# Patient Record
Sex: Female | Born: 1935 | Race: White | Hispanic: No | Smoking: Never smoker
Health system: Southern US, Community
[De-identification: ages and names within clinical notes are randomized; demographics above are authoritative.]

## PROBLEM LIST (undated history)

## (undated) DIAGNOSIS — I10 Essential (primary) hypertension: Secondary | ICD-10-CM

## (undated) DIAGNOSIS — D649 Anemia, unspecified: Secondary | ICD-10-CM

## (undated) DIAGNOSIS — M1990 Unspecified osteoarthritis, unspecified site: Secondary | ICD-10-CM

## (undated) DIAGNOSIS — E785 Hyperlipidemia, unspecified: Secondary | ICD-10-CM

## (undated) DIAGNOSIS — I4891 Unspecified atrial fibrillation: Secondary | ICD-10-CM

## (undated) DIAGNOSIS — Z8719 Personal history of other diseases of the digestive system: Secondary | ICD-10-CM

## (undated) DIAGNOSIS — K219 Gastro-esophageal reflux disease without esophagitis: Secondary | ICD-10-CM

## (undated) DIAGNOSIS — R011 Cardiac murmur, unspecified: Secondary | ICD-10-CM

## (undated) DIAGNOSIS — R251 Tremor, unspecified: Secondary | ICD-10-CM

## (undated) HISTORY — DX: Tremor, unspecified: R25.1

## (undated) HISTORY — PX: JOINT REPLACEMENT: SHX530

## (undated) HISTORY — DX: Anemia, unspecified: D64.9

## (undated) HISTORY — DX: Cardiac murmur, unspecified: R01.1

## (undated) HISTORY — PX: TUBAL LIGATION: SHX77

## (undated) HISTORY — DX: Unspecified osteoarthritis, unspecified site: M19.90

## (undated) HISTORY — DX: Personal history of other diseases of the digestive system: Z87.19

---

## 2005-03-22 ENCOUNTER — Ambulatory Visit: Payer: Self-pay | Admitting: Obstetrics and Gynecology

## 2006-03-27 ENCOUNTER — Ambulatory Visit: Payer: Self-pay | Admitting: Family Medicine

## 2007-04-02 ENCOUNTER — Ambulatory Visit: Payer: Self-pay | Admitting: Family Medicine

## 2008-03-16 ENCOUNTER — Emergency Department: Payer: Self-pay | Admitting: Emergency Medicine

## 2008-03-23 ENCOUNTER — Ambulatory Visit: Payer: Self-pay | Admitting: Unknown Physician Specialty

## 2008-04-02 ENCOUNTER — Ambulatory Visit: Payer: Self-pay | Admitting: Family Medicine

## 2009-04-05 ENCOUNTER — Ambulatory Visit: Payer: Self-pay | Admitting: Family Medicine

## 2009-10-11 ENCOUNTER — Inpatient Hospital Stay: Payer: Self-pay | Admitting: Internal Medicine

## 2010-04-06 ENCOUNTER — Ambulatory Visit: Payer: Self-pay | Admitting: Family Medicine

## 2011-02-18 ENCOUNTER — Emergency Department: Payer: Self-pay | Admitting: Emergency Medicine

## 2011-05-17 ENCOUNTER — Ambulatory Visit: Payer: Self-pay | Admitting: Family Medicine

## 2012-11-04 ENCOUNTER — Ambulatory Visit: Payer: Self-pay | Admitting: Ophthalmology

## 2012-11-04 DIAGNOSIS — I10 Essential (primary) hypertension: Secondary | ICD-10-CM

## 2012-11-04 LAB — POTASSIUM: Potassium: 3.4 mmol/L — ABNORMAL LOW (ref 3.5–5.1)

## 2012-11-13 ENCOUNTER — Ambulatory Visit: Payer: Self-pay | Admitting: Ophthalmology

## 2013-12-03 ENCOUNTER — Ambulatory Visit: Payer: Self-pay | Admitting: General Practice

## 2013-12-03 LAB — BASIC METABOLIC PANEL
ANION GAP: 13 (ref 7–16)
BUN: 23 mg/dL — AB (ref 7–18)
CHLORIDE: 105 mmol/L (ref 98–107)
CO2: 23 mmol/L (ref 21–32)
Calcium, Total: 9.7 mg/dL (ref 8.5–10.1)
Creatinine: 1.18 mg/dL (ref 0.60–1.30)
EGFR (African American): 52 — ABNORMAL LOW
GFR CALC NON AF AMER: 45 — AB
Glucose: 88 mg/dL (ref 65–99)
Osmolality: 284 (ref 275–301)
POTASSIUM: 3.4 mmol/L — AB (ref 3.5–5.1)
Sodium: 141 mmol/L (ref 136–145)

## 2013-12-03 LAB — APTT: Activated PTT: 29.7 secs (ref 23.6–35.9)

## 2013-12-03 LAB — SEDIMENTATION RATE: Erythrocyte Sed Rate: 28 mm/hr (ref 0–30)

## 2013-12-03 LAB — URINALYSIS, COMPLETE
BILIRUBIN, UR: NEGATIVE
Blood: NEGATIVE
GLUCOSE, UR: NEGATIVE mg/dL (ref 0–75)
Ketone: NEGATIVE
Leukocyte Esterase: NEGATIVE
Nitrite: NEGATIVE
PH: 6 (ref 4.5–8.0)
Protein: NEGATIVE
RBC,UR: 1 /HPF (ref 0–5)
Specific Gravity: 1.009 (ref 1.003–1.030)
WBC UR: 3 /HPF (ref 0–5)

## 2013-12-03 LAB — CBC
HCT: 28.8 % — ABNORMAL LOW (ref 35.0–47.0)
HGB: 9.6 g/dL — AB (ref 12.0–16.0)
MCH: 34.1 pg — ABNORMAL HIGH (ref 26.0–34.0)
MCHC: 33.4 g/dL (ref 32.0–36.0)
MCV: 102 fL — ABNORMAL HIGH (ref 80–100)
Platelet: 213 10*3/uL (ref 150–440)
RBC: 2.82 10*6/uL — ABNORMAL LOW (ref 3.80–5.20)
RDW: 15 % — ABNORMAL HIGH (ref 11.5–14.5)
WBC: 8.7 10*3/uL (ref 3.6–11.0)

## 2013-12-03 LAB — PROTIME-INR
INR: 1
Prothrombin Time: 13.2 secs (ref 11.5–14.7)

## 2013-12-03 LAB — MRSA PCR SCREENING

## 2013-12-05 LAB — URINE CULTURE

## 2013-12-09 DIAGNOSIS — E538 Deficiency of other specified B group vitamins: Secondary | ICD-10-CM | POA: Insufficient documentation

## 2013-12-14 ENCOUNTER — Observation Stay: Payer: Self-pay | Admitting: Internal Medicine

## 2013-12-14 LAB — CBC
HCT: 30.1 % — ABNORMAL LOW (ref 35.0–47.0)
HGB: 10.1 g/dL — ABNORMAL LOW (ref 12.0–16.0)
MCH: 34.4 pg — AB (ref 26.0–34.0)
MCHC: 33.4 g/dL (ref 32.0–36.0)
MCV: 103 fL — ABNORMAL HIGH (ref 80–100)
Platelet: 250 10*3/uL (ref 150–440)
RBC: 2.92 10*6/uL — ABNORMAL LOW (ref 3.80–5.20)
RDW: 14.7 % — ABNORMAL HIGH (ref 11.5–14.5)
WBC: 6.8 10*3/uL (ref 3.6–11.0)

## 2013-12-14 LAB — COMPREHENSIVE METABOLIC PANEL
ALBUMIN: 3.7 g/dL (ref 3.4–5.0)
AST: 21 U/L (ref 15–37)
Alkaline Phosphatase: 51 U/L
Anion Gap: 10 (ref 7–16)
BUN: 24 mg/dL — AB (ref 7–18)
Bilirubin,Total: 0.5 mg/dL (ref 0.2–1.0)
CO2: 23 mmol/L (ref 21–32)
Calcium, Total: 9.2 mg/dL (ref 8.5–10.1)
Chloride: 96 mmol/L — ABNORMAL LOW (ref 98–107)
Creatinine: 1.62 mg/dL — ABNORMAL HIGH (ref 0.60–1.30)
EGFR (African American): 35 — ABNORMAL LOW
GFR CALC NON AF AMER: 31 — AB
Glucose: 124 mg/dL — ABNORMAL HIGH (ref 65–99)
OSMOLALITY: 264 (ref 275–301)
Potassium: 3.6 mmol/L (ref 3.5–5.1)
SGPT (ALT): 17 U/L (ref 12–78)
Sodium: 129 mmol/L — ABNORMAL LOW (ref 136–145)
TOTAL PROTEIN: 7.4 g/dL (ref 6.4–8.2)

## 2013-12-14 LAB — URINALYSIS, COMPLETE
BLOOD: NEGATIVE
Bacteria: NONE SEEN
Bilirubin,UR: NEGATIVE
GLUCOSE, UR: NEGATIVE mg/dL (ref 0–75)
Leukocyte Esterase: NEGATIVE
NITRITE: NEGATIVE
PROTEIN: NEGATIVE
Ph: 6 (ref 4.5–8.0)
Specific Gravity: 1.012 (ref 1.003–1.030)
Squamous Epithelial: 1
WBC UR: 1 /HPF (ref 0–5)

## 2013-12-14 LAB — PROTIME-INR
INR: 1
Prothrombin Time: 12.8 secs (ref 11.5–14.7)

## 2013-12-14 LAB — TROPONIN I
Troponin-I: 0.03 ng/mL
Troponin-I: 0.03 ng/mL

## 2013-12-14 LAB — TSH: Thyroid Stimulating Horm: 3.78 u[IU]/mL

## 2013-12-14 LAB — PRO B NATRIURETIC PEPTIDE: B-Type Natriuretic Peptide: 468 pg/mL — ABNORMAL HIGH (ref 0–450)

## 2013-12-14 LAB — MAGNESIUM: Magnesium: 1.3 mg/dL — ABNORMAL LOW

## 2013-12-15 LAB — CBC WITH DIFFERENTIAL/PLATELET
BASOS ABS: 0 10*3/uL (ref 0.0–0.1)
Basophil %: 0.4 %
EOS PCT: 0.5 %
Eosinophil #: 0 10*3/uL (ref 0.0–0.7)
HCT: 25 % — ABNORMAL LOW (ref 35.0–47.0)
HGB: 8.7 g/dL — ABNORMAL LOW (ref 12.0–16.0)
LYMPHS PCT: 27.8 %
Lymphocyte #: 1.3 10*3/uL (ref 1.0–3.6)
MCH: 35.6 pg — ABNORMAL HIGH (ref 26.0–34.0)
MCHC: 34.9 g/dL (ref 32.0–36.0)
MCV: 102 fL — ABNORMAL HIGH (ref 80–100)
Monocyte #: 1 x10 3/mm — ABNORMAL HIGH (ref 0.2–0.9)
Monocyte %: 21.7 %
Neutrophil #: 2.4 10*3/uL (ref 1.4–6.5)
Neutrophil %: 49.6 %
PLATELETS: 211 10*3/uL (ref 150–440)
RBC: 2.45 10*6/uL — ABNORMAL LOW (ref 3.80–5.20)
RDW: 14.3 % (ref 11.5–14.5)
WBC: 4.8 10*3/uL (ref 3.6–11.0)

## 2013-12-15 LAB — BASIC METABOLIC PANEL
ANION GAP: 10 (ref 7–16)
BUN: 15 mg/dL (ref 7–18)
CO2: 21 mmol/L (ref 21–32)
Calcium, Total: 8.4 mg/dL — ABNORMAL LOW (ref 8.5–10.1)
Chloride: 104 mmol/L (ref 98–107)
Creatinine: 1.04 mg/dL (ref 0.60–1.30)
EGFR (African American): >60
EGFR (Non-African Amer.): 52 — ABNORMAL LOW
Glucose: 86 mg/dL (ref 65–99)
OSMOLALITY: 270 (ref 275–301)
Potassium: 3.7 mmol/L (ref 3.5–5.1)
Sodium: 135 mmol/L — ABNORMAL LOW (ref 136–145)

## 2013-12-15 LAB — PHOSPHORUS: Phosphorus: 2.9 mg/dL (ref 2.5–4.9)

## 2013-12-15 LAB — MAGNESIUM: Magnesium: 2.2 mg/dL

## 2013-12-15 LAB — URINE CULTURE

## 2013-12-15 LAB — FOLATE: FOLIC ACID: 32.1 ng/mL (ref 3.1–100.0)

## 2013-12-16 LAB — CBC WITH DIFFERENTIAL/PLATELET
BASOS PCT: 0.3 %
Basophil #: 0 10*3/uL (ref 0.0–0.1)
Eosinophil #: 0 10*3/uL (ref 0.0–0.7)
Eosinophil %: 0.2 %
HCT: 25.3 % — ABNORMAL LOW (ref 35.0–47.0)
HGB: 8.6 g/dL — ABNORMAL LOW (ref 12.0–16.0)
Lymphocyte #: 1 10*3/uL (ref 1.0–3.6)
Lymphocyte %: 15.8 %
MCH: 35.3 pg — ABNORMAL HIGH (ref 26.0–34.0)
MCHC: 33.8 g/dL (ref 32.0–36.0)
MCV: 104 fL — ABNORMAL HIGH (ref 80–100)
MONO ABS: 1.2 x10 3/mm — AB (ref 0.2–0.9)
Monocyte %: 18.1 %
Neutrophil #: 4.2 10*3/uL (ref 1.4–6.5)
Neutrophil %: 65.6 %
PLATELETS: 206 10*3/uL (ref 150–440)
RBC: 2.43 10*6/uL — ABNORMAL LOW (ref 3.80–5.20)
RDW: 14.9 % — ABNORMAL HIGH (ref 11.5–14.5)
WBC: 6.4 10*3/uL (ref 3.6–11.0)

## 2013-12-16 LAB — OCCULT BLOOD X 1 CARD TO LAB, STOOL: Occult Blood, Feces: POSITIVE

## 2013-12-16 LAB — BASIC METABOLIC PANEL
ANION GAP: 9 (ref 7–16)
BUN: 15 mg/dL (ref 7–18)
CO2: 22 mmol/L (ref 21–32)
Calcium, Total: 9 mg/dL (ref 8.5–10.1)
Chloride: 101 mmol/L (ref 98–107)
Creatinine: 0.91 mg/dL (ref 0.60–1.30)
EGFR (Non-African Amer.): >60
Glucose: 92 mg/dL (ref 65–99)
Osmolality: 265 (ref 275–301)
POTASSIUM: 3.9 mmol/L (ref 3.5–5.1)
Sodium: 132 mmol/L — ABNORMAL LOW (ref 136–145)

## 2013-12-16 LAB — RETICULOCYTES
ABSOLUTE RETIC COUNT: 0.0453 10*6/uL (ref 0.019–0.186)
Reticulocyte: 1.84 % (ref 0.4–3.1)

## 2013-12-19 LAB — CULTURE, BLOOD (SINGLE)

## 2014-01-01 ENCOUNTER — Ambulatory Visit: Payer: Self-pay | Admitting: Gastroenterology

## 2014-01-13 ENCOUNTER — Ambulatory Visit: Payer: Self-pay | Admitting: Gastroenterology

## 2014-01-16 ENCOUNTER — Ambulatory Visit: Payer: Self-pay | Admitting: Gastroenterology

## 2014-04-14 ENCOUNTER — Ambulatory Visit: Payer: Self-pay | Admitting: General Practice

## 2014-04-14 LAB — BASIC METABOLIC PANEL
Anion Gap: 8 (ref 7–16)
BUN: 27 mg/dL — ABNORMAL HIGH (ref 7–18)
CALCIUM: 9.5 mg/dL (ref 8.5–10.1)
CO2: 27 mmol/L (ref 21–32)
Chloride: 107 mmol/L (ref 98–107)
Creatinine: 0.8 mg/dL (ref 0.60–1.30)
GLUCOSE: 89 mg/dL (ref 65–99)
OSMOLALITY: 288 (ref 275–301)
Potassium: 3.5 mmol/L (ref 3.5–5.1)
SODIUM: 142 mmol/L (ref 136–145)

## 2014-04-14 LAB — URINALYSIS, COMPLETE
Bacteria: NONE SEEN
Bilirubin,UR: NEGATIVE
Blood: NEGATIVE
Glucose,UR: NEGATIVE mg/dL (ref 0–75)
KETONE: NEGATIVE
LEUKOCYTE ESTERASE: NEGATIVE
Nitrite: NEGATIVE
PH: 5 (ref 4.5–8.0)
PROTEIN: NEGATIVE
RBC,UR: 2 /HPF (ref 0–5)
SPECIFIC GRAVITY: 1.016 (ref 1.003–1.030)
WBC UR: <1 /HPF (ref 0–5)

## 2014-04-14 LAB — CBC
HCT: 31.2 % — AB (ref 35.0–47.0)
HGB: 10.3 g/dL — AB (ref 12.0–16.0)
MCH: 35.5 pg — ABNORMAL HIGH (ref 26.0–34.0)
MCHC: 33.2 g/dL (ref 32.0–36.0)
MCV: 107 fL — ABNORMAL HIGH (ref 80–100)
Platelet: 259 10*3/uL (ref 150–440)
RBC: 2.91 10*6/uL — ABNORMAL LOW (ref 3.80–5.20)
RDW: 17.3 % — ABNORMAL HIGH (ref 11.5–14.5)
WBC: 9.3 10*3/uL (ref 3.6–11.0)

## 2014-04-14 LAB — MRSA PCR SCREENING

## 2014-04-14 LAB — SEDIMENTATION RATE: ERYTHROCYTE SED RATE: 59 mm/h — AB (ref 0–30)

## 2014-04-14 LAB — PROTIME-INR
INR: 0.9
Prothrombin Time: 12.5 secs (ref 11.5–14.7)

## 2014-04-14 LAB — APTT: ACTIVATED PTT: 27.3 s (ref 23.6–35.9)

## 2014-04-16 LAB — URINE CULTURE

## 2014-04-27 ENCOUNTER — Inpatient Hospital Stay: Payer: Self-pay | Admitting: General Practice

## 2014-04-27 LAB — CBC WITH DIFFERENTIAL/PLATELET
Basophil #: 0 10*3/uL (ref 0.0–0.1)
Basophil %: 0.1 %
Eosinophil #: 0 10*3/uL (ref 0.0–0.7)
Eosinophil %: 0 %
HCT: 25 % — ABNORMAL LOW (ref 35.0–47.0)
HGB: 8.3 g/dL — AB (ref 12.0–16.0)
LYMPHS PCT: 2.3 %
Lymphocyte #: 0.3 10*3/uL — ABNORMAL LOW (ref 1.0–3.6)
MCH: 35.5 pg — AB (ref 26.0–34.0)
MCHC: 33 g/dL (ref 32.0–36.0)
MCV: 108 fL — ABNORMAL HIGH (ref 80–100)
MONO ABS: 1.8 x10 3/mm — AB (ref 0.2–0.9)
MONOS PCT: 15.2 %
NEUTROS ABS: 9.7 10*3/uL — AB (ref 1.4–6.5)
NEUTROS PCT: 82.4 %
Platelet: 128 10*3/uL — ABNORMAL LOW (ref 150–440)
RBC: 2.33 10*6/uL — ABNORMAL LOW (ref 3.80–5.20)
RDW: 17 % — AB (ref 11.5–14.5)
WBC: 11.8 10*3/uL — ABNORMAL HIGH (ref 3.6–11.0)

## 2014-04-27 LAB — BASIC METABOLIC PANEL
ANION GAP: 7 (ref 7–16)
BUN: 19 mg/dL — ABNORMAL HIGH (ref 7–18)
CREATININE: 0.76 mg/dL (ref 0.60–1.30)
Calcium, Total: 8 mg/dL — ABNORMAL LOW (ref 8.5–10.1)
Chloride: 108 mmol/L — ABNORMAL HIGH (ref 98–107)
Co2: 25 mmol/L (ref 21–32)
GLUCOSE: 134 mg/dL — AB (ref 65–99)
Osmolality: 284 (ref 275–301)
POTASSIUM: 3.9 mmol/L (ref 3.5–5.1)
Sodium: 140 mmol/L (ref 136–145)

## 2014-04-27 LAB — MAGNESIUM: Magnesium: 1.2 mg/dL — ABNORMAL LOW

## 2014-04-28 LAB — PLATELET COUNT: Platelet: 135 10*3/uL — ABNORMAL LOW (ref 150–440)

## 2014-04-28 LAB — BASIC METABOLIC PANEL
ANION GAP: 8 (ref 7–16)
BUN: 16 mg/dL (ref 7–18)
CALCIUM: 8 mg/dL — AB (ref 8.5–10.1)
CO2: 25 mmol/L (ref 21–32)
CREATININE: 0.61 mg/dL (ref 0.60–1.30)
Chloride: 104 mmol/L (ref 98–107)
EGFR (African American): >60
GLUCOSE: 99 mg/dL (ref 65–99)
Osmolality: 275 (ref 275–301)
Potassium: 3.9 mmol/L (ref 3.5–5.1)
Sodium: 137 mmol/L (ref 136–145)

## 2014-04-28 LAB — HEPATIC FUNCTION PANEL A (ARMC)
ALBUMIN: 2.9 g/dL — AB (ref 3.4–5.0)
Alkaline Phosphatase: 44 U/L — ABNORMAL LOW
Bilirubin,Total: 0.2 mg/dL (ref 0.2–1.0)
SGOT(AST): 26 U/L (ref 15–37)
SGPT (ALT): 19 U/L
TOTAL PROTEIN: 5.9 g/dL — AB (ref 6.4–8.2)

## 2014-04-28 LAB — HEMOGLOBIN: HGB: 8.1 g/dL — ABNORMAL LOW (ref 12.0–16.0)

## 2014-04-28 LAB — MAGNESIUM: MAGNESIUM: 1.8 mg/dL

## 2014-04-28 LAB — FOLATE: FOLIC ACID: 35 ng/mL (ref 3.1–100.0)

## 2014-04-29 LAB — CBC WITH DIFFERENTIAL/PLATELET
Basophil #: 0 10*3/uL (ref 0.0–0.1)
Basophil %: 0.2 %
EOS ABS: 0 10*3/uL (ref 0.0–0.7)
EOS PCT: 0.2 %
HCT: 25.3 % — AB (ref 35.0–47.0)
HGB: 8.4 g/dL — ABNORMAL LOW (ref 12.0–16.0)
LYMPHS PCT: 8.1 %
Lymphocyte #: 0.9 10*3/uL — ABNORMAL LOW (ref 1.0–3.6)
MCH: 35.7 pg — ABNORMAL HIGH (ref 26.0–34.0)
MCHC: 33 g/dL (ref 32.0–36.0)
MCV: 108 fL — AB (ref 80–100)
Monocyte #: 3.6 x10 3/mm — ABNORMAL HIGH (ref 0.2–0.9)
Monocyte %: 31.9 %
Neutrophil #: 6.8 10*3/uL — ABNORMAL HIGH (ref 1.4–6.5)
Neutrophil %: 59.6 %
PLATELETS: 149 10*3/uL — AB (ref 150–440)
RBC: 2.34 10*6/uL — AB (ref 3.80–5.20)
RDW: 17.5 % — ABNORMAL HIGH (ref 11.5–14.5)
WBC: 11.4 10*3/uL — ABNORMAL HIGH (ref 3.6–11.0)

## 2014-04-29 LAB — BASIC METABOLIC PANEL
ANION GAP: 7 (ref 7–16)
BUN: 10 mg/dL (ref 7–18)
CALCIUM: 8.3 mg/dL — AB (ref 8.5–10.1)
Chloride: 106 mmol/L (ref 98–107)
Co2: 26 mmol/L (ref 21–32)
Creatinine: 0.65 mg/dL (ref 0.60–1.30)
EGFR (Non-African Amer.): >60
Glucose: 94 mg/dL (ref 65–99)
Osmolality: 276 (ref 275–301)
Potassium: 3.7 mmol/L (ref 3.5–5.1)
Sodium: 139 mmol/L (ref 136–145)

## 2014-04-30 LAB — CBC WITH DIFFERENTIAL/PLATELET
Basophil #: 0 10*3/uL (ref 0.0–0.1)
Basophil %: 0.4 %
EOS ABS: 0 10*3/uL (ref 0.0–0.7)
Eosinophil %: 0.1 %
HCT: 24.5 % — AB (ref 35.0–47.0)
HGB: 8 g/dL — ABNORMAL LOW (ref 12.0–16.0)
Lymphocyte #: 1.1 10*3/uL (ref 1.0–3.6)
Lymphocyte %: 9.4 %
MCH: 35.4 pg — AB (ref 26.0–34.0)
MCHC: 32.6 g/dL (ref 32.0–36.0)
MCV: 109 fL — AB (ref 80–100)
MONO ABS: 3.5 x10 3/mm — AB (ref 0.2–0.9)
Monocyte %: 31.3 %
NEUTROS ABS: 6.6 10*3/uL — AB (ref 1.4–6.5)
NEUTROS PCT: 58.8 %
Platelet: 144 10*3/uL — ABNORMAL LOW (ref 150–440)
RBC: 2.26 10*6/uL — AB (ref 3.80–5.20)
RDW: 17.1 % — ABNORMAL HIGH (ref 11.5–14.5)
WBC: 11.3 10*3/uL — ABNORMAL HIGH (ref 3.6–11.0)

## 2014-04-30 LAB — BASIC METABOLIC PANEL
Anion Gap: 9 (ref 7–16)
BUN: 10 mg/dL (ref 7–18)
CHLORIDE: 106 mmol/L (ref 98–107)
Calcium, Total: 8.1 mg/dL — ABNORMAL LOW (ref 8.5–10.1)
Co2: 26 mmol/L (ref 21–32)
Creatinine: 0.64 mg/dL (ref 0.60–1.30)
EGFR (African American): >60
Glucose: 94 mg/dL (ref 65–99)
OSMOLALITY: 280 (ref 275–301)
Potassium: 3.4 mmol/L — ABNORMAL LOW (ref 3.5–5.1)
Sodium: 141 mmol/L (ref 136–145)

## 2014-05-01 LAB — BASIC METABOLIC PANEL
Anion Gap: 3 — ABNORMAL LOW (ref 7–16)
BUN: 16 mg/dL (ref 7–18)
CALCIUM: 8.2 mg/dL — AB (ref 8.5–10.1)
CHLORIDE: 107 mmol/L (ref 98–107)
CO2: 31 mmol/L (ref 21–32)
Creatinine: 0.86 mg/dL (ref 0.60–1.30)
EGFR (African American): >60
EGFR (Non-African Amer.): >60
Glucose: 91 mg/dL (ref 65–99)
OSMOLALITY: 282 (ref 275–301)
POTASSIUM: 4 mmol/L (ref 3.5–5.1)
SODIUM: 141 mmol/L (ref 136–145)

## 2014-05-01 LAB — HEMOGLOBIN: HGB: 8.9 g/dL — AB (ref 12.0–16.0)

## 2014-05-01 LAB — PLATELET COUNT: PLATELETS: 194 10*3/uL (ref 150–440)

## 2014-05-01 LAB — MAGNESIUM: MAGNESIUM: 2.1 mg/dL

## 2014-05-02 LAB — BASIC METABOLIC PANEL
ANION GAP: 5 — AB (ref 7–16)
BUN: 14 mg/dL (ref 7–18)
CALCIUM: 8.2 mg/dL — AB (ref 8.5–10.1)
CREATININE: 0.86 mg/dL (ref 0.60–1.30)
Chloride: 106 mmol/L (ref 98–107)
Co2: 30 mmol/L (ref 21–32)
EGFR (African American): >60
EGFR (Non-African Amer.): >60
Glucose: 83 mg/dL (ref 65–99)
OSMOLALITY: 281 (ref 275–301)
POTASSIUM: 3.7 mmol/L (ref 3.5–5.1)
SODIUM: 141 mmol/L (ref 136–145)

## 2014-05-02 LAB — CBC WITH DIFFERENTIAL/PLATELET
Basophil #: 0 10*3/uL (ref 0.0–0.1)
Basophil %: 0.3 %
EOS ABS: 0.1 10*3/uL (ref 0.0–0.7)
Eosinophil %: 1 %
HCT: 23.9 % — AB (ref 35.0–47.0)
HGB: 8.2 g/dL — ABNORMAL LOW (ref 12.0–16.0)
LYMPHS ABS: 1.3 10*3/uL (ref 1.0–3.6)
Lymphocyte %: 16.2 %
MCH: 36 pg — ABNORMAL HIGH (ref 26.0–34.0)
MCHC: 34.1 g/dL (ref 32.0–36.0)
MCV: 105 fL — AB (ref 80–100)
MONO ABS: 2.5 x10 3/mm — AB (ref 0.2–0.9)
Monocyte %: 30.1 %
Neutrophil #: 4.4 10*3/uL (ref 1.4–6.5)
Neutrophil %: 52.4 %
Platelet: 212 10*3/uL (ref 150–440)
RBC: 2.27 10*6/uL — AB (ref 3.80–5.20)
RDW: 17.1 % — ABNORMAL HIGH (ref 11.5–14.5)
WBC: 8.4 10*3/uL (ref 3.6–11.0)

## 2014-05-03 ENCOUNTER — Encounter: Payer: Self-pay | Admitting: Internal Medicine

## 2014-05-05 ENCOUNTER — Encounter: Payer: Self-pay | Admitting: Internal Medicine

## 2014-06-05 ENCOUNTER — Encounter: Payer: Self-pay | Admitting: Internal Medicine

## 2014-09-25 NOTE — Op Note (Signed)
PATIENT NAME:  Vickie Howard, Vickie Howard MR#:  [id number] DATE OF BIRTH:  22-Sep-1935  DATE OF PROCEDURE:  11/13/2012  PREOPERATIVE DIAGNOSIS:  Senile cataract left eye.  POSTOPERATIVE DIAGNOSIS:  Senile cataract left eye.  PROCEDURE:  Phacoemulsification with posterior chamber intraocular lens implantation of the left eye.  LENS: ZCBOO 21.5-diopter posterior chamber intraocular lens.  ULTRASOUND TIME:  15% of 1 minute.  CDE 9.3   SURGEON:  Mali Dee Paden, MD  ANESTHESIA:  Topical with tetracaine drops and 2% Xylocaine jelly.  COMPLICATIONS:  None.  DESCRIPTION OF PROCEDURE:  The patient was identified in the holding room and transported to the operating room and placed in the supine position under the operating microscope.  The left eye was identified as the operative eye and it was prepped and draped in the usual sterile ophthalmic fashion.  A 1 millimeter clear-corneal paracentesis was made at the 1:30 position.  The anterior chamber was filled with Viscoat viscoelastic.  A 2.4 millimeter keratome was used to make a near-clear corneal incision at the 10:30 position.  A curvilinear capsulorrhexis was made with a cystotome and capsulorrhexis forceps.  Balanced salt solution was used to hydrodissect and hydrodelineate the nucleus.  Phacoemulsification was then used in stop and chop fashion to remove the lens nucleus and epinucleus.  The remaining cortex was then removed using the irrigation and aspiration handpiece. Provisc was then placed into the capsular bag to distend it for lens placement.  A ZCBOO 21.5-diopter lens was then injected into the capsular bag.  The remaining viscoelastic was aspirated.  Wounds were hydrated with balanced salt solution.  The anterior chamber was inflated to a physiologic pressure with balanced salt solution.  0.1 mL of cefuroxime 10 mg/mL were injected into the anterior chamber for a dose of 1 mg of intracameral antibiotic at the completion of the case. Miostat was  placed into the anterior chamber to constrict the pupil.  No wound leaks were noted.  Topical Vigamox drops and Maxitrol ointment were applied to the eye.  The patient was taken to the recovery room in stable condition without complications of anesthesia or surgery.   ____________________________ Wyonia Hough, MD crb:OSi D: 11/13/2012 13:03:13 ET T: 11/13/2012 14:54:05 ET JOB#: [id number]  cc: Wyonia Hough, MD, <Dictator> Leandrew Koyanagi MD ELECTRONICALLY SIGNED 11/20/2012 12:08

## 2014-09-26 NOTE — Op Note (Signed)
PATIENT NAME:  Vickie Howard, Vickie Howard MR#:  [id number] DATE OF BIRTH:  1935-11-24  DATE OF PROCEDURE:  04/27/2014  PREOPERATIVE DIAGNOSIS: Degenerative arthrosis of the right hip (primary).   POSTOPERATIVE DIAGNOSIS: Degenerative arthrosis of the right hip (primary).   PROCEDURE PERFORMED: Right total hip arthroplasty.   SURGEON: Skip Estimable, MD.   ASSISTANT:  Vance Peper, PA (required to maintain retraction throughout the procedure).   ANESTHESIA: Spinal.   ESTIMATED BLOOD LOSS: 75 mL.   FLUIDS REPLACED: 1700 mL of crystalloid.   DRAINS: Two medium drains to Hemovac reservoir.   IMPLANTS UTILIZED:  DePuy 15 mm small stature AML femoral stem, 50 mm outer diameter Pinnacle 100 acetabular shell, neutral Pinnacle AltrX polyethylene liner, and a 32 mm cobalt chrome hip ball with a + 1 mm neck length.   INDICATIONS FOR SURGERY: The patient is a 79 year old female who has been seen for complaints of progressive right hip and groin pain. X-rays demonstrated severe degenerative changes. After discussion of the risks and benefits of surgical intervention, the patient expressed understanding of the risks and benefits and agreed with plans for surgical intervention.   PROCEDURE IN DETAIL: The patient was brought into the operating room and, after adequate spinal anesthesia was achieved, the patient was placed in the left lateral decubitus position. Axillary roll was placed and all bony prominences were well padded. The patient's right hip and leg were cleaned and prepped with alcohol and DuraPrep and draped in the usual sterile fashion. A "timeout" was performed as per usual protocol. A lateral curvilinear incision was made gently curving towards the posterior superior iliac spine. IT band was incised in line with the skin incision and the fibers of the gluteus maximus were split in line. Piriformis tendon was identified and skeletonized, then incised at its insertion on the proximal femur and reflected  posteriorly. In a similar fashion, the short external rotators were incised and reflected posteriorly. A T-type posterior capsulotomy was performed. Prior to dislocation of the femoral head, a threaded Steinmann pin was inserted through a separate stab incision into the pelvis superior to the acetabulum, then bent in the form of a stylus so as to assess limb length and hip offset throughout the procedure. The femoral head was then dislocated posteriorly. Inspection of the head demonstrated severe degenerative changes with full-thickness loss of articular cartilage and prominent osteophytes noted. The femoral neck cut was performed using an oscillating saw. The anterior capsule was elevated off of the femoral neck. Inspection of the acetabulum also demonstrated significant degenerative changes. Remnant of the acetabular labrum was excised. The acetabulum was then reamed in a sequential fashion up to a 49 mm diameter. Good punctate bleeding bone was encountered. A 50 mm outer diameter Pinnacle 100 acetabular component was positioned and impacted into place. Good scratch fit was appreciated. A + 4 mm polyethylene trial was inserted and attention was directed to the proximal femur. Pilot hole for reaming of the proximal femoral canal was created using a high-speed burr. Proximal femoral canal was reamed in a sequential fashion up to a 14.5 mm diameter. This allowed for approximately 6.5-7 cm of scratch fit. The proximal femur was then prepared using a 15 mm aggressive side-biting reamer. Serial broaches were inserted up to a 15 mm small stature broach. The calcar region was planed and trial reduction was attempted with a 32 mm hip ball with a + 1 mm neck length. The leg was felt to be too long and the + 4 mm polyethylene  was replaced with a neutral polyethylene trial. The 15 mm broach was countersunk and calcar planer was used to resect additional bone. Trial reduction was again performed with a 32 mm hip ball with a +  1 mm neck length. This allowed for good equalization of limb lengths and improved hip offset. Excellent stability was noted both anteriorly and posteriorly. Trial components were removed. The acetabular shell was irrigated with copious amounts of normal saline, then suctioned dry. A neutral Pinnacle AltrX polyethylene was positioned and impacted into place. Attention was then directed to the proximal femur. It was elected to ream line to line and the 15 mm reamer was advanced by hand with excellent purchase noted over approximately 6.5-7 cm.  The 15 mm small stature AML femoral component was positioned and impacted into place. Excellent scratch fit was appreciated. The Morse taper was cleaned and dried and a 32 mm cobalt chrome hip ball with a + 1 mm neck length was placed on the trunnion and impacted into place. The hip was then reduced and placed through a range of motion. Good equalization of limb lengths was noted and improved hip offset was noted. Excellent stability was noted both anteriorly and posteriorly.   The wound was irrigated with copious amounts of normal saline with antibiotic solution using pulsatile lavage and then suctioned dry. The posterior capsule was repaired using number 5 Ethibond. The piriformis tendon was reapproximated on the surface of the gluteus medius tendon using number 5 Ethibond. Two medium drains were placed in the wound bed and brought out through a separate stab incision to be attached to a Hemovac reservoir. IT band was repaired using interrupted sutures of number 1 Vicryl. The subcutaneous tissue was approximated in layers using first number 0 Vicryl followed by 2-0 Vicryl. Skin was closed with skin staples. Sterile dressing was applied.   The patient tolerated the procedure well. She was transported to the recovery room in stable condition.   ____________________________ Laurice Record. Holley Bouche., MD jph:bu D: 04/27/2014 15:48:56 ET T: 04/27/2014 16:22:25  ET JOB#: [id number]  cc: Laurice Record. Holley Bouche., MD, <Dictator> Laurice Record Holley Bouche MD ELECTRONICALLY SIGNED 04/29/2014 17:47

## 2014-09-26 NOTE — Discharge Summary (Signed)
PATIENT NAME:  Vickie Howard, Vickie Howard MR#:  [id number] DATE OF BIRTH:  1935/08/14  DATE OF ADMISSION:  12/14/2013 DATE OF DISCHARGE:  12/16/2013  FINAL DIAGNOSES: 1.  Atrial fibrillation with rapid ventricular rate.  2.  Anemia.  3.  Heme positive stool, concerning for acute blood loss anemia.  4.  Degenerative joint disease with the patient anticipating hip replacement surgery in the near future.  5.  Hypertension.  6.  Hyperlipidemia.  7.  Gastroesophageal reflux disease.   HISTORY AND PHYSICAL: Please see dictated admission history and physical.   HOSPITAL COURSE: The patient was admitted with atrial fibrillation with rapid ventricular rate. She was noted to have hyponatremia, hypomagnesemia, and these were corrected. Her rate switched back into normal sinus rhythm and remained in normal sinus rhythm throughout the rest of her hospitalization, and her dose of medication had to be reduced to prevent hypotension and significant bradycardia.   She ambulated with physical therapy, and was limited by hip pain, but did not have chest pain, shortness of breath of great significance with ambulation.   She was noted to have anemia on arrival, with hemoglobin level falling to approximately 8.5, without gross bleeding noted. Stool was heme positive. Cardiology saw the patient, echocardiogram was performed, which revealed preserved LV function, and they recommended full anticoagulation; however, given the finding of heme positive stool, upcoming surgery, this will need to be investigated further. The challenges of all this was discussed at length with the patient and her family members.   At this time, the patient wants to go home, and appears stable to do so. She will be discharged home in stable condition with physical activity to be up as tolerated. She will follow a low sodium diet, and will use Ensure 2 times per day. She will follow up in our office within the next 1 to 2 weeks, follow-up with  cardiology in the next 1 to 2 weeks and follow up with GI at first available appointment. Home health nursing and physical therapy ordered for the patient as well. At this point, we will only use aspirin for anticoagulation out of concern about possible worsening GI bleeding if we use anything stronger, and the risks of this were discussed with the family members and the patient, and they were accepting of this.   DISCHARGE MEDICATIONS: 1.  Advanced eye health 1 capsule b.i.d.  2.  Vitamin D 50,000 units p.o. q. week.  3.  Omeprazole 20 mg p.o. b.i.d.  4.  Tramadol 50 mg p.o. q. 6 hours as needed as needed for pain, which she tolerated in the hospital.  5.  Cardizem 30 mg p.o. b.i.d.  6.  Aspirin 81 mg p.o. daily.  7.  Atorvastatin 20 mg p.o. daily. 8.  Ensure 1 can p.o. b.i.d.   The patient was given instructions to hold Diovan/HCTZ. Hold simvastatin as well as this has been replaced by atorvastatin.  ____________________________ Adin Hector, MD bjk:sb D: 12/16/2013 08:20:13 ET T: 12/16/2013 08:45:37 ET JOB#: [id number]  cc: [name], MD, <Dictator> Dwayne D. Clayborn Bigness, MD [place] MD ELECTRONICALLY SIGNED 12/17/2013 7:51

## 2014-09-26 NOTE — H&P (Signed)
PATIENT NAME:  Vickie Howard, NO MR#:  [id number] DATE OF BIRTH:  1936-05-31  DATE OF PLANNED ADMISSION:  12/14/2013.  CHIEF COMPLAINT: Rapid atrial fibrillation and chest pain.    HISTORY OF PRESENT ILLNESS:  Vickie Howard is a pleasant 79 year old female, a long-time patient of Dr. Domenick Gong, recently switched to Dr. Ramonita Lab with Dr. Nunzio Cory retirement. She was in her usual state of health. This morning she woke up with a very rapid heart rate. She then called her daughter, who called EMS. She had some chest pressure sensation during this episode. She came to the Emergency Room and was found to be in rapid atrial fibrillation. Diltiazem had been given. Her heart rate slowed remarkably with that, but she became relatively hypotensive at that time. She is feeling chilled now that she is in the Emergency Room, was not particularly chilled prior to that. Her creatinine was found to be elevated at 1.6, it had been normal previously. She was out shopping yesterday with her daughter, notably. She does have some hip pain and was planning on having a total hip replacement later this week.   REVIEW OF SYSTEMS: Negative other than above.   PAST MEDICAL HISTORY:  1.  Hypertension.  2.  Hypercholesterolemia.  3.  Osteoarthritis.  4.  History of esophageal stricture.  5.  GERD.   PAST SURGICAL HISTORY: Esophageal dilation, umbilical hernia, tubal ligation.   ALLERGIES: CODEINE.   SOCIAL HISTORY: Lives alone. She is a retired Psychologist, prison and probation services. She has 2 healthy adult children. No tobacco, no alcohol. She has from [facility] originally.   FAMILY HISTORY: Mother died at age 76 from pneumonia. Father had an aneurysm in his abdomen and surgery from that.  One daughter had breast cancer.   MEDICATIONS: Per [facility] med reconciliation system: Vitamin D 50,000 units weekly, simvastatin 20 mg daily, omeprazole 20 mg daily, Diovan/HCT, 320/25, Cipro, p.r.n. Tylenol.   PHYSICAL EXAMINATION:  GENERAL:  Pleasant, elderly female lying in the bed, no acute distress.  VITAL SIGNS: Blood pressure approximately 104/70, pulse 75 and regular at the time of my exam but strips from EMS reportedly show heart rate of approximately 190. Respirations 16. She is afebrile.  HEENT: Normocephalic, atraumatic. TMs clear. Oropharynx clear.  NECK: No bruits, thyromegaly, adenopathy, or jugular venous distention.  CHEST: Clear to auscultation and percussion.  HEART: Regular rhythm without murmurs, rubs or gallops.  ABDOMEN: Soft, flat, nontender without hepatosplenomegaly.  EXTREMITIES:  No edema.  SKIN: No rashes.  NEUROLOGICAL:  Nonfocal.  LYMPHATIC:  No lymphadenopathy.   LABORATORY DATA:  TSH normal, BNP 468, which is minimally elevated, creatinine 1.62, sodium 129, GFR calculated at 31 with that. WBC 6.8, hemoglobin 10.1, normal platelets, MCV of 103, magnesium 1.3, INR is normal. Initial troponin is undetectable.   Chest x-ray shows stable elevation of left hemidiaphragm only.   ASSESSMENT AND PLAN:  1.  Rapid atrial fibrillation with chest pain, initial troponins negative, but we will need to watch her on telemetry to ensure this does not recur. We will give low dose p.o. metoprolol given the relative hypotension. We will have cardiology see her, as well, to help manage and give further work-up. Replace her magnesium, as well as work on her other potential medical problems.  2.  Acute renal failure, likely prerenal, especially with the hyponatremia that is associated with it. We will give her a bolus of 1 liter of saline every 4 hours and then 75 an hour. Will repeat her Met-B in the  morning. She has been given empiric ceftriaxone already. We will await blood cultures and await the urine culture that has been done, as well. No clear sign of infection otherwise.    ____________________________ Ocie Cornfield. Ouida Sills, MD mwa:lt D: 12/14/2013 09:22:45 ET T: 12/14/2013 09:57:47 ET JOB#: [id number]  cc: Ocie Cornfield. Ouida Sills, MD, <Dictator> Kirk Ruths MD ELECTRONICALLY SIGNED 12/14/2013 11:07

## 2014-09-26 NOTE — Consult Note (Signed)
PATIENT NAME:  Vickie Howard, Vickie Howard MR#:  [id number] DATE OF BIRTH:  Oct 28, 1935  DATE OF CONSULTATION:  04/27/2014  REFERRING PHYSICIAN:  Laurice Record. Holley Bouche., MD CONSULTING PHYSICIAN:  Demetrios Loll, MD  PRIMARY CARE PHYSICIAN: [name], MD   REASON FOR CONSULTATION: Atrial fibrillation with RVR today.  HISTORY OF PRESENT ILLNESS: A 79 year old Caucasian female with a history of hypertension, hyperlipidemia, atrial fibrillation. She has a history of degenerative arthritis and is status post right total hip arthroplasty today. After surgery, the patient felt nauseous and sick. She was noticed to have atrial fibrillation with RVR in the 130s. The patient denies any chest pain, palpitation, no orthopnea or nocturnal dyspnea, no leg edema. The patient denies any fever or chills, but has generalized weakness. The patient denies any other symptoms except right hip pain.   PAST MEDICAL HISTORY: Atrial fibrillation, hypertension, hyperlipidemia, osteoarthritis, esophageal stricture, GERD.   PAST SURGICAL HISTORY: Right hip surgery today, esophageal dilation, umbilical hernia repair, tubal ligation.  SOCIAL HISTORY: Lives alone. No alcohol drinking or illicit drugs or smoking.   FAMILY HISTORY: Mother died at the age of 32 from pneumonia. Father had an aneurysm. One daughter has breast cancer.   ALLERGIES: CODEINE, LEVAQUIN.   HOME MEDICATIONS: Tylenol 500 mg p.o. 2 tablets every 6 hours p.r.n., omeprazole 20 mg p.o. daily, Cardizem 30 mg p.o. every 12 hours, atorvastatin 20 mg p.o. daily, aspirin 81 mg p.o. daily, [facility] multivitamin, 1 capsule b.i.d.   REVIEW OF SYSTEMS:  CONSTITUTIONAL: The patient denies any fever or chills. No headache or dizziness, but has weakness.  EYES: No double vision or blurred vision.  EARS, NOSE AND THROAT: No postnasal drip, slurred speech or dysphagia.  CARDIOVASCULAR: No chest pain, palpitation, orthopnea, or nocturnal dyspnea. No leg edema.   PULMONARY: No cough, sputum, shortness of breath or hemoptysis.  GASTROINTESTINAL: No abdominal pain, vomiting or diarrhea. No melena or bloody stool, but has nausea.  UROLOGY: No dysuria, hematuria or incontinence.  SKIN: No rash or jaundice.  NEUROLOGY: No syncope, loss of consciousness or seizure.  ENDOCRINOLOGY: No polyuria, polydipsia, heat or cold intolerance.  HEMATOLOGY: No easy bruising or bleeding.   PHYSICAL EXAMINATION:  VITAL SIGNS: Temperature 94.3, pulse 133, blood pressure 117/74, oxygen saturation 95% on room air.  GENERAL: The patient is alert, awake, oriented, in no acute distress.  HEENT: Pupils round, equal and reactive to light and accommodation. Dry oral mucosa. Clear oropharynx.  NECK: Supple. No JVD or carotid bruit. No lymphadenopathy. No thyromegaly.  CARDIOVASCULAR: S1 and S2, irregular rate and rhythm, tachycardia. A mild systolic murmur.  RESPIRATORY: Bilateral air entry. No wheezing or rales. No use of accessory muscles to breathe.  ABDOMEN: Soft. No distention or tenderness. No organomegaly. Bowel sounds present.  EXTREMITIES: No edema, clubbing or cyanosis. No calf tenderness. Bilateral pedal pulses present.  SKIN: No rash or jaundice.  NEUROLOGIC: A and O x 3. No focal deficit.   LABORATORY DATA: Right hip x-ray showed right total hip joint arthroplasty. No laboratory today. Laboratory is pending.   IMPRESSIONS:  1.  Atrial fibrillation with rapid ventricular response.  2.  Hypertension, controlled.  3.  Hyperlipidemia.  4.  Possible dehydration.  5.  Anemia.   RECOMMENDATIONS: 1.  For atrial fibrillation with rapid ventricular response, we will continue telemetry monitor. The patient was just given Cardizem 90 mg p.o. with digoxin 0.25 mg IV. We will continue to monitor her heart rate. May repeat digoxin if heart rate  is not controlled.  2.  We will request a cardiology consult from Dr. Clayborn Bigness. Since the patient recently had an echocardiograph, we  will not repeat an echocardiograph. We will continue Cardizem p.o. and adjust the dose depending on the patient's heart rate. In addition, continue Lovenox subcutaneous b.i.d.  3.  Hypertension is controlled. Continue Cardizem. 4.  Degenerative arthritis, status post right hip arthroplasty. Pain control and PT.   I discussed the patient's condition and plan of treatment with the patient and the patient's son.   CODE STATUS: The patient wants full code.   TIME SPENT: About 56 minutes.    ____________________________ Demetrios Loll, MD qc:TT D: 04/27/2014 14:04:17 ET T: 04/27/2014 14:36:49 ET JOB#: [id number]  cc: Demetrios Loll, MD, <Dictator> Demetrios Loll MD ELECTRONICALLY SIGNED 04/27/2014 17:42

## 2014-09-26 NOTE — Consult Note (Signed)
PATIENT NAME:  Vickie Howard, Vickie Howard MR#:  [id number] DATE OF BIRTH:  1936-02-01  DATE OF CONSULTATION:  12/15/2013  REFERRING PHYSICIAN:  Frazier Richards, MD  CONSULTING PHYSICIAN:  Tyia Binford D. Ellenor Wisniewski, MD  INDICATION: Rapid atrial fibrillation, preop for hip surgery and murmur.   HISTORY OF PRESENT ILLNESS: This patient is a 79 year old white female patient of Dr. Domenick Gong, recently switched to Dr. Ramonita Lab with Dr. Nunzio Cory retirement. Patient in her usual state of health woke up with rapid atrial fibrillation, heart racing, short of breath. Called her daughter and then EMS came. She had some chest pressure     .  In the Emergency Room, she was found to be in rapid atrial fibrillation. Given diltiazem, heart rate slowed remarkably, but she became slightly hypotensive. She was placed on telemetry. Creatinine was found to be slightly elevated and was normal previously. Patient was in a normal state of health. She shopped the day before. Complained of hip pain and planned to have hip surgery this week with Dr. Marry Guan because of significant chronic arthritis changes. Denies any prior cardiac history. Felt fine otherwise.   REVIEW OF SYSTEMS:  No blackout spells or syncope. No nausea or vomiting. No fever, no chills, no weight loss, no weight gain. No hemoptysis or hematemesis. Denies bright red blood per rectum.   PAST MEDICAL HISTORY:  Hypertension, hyperlipidemia, osteoarthritis, esophageal stricture, GERD.   PAST SURGICAL HISTORY: Esophageal dilatation, umbilical hernia, tubal ligation.   ALLERGIES: CODEINE.   SOCIAL HISTORY: Lives alone. Retired Psychologist, prison and probation services. Has healthy children, 2 grown adults.  No smoking or alcohol consumption. Lived in [place].   FAMILY HISTORY: Mother died of pneumonia. Father had abdominal aortic aneurysm with surgery. Breast cancer in a daughter.  MEDICATIONS:  Vitamin D, simvastatin 20 a day, omeprazole 20 a day, Diovan HCTZ 320/25, Cipro  p.r.n., Tylenol as well.   PHYSICAL EXAMINATION:  VITAL SIGNS: Blood pressure 105/70, pulse 75, respiratory rate of 16, afebrile.  HEENT: Normocephalic, atraumatic. Pupils equal and reactive to light.  NECK: Supple. No significant JVD, bruits or adenopathy.  LUNGS: Clear to auscultation and percussion. No significant wheezing, rhonchi, rales. HEART: Systolic ejection murmur at left sternal border. Positive S4.  The patient had irregularly irregular heartbeat.  ABDOMEN: Benign.  EXTREMITIES: Within normal limits.  NEUROLOGIC: Intact.  SKIN:  within normal limits  LABORATORY DATA:  TSH normal. BNP 468, creatinine 1.62, sodium 129. White count 68, hemoglobin 10.1, normal platelets. MCV was 103, magnesium 1.3.   RADIOLOGICAL DATA:  Chest x-ray unremarkable.   ASSESSMENT: Rapid atrial fibrillation, renal insufficiency, mild dehydration, degenerative joint disease, hyperlipidemia, gastroesophageal reflux disease.   PLAN:  1. Agree with admit. Treat atrial fibrillation with rate control. Currently, she is on diltiazem. Beta blocker or calcium blocker will be sufficient. Would need long-term anticoagulation, I think, because of her elevated [place] score, but short-term aspirin will be okay until she has her hip surgery.   I would probably recommend Eliquis long-term 2.5 twice a day and rate control. Do not recommend antiarrhythmic at this point unless it is found that her atrial fibrillation is too difficult to control.  2. Continue GERD therapy. Omeprazole would be helpful and to be continued.  3. Hyperlipidemia. Continue simvastatin therapy. Lipid and liver studies as per primary.  4. Hypertension. She has been on Diovan, but we will discontinue that in favor of diltiazem or beta blocker. Low-dose 81 mg aspirin will be helpful as well. Follow up renal function with mild  hydration. The patient will follow up with cardiology as an outpatient. Echocardiogram will be helpful in the interim.   Case  discussed with Dr. Caryl Comes.      ____________________________ Loran Senters. Clayborn Bigness, MD ddc:dd D: 12/15/2013 13:18:00 ET T: 12/15/2013 18:11:51 ET JOB#: [id number]  cc: Aniylah Avans D. Clayborn Bigness, MD, <Dictator> Yolonda Kida MD ELECTRONICALLY SIGNED 01/16/2014 12:59

## 2014-09-26 NOTE — Discharge Summary (Signed)
PATIENT NAME:  Vickie Howard, Vickie Howard MR#:  [id number] DATE OF BIRTH:  12-Nov-1935  DATE OF ADMISSION:  04/27/2014 DATE OF DISCHARGE:  05/01/2014.   ADMITTING DIAGNOSIS: Degenerative arthritis of the right hip.   DISCHARGE DIAGNOSIS: Degenerative arthritis of the right hip.   PROCEDURE PERFORMED: Right total hip arthroplasty.   SURGEON: Laurice Record. Hooten, MD   ASSISTANT: Marylee Floras, PA-C   ANESTHESIA: Spinal.   ESTIMATED BLOOD LOSS: 75 mL   FLUIDS REPLACED: 1700 mL of crystalloid.   DRAINS: Two medium drains to Hemovac reservoir.   IMPLANTS UTILIZED: DePuy 15 mm small stature AML femoral stem, 50 mm outer diameter Pinnacle, 100 acetabular shell, neutral Pinnacle ALTRX polyethylene liner and a 32 mm cobalt chrome hip ball with a + 1 mm neck length.   HISTORY: The patient is a 79 year old female was has been seen for complaints of progressive right hip and groin pain. X-rays demonstrated severe degenerative changes. After discussion of the risks and benefits of surgical intervention, the patient expressed understanding of the risks and benefits, and agreed with plans for surgical intervention.   PHYSICAL EXAMINATION:  GENERAL: Well developed, well nourished female in no acute distress. Antalgic gait with evidence of significant abductor lurch.  HEENT: Atraumatic, normocephalic. Pupils are equal, round, and reactive light. Extraocular motion is intact. Sclerae are clear. Oropharynx is clear, with moist mucosa.  NECK: Supple, nontender, with good range of motion. No thyromegaly, adenopathy, JVD or carotid bruits.  LUNGS: Clear to auscultation bilaterally.  CARDIOVASCULAR: Regular rate and rhythm. Normal S1, S2. There is a 3/6 murmur. No appreciable gallops or rubs. Peripheral pulses are palpable, and no lower extremity edema.  ABDOMEN: Soft, nontender, nondistended.  SPINE: Good range of motion with flexion and extension, nontender.  EXTREMITIES: Good strength, stability and range of motion of  the upper extremities. Right Hip: The patient has no pelvic tilt. Leg lengths are equal. There is no soft tissue swelling. No erythema or warmth noted. She is tender over the greater trochanter region. She has moderate pain with axial compression. She has no atrophy of the hip abductors, adductors, or flexors. She is neurovascularly intact in the right lower extremity. She has pain with hip internal rotation.   HOSPITAL COURSE: The patient was admitted to the hospital on 04/27/2014. She had surgery that same day and was brought to the orthopedic floor from the PACU in stable condition. On postoperative day 1, 04/28/2014, the patient was consulted by her PCP for atrial fibrillation. The patient was going in and out of AFib. She was also consulted for acute postoperative anemia on chronic anemia due to GI bleeding. She was rate controlled with Cardizem, and labs were monitored the following day. The patient had some slight confusion, so oxycodone was held. On 04/29/2014, postoperative day 2, dressing was changed, drain was pulled, the patient was doing well. Pain was much better controlled. Confusion was better. Hemoglobin was down to 8.4 and it was brought to our attention that the patient was not having a good appetite, so mirtazapine was added to help increased appetite. On 04/30/2014, the patient's hemoglobin fell to 8.01. One unit of packed red blood cells was given. She did have a low potassium, and this was supplemented. Her atrial fibrillation was improved with rate control. On 05/01/2014, the patient was having PAF, atrial bigeminy 50-60 bmp. Cardiology was consulted. Clinically she was stable and asymptomatic. Cardiology recommended continue with cardizem. Hemoglobin was up to 8.9. She was doing well with physical therapy, and pain  was well controlled. On 05/02/14, patient was stable and ready for discharge to rehab facility.   CONDITION AT DISCHARGE: Stable.   DISCHARGE INSTRUCTIONS: The patient can  increase weight-bearing on the affected extremity, elevate the affected foot or leg on 1-2 pillows with the foot higher than the knee. Posterior hip precautions. Knee-high TED hose on both legs, and remove 1 hour per 8 hour shift. Elevate the heels off the bed. Use incentive spirometer every hour while awake, and encourage cough and deep breathing. The patient may resume a regular diet as tolerated. Do not get the dressing or bandage wet or dirty. Call [facility] if the dressing gets water under it. Change dressing as needed. Remove staples and apply benzoin and Steri-Strips on 05/11/2014. Call [facility] for any bright red bleeding from the incision wound, fever above 101.5 degrees, redness, swelling, or drainage at the incision. Call [facility] if you experience any increased leg pain, numbness, weakness in your legs, or bowel or bladder symptoms. Physical therapy has been arranged per protocol. Occupational therapy. Do not take a shower until staples have been removed. Please treat pain with tramadol and Tylenol. Only use oxycodone if severe pain.   FOLLOWUP APPOINTMENT: The patient is to follow up with [facility] on 06/09/2014 at 9:45 a.m.   DISCHARGE MEDICATIONS: Please see list of discharge medications on the discharge instruction page.    ____________________________ T. Rachelle Hora, PA-C tcg:MT D: 05/01/2014 09:16:03 ET T: 05/01/2014 09:49:10 ET JOB#: [id number]  cc: T. Rachelle Hora, PA-C, <Dictator> Duanne Guess [place] ELECTRONICALLY SIGNED 05/13/2014 12:36

## 2014-09-26 NOTE — Consult Note (Signed)
PATIENT NAME:  Vickie Howard, ANTONETTI MR#:  [id number] DATE OF BIRTH:  [birth year]  DATE OF CONSULTATION:  04/28/2014  REFERRING PHYSICIAN:  Laurice Record. Holley Bouche., MD and Demetrios Loll, MD  CONSULTING PHYSICIAN:  Any Mcneice D. Clayborn Bigness, MD  PRIMARY PHYSICIAN: [name], MD  INDICATION FOR CONSULTATION: Rapid atrial fibrillation status post hip replacement.  HISTORY OF PRESENT ILLNESS: The patient is a 79 year old female with a history of hypertension, hyperlipidemia, rapid atrial fibrillation, paroxysmal. She has degenerative hip disease status post hip replacement by Dr. Marry Guan. Postoperatively the patient had rapid ventricular response and atrial fibrillation, which she has had before, with palpitations. Denies any shortness of breath, chest pain, weakness, or fatigue. She did not really notice much in the way of rapid atrial fibrillation. Cardiology consultation was recommended for further management. Her anticoagulation was stopped prior to surgery.   PAST MEDICAL HISTORY: Atrial fibrillation, hypertension, hyperlipidemia, osteoarthritis, esophageal stricture, GERD.   PAST SURGICAL HISTORY: Right hip surgery,  esophageal dilatation, umbilical hernia repair, tubal ligation.   SOCIAL HISTORY: Lives alone. No smoking or alcohol consumption.   FAMILY HISTORY: Pneumonia, aneurysm, breast cancer.   ALLERGIES: CODEINE, LEVAQUIN.   HOME MEDICATIONS: Tylenol p.r.n., omeprazole 20 a day, Cardizem 30 mg every 12 hours as needed, Lipitor 20 mg a day, aspirin 81 mg a day.  REVIEW OF SYSTEMS: Denies blackout spells, syncope, no significant nausea or vomiting, no fever, no chills and no sweats. No weight loss. NO weight gain. No hemoptysis or hematemesis. No bright red blood per rectum. She is status post hip surgery.  PHYSICAL EXAMINATION: VITAL SIGNS: Blood pressure 120/70, pulse was 133 but now it is down to 80 and regular, respiratory rate of 14.  HEENT: Normocephalic, atraumatic. Pupils equal and  reactive to light. NECK:  Supple. No significant JVD, bruits or adenopathy. LUNGS:  Clear to auscultation and percussion. No significant wheezing, rhonchi or rale.  HEART: Irregularly irregular.  ABDOMEN:  Obese, but palpably benign.  EXTREMITY:  She has right hip surgery and scar. NEUROLOGIC:  Within normal limits.    CULTURE AND LABORATORIES: The hip x-ray with recent hip surgery. EKG: Rapid atrial fibrillation.   ASSESSMENT: Atrial fibrillation, rapid ventricular response, paroxysmal. Hypertension. Hyperlipidemia. Mild dehydration and anemia. Recent hip surgery.  PLAN: 1.  Atrial fibrillation: Will control rate with Cardizem. Increase the dose if necessary. Hold off on anticoagulation except for DVT purposes. 2.  Continue hypertension management. We will give Cardizem, as well, that we are using for rate control. 3.  consider physical therapy  4. statin  treatment for hyperlipidemia.  We will continue to follow the patient conservatively. Do not recommend cardioversion. Do not recommend antiarrhythmics, just rate control for now should be adequate until the patient recovers. The patient  denies chest pain currently or shortness of breathand vital signs are stable, so will continue to treat the patient conservatively .    ____________________________ Loran Senters Clayborn Bigness, MD ddc:ST D: 04/29/2014 08:17:00 ET T: 04/29/2014 09:57:58 ET JOB#: [id number]  cc: Lakesha Levinson D. Clayborn Bigness, MD, <Dictator> Yolonda Kida MD ELECTRONICALLY SIGNED 05/08/2014 16:51

## 2014-09-28 LAB — SURGICAL PATHOLOGY

## 2014-09-29 ENCOUNTER — Ambulatory Visit: Payer: Self-pay

## 2014-09-29 ENCOUNTER — Ambulatory Visit
Admit: 2014-09-29 | Disposition: A | Discharge status: Home / Self Care | Payer: Self-pay | Attending: General Practice | Admitting: General Practice

## 2014-09-29 LAB — BASIC METABOLIC PANEL
Anion Gap: 6 — ABNORMAL LOW (ref 7–16)
BUN: 20 mg/dL
CREATININE: 0.67 mg/dL
Calcium, Total: 9.5 mg/dL
Chloride: 107 mmol/L
Co2: 26 mmol/L
EGFR (Non-African Amer.): >60
Glucose: 98 mg/dL
Potassium: 3.9 mmol/L
Sodium: 139 mmol/L

## 2014-09-29 LAB — SEDIMENTATION RATE: Erythrocyte Sed Rate: 17 mm/hr (ref 0–30)

## 2014-09-29 LAB — URINALYSIS, COMPLETE
BILIRUBIN, UR: NEGATIVE
BLOOD: NEGATIVE
Glucose,UR: NEGATIVE mg/dL (ref 0–75)
KETONE: NEGATIVE
Leukocyte Esterase: NEGATIVE
Nitrite: NEGATIVE
Ph: 5 (ref 4.5–8.0)
Protein: NEGATIVE
SPECIFIC GRAVITY: 1.021 (ref 1.003–1.030)

## 2014-09-29 LAB — CBC
HCT: 30.9 % — ABNORMAL LOW (ref 35.0–47.0)
HGB: 10.4 g/dL — AB (ref 12.0–16.0)
MCH: 36.2 pg — ABNORMAL HIGH (ref 26.0–34.0)
MCHC: 33.6 g/dL (ref 32.0–36.0)
MCV: 108 fL — ABNORMAL HIGH (ref 80–100)
PLATELETS: 130 10*3/uL — AB (ref 150–440)
RBC: 2.86 10*6/uL — ABNORMAL LOW (ref 3.80–5.20)
RDW: 14.8 % — AB (ref 11.5–14.5)
WBC: 7 10*3/uL (ref 3.6–11.0)

## 2014-09-29 LAB — MRSA PCR SCREENING

## 2014-09-29 LAB — PROTIME-INR
INR: 1
Prothrombin Time: 13.6 secs

## 2014-09-29 LAB — APTT: Activated PTT: 29.4 secs (ref 23.6–35.9)

## 2014-10-01 LAB — URINE CULTURE

## 2014-10-10 ENCOUNTER — Encounter: Payer: Self-pay | Admitting: Anesthesiology

## 2014-10-10 NOTE — Anesthesia Preprocedure Evaluation (Addendum)
Anesthesia Evaluation   Patient awake    Reviewed: Allergy & Precautions, H&P , NPO status , Patient's Chart, lab work & pertinent test results, reviewed documented beta blocker date and time   Airway Mallampati: II  TM Distance: >3 FB Neck ROM: Full    Dental   Pulmonary          Cardiovascular + dysrhythmias Atrial Fibrillation + Valvular Problems/Murmurs     Neuro/Psych    GI/Hepatic hiatal hernia,   Endo/Other    Renal/GU      Musculoskeletal  (+) Arthritis -,   Abdominal   Peds  Hematology   Anesthesia Other Findings   Reproductive/Obstetrics                            Anesthesia Physical Anesthesia Plan  ASA: III  Anesthesia Plan: Spinal   Post-op Pain Management:    Induction:   Airway Management Planned: Nasal Cannula  Additional Equipment:   Intra-op Plan:   Post-operative Plan:   Informed Consent: I have reviewed the patients History and Physical, chart, labs and discussed the procedure including the risks, benefits and alternatives for the proposed anesthesia with the patient or authorized representative who has indicated his/her understanding and acceptance.     Plan Discussed with: CRNA and Surgeon  Anesthesia Plan Comments:        Anesthesia Quick Evaluation

## 2014-10-12 ENCOUNTER — Inpatient Hospital Stay: Payer: Medicare Other

## 2014-10-12 ENCOUNTER — Inpatient Hospital Stay: Payer: Medicare Other | Admitting: Anesthesiology

## 2014-10-12 ENCOUNTER — Encounter
Admission: RE | Disposition: A | Discharge status: Skilled Nursing Facility | Payer: Self-pay | Source: Ambulatory Visit | Attending: Orthopedic Surgery

## 2014-10-12 ENCOUNTER — Inpatient Hospital Stay
Admission: RE | Admit: 2014-10-12 | Discharge: 2014-10-15 | DRG: 470 | Disposition: A | Discharge status: Skilled Nursing Facility | Payer: Medicare Other | Source: Ambulatory Visit | Attending: Orthopedic Surgery | Admitting: Orthopedic Surgery

## 2014-10-12 ENCOUNTER — Encounter: Payer: Self-pay | Admitting: Orthopedic Surgery

## 2014-10-12 ENCOUNTER — Other Ambulatory Visit: Payer: Self-pay | Admitting: Orthopedic Surgery

## 2014-10-12 DIAGNOSIS — Z79891 Long term (current) use of opiate analgesic: Secondary | ICD-10-CM

## 2014-10-12 DIAGNOSIS — Z96642 Presence of left artificial hip joint: Secondary | ICD-10-CM

## 2014-10-12 DIAGNOSIS — I4891 Unspecified atrial fibrillation: Secondary | ICD-10-CM | POA: Diagnosis present

## 2014-10-12 DIAGNOSIS — R251 Tremor, unspecified: Secondary | ICD-10-CM | POA: Diagnosis present

## 2014-10-12 DIAGNOSIS — D649 Anemia, unspecified: Secondary | ICD-10-CM | POA: Diagnosis present

## 2014-10-12 DIAGNOSIS — E876 Hypokalemia: Secondary | ICD-10-CM | POA: Diagnosis not present

## 2014-10-12 DIAGNOSIS — M1612 Unilateral primary osteoarthritis, left hip: Principal | ICD-10-CM | POA: Diagnosis present

## 2014-10-12 DIAGNOSIS — Z96649 Presence of unspecified artificial hip joint: Secondary | ICD-10-CM

## 2014-10-12 DIAGNOSIS — R011 Cardiac murmur, unspecified: Secondary | ICD-10-CM | POA: Diagnosis present

## 2014-10-12 HISTORY — PX: TOTAL HIP ARTHROPLASTY: SHX124

## 2014-10-12 HISTORY — PX: OTHER SURGICAL HISTORY: SHX169

## 2014-10-12 SURGERY — ARTHROPLASTY, HIP, TOTAL,POSTERIOR APPROACH
Anesthesia: Spinal | Laterality: Left

## 2014-10-12 MED ORDER — SENNOSIDES-DOCUSATE SODIUM 8.6-50 MG PO TABS
1.0000 | ORAL_TABLET | Freq: Two times a day (BID) | ORAL | Status: DC
  Administered 2014-10-12 – 2014-10-15 (×6): 1 via ORAL
  Filled 2014-10-12 (×7): qty 1

## 2014-10-12 MED ORDER — MIDAZOLAM HCL 2 MG/2ML IJ SOLN
INTRAMUSCULAR | Status: DC | PRN
  Administered 2014-10-12: 1 mg via INTRAVENOUS

## 2014-10-12 MED ORDER — MORPHINE SULFATE 2 MG/ML IJ SOLN
2.0000 mg | INTRAMUSCULAR | Status: DC | PRN
  Administered 2014-10-12: 2 mg via INTRAVENOUS
  Filled 2014-10-12: qty 1

## 2014-10-12 MED ORDER — FENTANYL CITRATE (PF) 100 MCG/2ML IJ SOLN
INTRAMUSCULAR | Status: DC | PRN
  Administered 2014-10-12: 50 ug via INTRAVENOUS

## 2014-10-12 MED ORDER — SODIUM CHLORIDE 0.9 % IV SOLN
INTRAVENOUS | Status: DC
  Administered 2014-10-12 – 2014-10-13 (×3): via INTRAVENOUS

## 2014-10-12 MED ORDER — EPHEDRINE SULFATE 50 MG/ML IJ SOLN
INTRAMUSCULAR | Status: DC | PRN
  Administered 2014-10-12: 10 mg via INTRAVENOUS

## 2014-10-12 MED ORDER — ONDANSETRON HCL 4 MG PO TABS
4.0000 mg | ORAL_TABLET | Freq: Four times a day (QID) | ORAL | Status: DC | PRN
  Administered 2014-10-13 – 2014-10-15 (×3): 4 mg via ORAL
  Filled 2014-10-12 (×2): qty 1

## 2014-10-12 MED ORDER — ONDANSETRON HCL 4 MG/2ML IJ SOLN: 4.0000 mg | Freq: Once | INTRAMUSCULAR | Status: DC | PRN

## 2014-10-12 MED ORDER — CEFAZOLIN SODIUM-DEXTROSE 2-3 GM-% IV SOLR
2.0000 g | INTRAVENOUS | Status: AC
  Administered 2014-10-12: 2 g via INTRAVENOUS

## 2014-10-12 MED ORDER — CEFAZOLIN SODIUM-DEXTROSE 2-3 GM-% IV SOLR
2.0000 g | Freq: Four times a day (QID) | INTRAVENOUS | Status: AC
  Administered 2014-10-12 – 2014-10-13 (×4): 2 g via INTRAVENOUS
  Filled 2014-10-12 (×4): qty 50

## 2014-10-12 MED ORDER — SODIUM CHLORIDE 0.9 % IV SOLN
1000.0000 mg | INTRAVENOUS | Status: DC
Start: 2014-10-12 — End: 2014-10-12
  Filled 2014-10-12: qty 10

## 2014-10-12 MED ORDER — PHENYLEPHRINE HCL 10 MG/ML IJ SOLN: 10.0000 mg | INTRAVENOUS | Status: DC | PRN

## 2014-10-12 MED ORDER — FLEET ENEMA 7-19 GM/118ML RE ENEM: 1.0000 | ENEMA | Freq: Once | RECTAL | Status: AC | PRN

## 2014-10-12 MED ORDER — ADVANCED EYE HEALTH 250-2.5-0.5 MG PO CAPS
1.0000 | ORAL_CAPSULE | Freq: Two times a day (BID) | ORAL | Status: DC
  Administered 2014-10-12 – 2014-10-15 (×6): 1 via ORAL

## 2014-10-12 MED ORDER — FENTANYL CITRATE (PF) 100 MCG/2ML IJ SOLN: 25.0000 ug | INTRAMUSCULAR | Status: DC | PRN

## 2014-10-12 MED ORDER — PROPOFOL INFUSION 10 MG/ML OPTIME
INTRAVENOUS | Status: DC | PRN
  Administered 2014-10-12: 15 ug/kg/min via INTRAVENOUS

## 2014-10-12 MED ORDER — DEXTROSE 5 % IV SOLN
100.0000 mg | INTRAVENOUS | Status: DC | PRN
  Administered 2014-10-12: 10 mg via INTRAVENOUS
  Administered 2014-10-12 (×2): 5 mg via INTRAVENOUS

## 2014-10-12 MED ORDER — ACETAMINOPHEN 10 MG/ML IV SOLN
1000.0000 mg | Freq: Four times a day (QID) | INTRAVENOUS | Status: AC
  Administered 2014-10-12 – 2014-10-13 (×4): 1000 mg via INTRAVENOUS
  Filled 2014-10-12 (×4): qty 100

## 2014-10-12 MED ORDER — FERROUS SULFATE 325 (65 FE) MG PO TABS
325.0000 mg | ORAL_TABLET | Freq: Two times a day (BID) | ORAL | Status: DC
  Administered 2014-10-13 – 2014-10-15 (×6): 325 mg via ORAL
  Filled 2014-10-12 (×7): qty 1

## 2014-10-12 MED ORDER — TRAMADOL HCL 50 MG PO TABS: 50.0000 mg | ORAL_TABLET | ORAL | Status: DC | PRN

## 2014-10-12 MED ORDER — DIPHENHYDRAMINE HCL 12.5 MG/5ML PO ELIX
12.5000 mg | ORAL_SOLUTION | ORAL | Status: DC | PRN
  Filled 2014-10-12: qty 10

## 2014-10-12 MED ORDER — PHENYLEPHRINE HCL 10 MG/ML IJ SOLN
INTRAMUSCULAR | Status: DC | PRN
  Administered 2014-10-12: 100 ug via INTRAVENOUS
  Administered 2014-10-12: 50 ug via INTRAVENOUS
  Administered 2014-10-12 (×2): 100 ug via INTRAVENOUS

## 2014-10-12 MED ORDER — ONDANSETRON HCL 4 MG/2ML IJ SOLN
4.0000 mg | Freq: Four times a day (QID) | INTRAMUSCULAR | Status: DC | PRN
  Administered 2014-10-12: 4 mg via INTRAVENOUS
  Filled 2014-10-12 (×2): qty 2

## 2014-10-12 MED ORDER — ENOXAPARIN SODIUM 30 MG/0.3ML ~~LOC~~ SOLN
30.0000 mg | Freq: Two times a day (BID) | SUBCUTANEOUS | Status: DC
  Administered 2014-10-13 – 2014-10-15 (×5): 30 mg via SUBCUTANEOUS
  Filled 2014-10-12 (×7): qty 0.3

## 2014-10-12 MED ORDER — ACETAMINOPHEN 325 MG PO TABS
650.0000 mg | ORAL_TABLET | Freq: Four times a day (QID) | ORAL | Status: DC | PRN
  Administered 2014-10-14: 650 mg via ORAL
  Filled 2014-10-12: qty 2

## 2014-10-12 MED ORDER — PANTOPRAZOLE SODIUM 40 MG PO TBEC
40.0000 mg | DELAYED_RELEASE_TABLET | Freq: Two times a day (BID) | ORAL | Status: DC
Start: 2014-10-12 — End: 2014-10-15
  Administered 2014-10-12 – 2014-10-15 (×6): 40 mg via ORAL
  Filled 2014-10-12 (×7): qty 1

## 2014-10-12 MED ORDER — BUPIVACAINE HCL (PF) 0.25 % IJ SOLN
INTRAMUSCULAR | Status: DC | PRN
  Administered 2014-10-12: 12.5 mg

## 2014-10-12 MED ORDER — MENTHOL 3 MG MT LOZG
1.0000 | LOZENGE | OROMUCOSAL | Status: DC | PRN
  Filled 2014-10-12: qty 9

## 2014-10-12 MED ORDER — ESMOLOL HCL 10 MG/ML IV SOLN
INTRAVENOUS | Status: DC | PRN
  Administered 2014-10-12: 20 mg via INTRAVENOUS

## 2014-10-12 MED ORDER — DEXTROSE 5 % IV SOLN
10.0000 mg | INTRAVENOUS | Status: DC | PRN
  Administered 2014-10-12: 10 ug/min via INTRAVENOUS

## 2014-10-12 MED ORDER — ACETAMINOPHEN 500 MG PO TABS: 500.0000 mg | ORAL_TABLET | Freq: Four times a day (QID) | ORAL | Status: DC | PRN

## 2014-10-12 MED ORDER — LACTATED RINGERS IV SOLN
INTRAVENOUS | Status: DC
  Administered 2014-10-12 (×2): via INTRAVENOUS

## 2014-10-12 MED ORDER — BUSPIRONE HCL 5 MG PO TABS
7.5000 mg | ORAL_TABLET | Freq: Two times a day (BID) | ORAL | Status: DC | PRN
  Filled 2014-10-12: qty 1

## 2014-10-12 MED ORDER — ACETAMINOPHEN 10 MG/ML IV SOLN
INTRAVENOUS | Status: AC
  Administered 2014-10-12 – 2014-10-13 (×2): 1000 mg via INTRAVENOUS
  Filled 2014-10-12: qty 100

## 2014-10-12 MED ORDER — MAGNESIUM HYDROXIDE 400 MG/5ML PO SUSP
30.0000 mL | Freq: Every day | ORAL | Status: DC | PRN
  Administered 2014-10-13 – 2014-10-14 (×2): 30 mL via ORAL
  Filled 2014-10-12 (×2): qty 30

## 2014-10-12 MED ORDER — DILTIAZEM HCL 30 MG PO TABS
30.0000 mg | ORAL_TABLET | Freq: Three times a day (TID) | ORAL | Status: DC
  Filled 2014-10-12: qty 1

## 2014-10-12 MED ORDER — GABAPENTIN 100 MG PO CAPS
100.0000 mg | ORAL_CAPSULE | ORAL | Status: DC
  Administered 2014-10-13 – 2014-10-15 (×3): 100 mg via ORAL
  Filled 2014-10-12 (×3): qty 1

## 2014-10-12 MED ORDER — ADULT MULTIVITAMIN W/MINERALS CH
1.0000 | ORAL_TABLET | Freq: Every day | ORAL | Status: DC
  Administered 2014-10-13 – 2014-10-15 (×3): 1 via ORAL
  Filled 2014-10-12 (×4): qty 1

## 2014-10-12 MED ORDER — ACETAMINOPHEN 650 MG RE SUPP: 650.0000 mg | Freq: Four times a day (QID) | RECTAL | Status: DC | PRN

## 2014-10-12 MED ORDER — CEFAZOLIN SODIUM-DEXTROSE 2-3 GM-% IV SOLR
INTRAVENOUS | Status: AC
  Filled 2014-10-12: qty 50

## 2014-10-12 MED ORDER — ALUM & MAG HYDROXIDE-SIMETH 200-200-20 MG/5ML PO SUSP: 30.0000 mL | ORAL | Status: DC | PRN

## 2014-10-12 MED ORDER — METOCLOPRAMIDE HCL 10 MG PO TABS: 10.0000 mg | ORAL_TABLET | Freq: Three times a day (TID) | ORAL | Status: DC

## 2014-10-12 MED ORDER — PROPOFOL 10 MG/ML IV BOLUS
INTRAVENOUS | Status: DC | PRN
  Administered 2014-10-12: 40 mg via INTRAVENOUS

## 2014-10-12 MED ORDER — OXYCODONE HCL 5 MG PO TABS
5.0000 mg | ORAL_TABLET | ORAL | Status: DC | PRN
  Administered 2014-10-12 – 2014-10-15 (×11): 5 mg via ORAL
  Filled 2014-10-12 (×11): qty 1

## 2014-10-12 MED ORDER — BISACODYL 10 MG RE SUPP
10.0000 mg | Freq: Every day | RECTAL | Status: DC | PRN
  Administered 2014-10-15: 10 mg via RECTAL
  Filled 2014-10-12 (×2): qty 1

## 2014-10-12 MED ORDER — MECLIZINE HCL 12.5 MG PO TABS
12.5000 mg | ORAL_TABLET | Freq: Three times a day (TID) | ORAL | Status: DC | PRN
  Filled 2014-10-12: qty 1

## 2014-10-12 MED ORDER — PHENOL 1.4 % MT LIQD
1.0000 | OROMUCOSAL | Status: DC | PRN
  Filled 2014-10-12: qty 177

## 2014-10-12 MED ORDER — SERTRALINE HCL 50 MG PO TABS
50.0000 mg | ORAL_TABLET | Freq: Every day | ORAL | Status: DC
  Administered 2014-10-13 – 2014-10-15 (×3): 50 mg via ORAL
  Filled 2014-10-12 (×4): qty 1

## 2014-10-12 MED ORDER — CHLORHEXIDINE GLUCONATE 4 % EX LIQD: 60.0000 mL | Freq: Once | CUTANEOUS | Status: DC

## 2014-10-12 MED ORDER — FENTANYL CITRATE (PF) 100 MCG/2ML IJ SOLN: INTRAMUSCULAR | Status: DC | PRN

## 2014-10-12 MED ORDER — ATORVASTATIN CALCIUM 20 MG PO TABS
20.0000 mg | ORAL_TABLET | Freq: Every day | ORAL | Status: DC
  Administered 2014-10-13 – 2014-10-15 (×3): 20 mg via ORAL
  Filled 2014-10-12 (×4): qty 1

## 2014-10-12 SURGICAL SUPPLY — 50 items
BLADE SAW 1 (BLADE) ×3 IMPLANT
BNDG COHESIVE 4X5 TAN STRL (GAUZE/BANDAGES/DRESSINGS) ×3 IMPLANT
CANISTER SUCT 1200ML W/VALVE (MISCELLANEOUS) ×3 IMPLANT
CANISTER SUCT 3000ML (MISCELLANEOUS) ×6 IMPLANT
CAPT HIP TOTAL 2 ×3 IMPLANT
CARTRIDGE OIL MAESTRO DRILL (MISCELLANEOUS) ×1 IMPLANT
CATH FOL LEG HOLDER (MISCELLANEOUS) ×3 IMPLANT
CATH TRAY 16F METER LATEX (MISCELLANEOUS) ×3 IMPLANT
DIFFUSER MAESTRO (MISCELLANEOUS) ×3 IMPLANT
DRAPE INCISE IOBAN 66X60 STRL (DRAPES) ×3 IMPLANT
DRAPE SHEET LG 3/4 BI-LAMINATE (DRAPES) ×3 IMPLANT
DRAPE TABLE BACK 80X90 (DRAPES) ×3 IMPLANT
DRSG DERMACEA 8X12 NADH (GAUZE/BANDAGES/DRESSINGS) ×3 IMPLANT
DRSG OPSITE POSTOP 4X12 (GAUZE/BANDAGES/DRESSINGS) ×3 IMPLANT
DRSG OPSITE POSTOP 4X14 (GAUZE/BANDAGES/DRESSINGS) ×3 IMPLANT
DURAPREP 26ML APPLICATOR (WOUND CARE) ×3 IMPLANT
ELECT BLADE 6.5 EXT (BLADE) ×3 IMPLANT
ELECT CAUTERY BLADE 6.4 (BLADE) ×3 IMPLANT
GLOVE BIOGEL M STRL SZ7.5 (GLOVE) ×3 IMPLANT
GLOVE INDICATOR 8.0 STRL GRN (GLOVE) ×3 IMPLANT
GLOVE SURG 9.0 ORTHO LTXF (GLOVE) ×3 IMPLANT
GLOVE SURG ORTHO 9.0 STRL STRW (GLOVE) ×3 IMPLANT
GOWN STRL REUS W/ TWL LRG LVL3 (GOWN DISPOSABLE) ×1 IMPLANT
GOWN STRL REUS W/TWL 2XL LVL3 (GOWN DISPOSABLE) ×3 IMPLANT
GOWN STRL REUS W/TWL LRG LVL3 (GOWN DISPOSABLE) ×2
GOWN STRL REUS W/TWL XL LVL4 (GOWN DISPOSABLE) ×3 IMPLANT
HANDPIECE SUCTION TUBG SURGILV (MISCELLANEOUS) ×3 IMPLANT
HEMOVAC 400CC 10FR (MISCELLANEOUS) ×3 IMPLANT
HOOD PEEL AWAY FACE SHEILD DIS (HOOD) ×6 IMPLANT
KIT RM TURNOVER STRD PROC AR (KITS) ×3 IMPLANT
NDL SAFETY 18GX1.5 (NEEDLE) ×3 IMPLANT
NS IRRIG 1000ML POUR BTL (IV SOLUTION) ×3 IMPLANT
OIL CARTRIDGE MAESTRO DRILL (MISCELLANEOUS) ×3
PACK HIP PROSTHESIS (MISCELLANEOUS) ×3 IMPLANT
PIN STEINMANN THREADED TIP (PIN) ×3 IMPLANT
SOL .9 NS 3000ML IRR  AL (IV SOLUTION) ×2
SOL .9 NS 3000ML IRR UROMATIC (IV SOLUTION) ×1 IMPLANT
SOL PREP PVP 2OZ (MISCELLANEOUS) ×3
SOLUTION PREP PVP 2OZ (MISCELLANEOUS) ×1 IMPLANT
SPONGE DRAIN TRACH 4X4 STRL 2S (GAUZE/BANDAGES/DRESSINGS) ×3 IMPLANT
STAPLER SKIN PROX 35W (STAPLE) ×3 IMPLANT
SUT ETHIBOND #5 BRAIDED 30INL (SUTURE) ×3 IMPLANT
SUT VIC AB 0 CT1 36 (SUTURE) ×3 IMPLANT
SUT VIC AB 1 CT1 36 (SUTURE) ×6 IMPLANT
SUT VIC AB 2-0 CT1 27 (SUTURE) ×2
SUT VIC AB 2-0 CT1 TAPERPNT 27 (SUTURE) ×1 IMPLANT
SYR 20CC LL (SYRINGE) ×3 IMPLANT
TAPE ADH 3 LX (MISCELLANEOUS) ×3 IMPLANT
TAPE TRANSPORE STRL 2 31045 (GAUZE/BANDAGES/DRESSINGS) ×3 IMPLANT
WATER STERILE IRR 1000ML POUR (IV SOLUTION) ×6 IMPLANT

## 2014-10-12 NOTE — Anesthesia Procedure Notes (Signed)
Spinal Patient location during procedure: OR Staffing Anesthesiologist: Gunnar Bulla Performed by: anesthesiologist  Preanesthetic Checklist Completed: patient identified, site marked, surgical consent, pre-op evaluation, timeout performed, IV checked and risks and benefits discussed Spinal Block Patient position: sitting Prep: Betadine Patient monitoring: heart rate, cardiac monitor, continuous pulse ox and blood pressure Approach: midline Location: L3-4 Injection technique: single-shot Needle Needle type: Pencil-Tip  Needle gauge: 25 G Needle length: 9 cm Assessment Sensory level: T10

## 2014-10-12 NOTE — H&P (Signed)
The patient has been re-examined, and the chart reviewed, and there have been no interval changes to the documented history and physical.    The risks, benefits, and alternatives have been discussed at length, and the patient is willing to proceed.   

## 2014-10-12 NOTE — Plan of Care (Signed)
Problem: Consults Goal: Diagnosis- Total Joint Replacement Outcome: Completed/Met Date Met:  10/12/14 Primary Total Hip

## 2014-10-12 NOTE — Brief Op Note (Signed)
10/12/2014  10:30 AM  PATIENT:  Vickie Howard  79 y.o. female  PRE-OPERATIVE DIAGNOSIS:  Degenerative arthrosis of the left hip  POST-OPERATIVE DIAGNOSIS:  Degenerative arthrosis of the left hip  PROCEDURE:  Procedure(s): TOTAL HIP ARTHROPLASTY (Left)  SURGEON:  Surgeon(s) and Role:    * Dereck Leep, MD - Primary  ASSISTANTS: Vance Peper, PA   ANESTHESIA:   spinal  EBL:  Total I/O In: -  Out: 575 [Urine:500; Blood:75]  BLOOD ADMINISTERED:none  DRAINS: Hemovac x2   LOCAL MEDICATIONS USED:  NONE  SPECIMEN:  Source of Specimen:  left femoral head  DISPOSITION OF SPECIMEN:  PATHOLOGY  COUNTS:  YES  TOURNIQUET:  * No tourniquets in log *  DICTATION: .Dragon Dictation  PLAN OF CARE: Admit to inpatient   PATIENT DISPOSITION:  PACU - hemodynamically stable.   Delay start of Pharmacological VTE agent (>24hrs) due to surgical blood loss or risk of bleeding: yes

## 2014-10-12 NOTE — Transfer of Care (Signed)
Immediate Anesthesia Transfer of Care Note  Patient: Vickie Howard  Procedure(s) Performed: Procedure(s): TOTAL HIP ARTHROPLASTY (Left)  Patient Location: PACU  Anesthesia Type:Spinal  Level of Consciousness: awake, alert  and oriented  Airway & Oxygen Therapy: Patient Spontanous Breathing  Post-op Assessment: Report given to RN and Post -op Vital signs reviewed and stable  Post vital signs: Reviewed and stable  Last Vitals:  Filed Vitals:   10/12/14 0600  BP: 156/72  Pulse: 77  Temp: 36.4 C  Resp: 16    Complications: No apparent anesthesia complications

## 2014-10-12 NOTE — Op Note (Signed)
DATE OF SURGERY:  10/12/2014  PATIENT NAME:  Vickie Howard   DOB: 03-20-1936  MRN: [id number]  PRE-OPERATIVE DIAGNOSIS: Degenerative arthrosis of the left hip, primary  POST-OPERATIVE DIAGNOSIS:  Same  PROCEDURE:  Left total hip arthroplasty  SURGEON:  Marciano Sequin. M.D.  ASSISTANT:  Vance Peper, PA (present and scrubbed throughout the case, critical for assistance with exposure, retraction, instrumentation, and closure)  ANESTHESIA: Spinal  ESTIMATED BLOOD LOSS: 75 mL  FLUIDS REPLACED: 1100 mL of crystalloid  DRAINS: 2 medium drains to a Hemovac reservoir  IMPLANTS UTILIZED: DePuy 15 mm small stature AML femoral stem, 50 mm OD Pinnacle 100 acetabular component, neutral Pinnacle ALTRX polyethylene insert, and a 32 mm CoCr hip ball with a +1 mm neck length  INDICATIONS FOR SURGERY: Vickie Howard is a 79 y.o. year old female with a long history of progressive hip and groin  pain. X-rays demonstrated severe degenerative changes. He had not seen any significant improvement despite conservative nonsurgical intervention. After discussion of the risks and benefits of surgical intervention, the patient expressed understanding of the risks benefits and agree with plans for total hip arthroplasty.   The risks, benefits, and alternatives were discussed at length including but not limited to the risks of infection, bleeding, nerve injury, stiffness, blood clots, the need for revision surgery, limb length inequality, dislocation, cardiopulmonary complications, among others, and they were willing to proceed.  PROCEDURE IN DETAIL: The patient was brought into the operating room and, after adequate spinal anesthesia was achieved, the patient was placed in a right lateral decubitus position. Axillary roll was placed and all bony prominences were well-padded. The patient's left hip was cleaned and prepped with alcohol and DuraPrep and draped in the usual sterile fashion. A "timeout" was  performed as per usual protocol. A lateral curvilinear incision was made gently curving towards the posterior superior iliac spine. The IT band was incised in line with the skin incision and the fibers of the gluteus maximus were split in line. The piriformis tendon was identified, skeletonized, and incised at its insertion to the proximal femur and reflected posteriorly. A T type posterior capsulotomy was performed. Prior to dislocation of the femoral head, a threaded Steinmann pin was inserted through a separate stab incision into the pelvis superior to the acetabulum and bent in the form of a stylus so as to assess limb length and hip offset throughout the procedure. The femoral head was then dislocated posteriorly. Inspection of the femoral head demonstrated severe degenerative changes with full-thickness loss of articular cartilage. The femoral neck cut was performed using an oscillating saw. The anterior capsule was elevated off of the femoral neck using a periosteal elevator. Attention was then directed to the acetabulum. The remnant of the labrum was excised using electrocautery. Inspection of the acetabulum also demonstrated significant degenerative changes. The acetabulum was reamed in sequential fashion up to a 49 mm diameter. Good punctate bleeding bone was encountered. A 50 mm Pinnacle 100 acetabular component was positioned and impacted into place. Good scratch fit was appreciated. A neutral polyethylene trial was inserted.  Attention was then directed to the proximal femur. A hole for reaming of the proximal femoral canal was created using a high-speed bur. The femoral canal was reamed in sequential fashion up to a 14.5 mm diameter. This allowed for approximately 6 mm of scratch fit. Serial broaches were inserted up to a 15 mm small stature femoral broach. Calcar region was planed and a trial reduction was performed  using a 32 mm hip ball with a +1 mm neck length. Good equalization of limb lengths  and hip offset was appreciated and excellent stability was noted both anteriorly and posteriorly. Trial components were removed. The acetabular shell was irrigated with copious amounts of normal saline with antibiotic solution and suctioned dry. A neutral Pinnacle ALTRX polyethylene insert was positioned and impacted into place. It was elected to use a 15 mm reamer to allow for a line to line fit with the femoral stem. Next, a 15 mm small stature AML femoral stem was positioned and impacted into place. Excellent scratch fit was appreciated. A trial reduction was again performed with a 32 mm hip ball with a +1 mm neck length. Again, good equalization of limb lengths was appreciated and excellent stability appreciated both anteriorly and posteriorly. The hip was then dislocated and the trial hip ball was removed. The Morse taper was cleaned and dried. A 32 mm CoCr hip ball with a +1 mm neck length was placed on the trunnion and impacted into place. The hip was then reduced and placed through a range of motion. Excellent stability was appreciated both anteriorly and posteriorly.  The wound was irrigated with copious amounts of normal saline with antibiotic solution and suctioned dry. Good hemostasis was appreciated. The posterior capsulotomy was repaired using #5 Ethibond. Piriformis tendon was reapproximated to the undersurface of the gluteus medius tendon using #5 Ethibond. Two medium drains were placed in the wound bed and brought out through separate stab incisions to be attached to a Hemovac reservoir. The IT band was reapproximated using interrupted sutures of #1 Vicryl. Subcutaneous tissue was proximal phalanx using first #0 Vicryl followed by #2-0 Vicryl. The skin was closed with skin staples.  The patient tolerated the procedure well and was transported to the recovery room in stable condition.   Marciano Sequin., M.D.

## 2014-10-12 NOTE — Anesthesia Postprocedure Evaluation (Signed)
  Anesthesia Post-op Note  Patient: Vickie Howard  Procedure(s) Performed: Procedure(s): TOTAL HIP ARTHROPLASTY (Left)  Anesthesia type:Spinal  Patient location: PACU  Post pain: Pain level controlled  Post assessment: Post-op Vital signs reviewed, Patient's Cardiovascular Status Stable, Respiratory Function Stable, Patent Airway and No signs of Nausea or vomiting  Post vital signs: Reviewed and stable  Last Vitals:  Filed Vitals:   10/12/14 1212  BP: 114/47  Pulse: 90  Temp: 36.6 C  Resp: 18    Level of consciousness: awake, alert  and patient cooperative  Complications: No apparent anesthesia complications

## 2014-10-13 ENCOUNTER — Encounter: Payer: Self-pay | Admitting: Physician Assistant

## 2014-10-13 ENCOUNTER — Encounter: Payer: Self-pay | Admitting: Orthopedic Surgery

## 2014-10-13 LAB — BASIC METABOLIC PANEL
Anion gap: 4 — ABNORMAL LOW (ref 5–15)
BUN: 16 mg/dL (ref 6–20)
CO2: 29 mmol/L (ref 22–32)
Calcium: 8.5 mg/dL — ABNORMAL LOW (ref 8.9–10.3)
Chloride: 108 mmol/L (ref 101–111)
Creatinine, Ser: 0.69 mg/dL (ref 0.44–1.00)
GFR calc Af Amer: >60 mL/min (ref >60)
GFR calc non Af Amer: >60 mL/min (ref >60)
Glucose, Bld: 100 mg/dL — ABNORMAL HIGH (ref 65–99)
Potassium: 4 mmol/L (ref 3.5–5.1)
Sodium: 141 mmol/L (ref 135–145)

## 2014-10-13 LAB — CBC
HEMATOCRIT: 25 % — AB (ref 35.0–47.0)
HEMOGLOBIN: 8.2 g/dL — AB (ref 12.0–16.0)
MCH: 36 pg — ABNORMAL HIGH (ref 26.0–34.0)
MCHC: 32.9 g/dL (ref 32.0–36.0)
MCV: 109.4 fL — ABNORMAL HIGH (ref 80.0–100.0)
Platelets: 121 10*3/uL — ABNORMAL LOW (ref 150–440)
RBC: 2.28 MIL/uL — AB (ref 3.80–5.20)
RDW: 15.3 % — ABNORMAL HIGH (ref 11.5–14.5)
WBC: 8.9 10*3/uL (ref 3.6–11.0)

## 2014-10-13 MED ORDER — ENSURE ENLIVE PO LIQD
237.0000 mL | Freq: Two times a day (BID) | ORAL | Status: DC
  Administered 2014-10-14 – 2014-10-15 (×3): 237 mL via ORAL

## 2014-10-13 MED ORDER — OXYCODONE HCL 5 MG PO TABS: 5.0000 mg | ORAL_TABLET | ORAL | Status: DC | PRN

## 2014-10-13 MED ORDER — DILTIAZEM HCL ER COATED BEADS 120 MG PO CP24
120.0000 mg | ORAL_CAPSULE | Freq: Every day | ORAL | Status: DC
  Administered 2014-10-13 – 2014-10-14 (×2): 120 mg via ORAL
  Filled 2014-10-13 (×4): qty 1

## 2014-10-13 MED ORDER — ENOXAPARIN SODIUM 30 MG/0.3ML ~~LOC~~ SOLN: 30.0000 mg | Freq: Two times a day (BID) | SUBCUTANEOUS | Status: DC

## 2014-10-13 MED ORDER — ENSURE ENLIVE PO LIQD: 237.0000 mL | Freq: Two times a day (BID) | ORAL | Status: DC

## 2014-10-13 MED ORDER — ACETAMINOPHEN 500 MG PO TABS: 500.0000 mg | ORAL_TABLET | Freq: Four times a day (QID) | ORAL | Status: DC | PRN

## 2014-10-13 NOTE — Clinical Social Work Note (Signed)
Clinical Social Work Assessment  Patient Details  Name: Vickie Howard MRN: [id number] Date of Birth: 03/06/1936  Date of referral:  10/13/14               Reason for consult:  Facility Placement                Permission sought to share information with:  Facility Sport and exercise psychologist, Family Supports Permission granted to share information::  Yes, Verbal Permission Granted  Name::      Advertising account planner::   [employer] [address]  Relationship::   [place]   Contact Information:     Housing/Transportation Living arrangements for the past 2 months:  [place] of Information:  Patient, Adult Children Patient Interpreter Needed:  None Criminal Activity/Legal Involvement Pertinent to Current Situation/Hospitalization:  No - Comment as needed Significant Relationships:  Adult Children Lives with:  Self Do you feel safe going back to the place where you live?  Yes Need for family participation in patient care:  No (Coment)  Social Worker assessment / plan: Holiday representative (CSW) met with patient and her son Coralyn Mark was at bedside. Patient is familiar to CSW from last admission in November 2015. CSW introduced self and explained role of CSW department. Patient reported that she lives alone in [place]. CSW explained SNF process. Patient is agreeable to SNF search and prefers [facility]. Patient has been to [facility] in the past. CSW explained that [facility] authorization will have to be received before patient goes to [place].   Employment status:  Retired Forensic scientist:    PT Recommendations:  [place] / Referral to community resources:  [place]  Patient/Family's Response to care: Patient and son Coralyn Mark are agreeable to [facility] and prefer [facility].   Emotional Assessment Appearance:  Appears stated age Attitude/Demeanor/Rapport:    Affect (typically observed):  Accepting,  Calm Orientation:  Oriented to Self, Oriented to Place, Oriented to  Time, Oriented to Situation Alcohol / Substance use:  Not Applicable Psych involvement (Current and /or in the community):  No (Comment)  Discharge Needs  Concerns to be addressed:  Discharge Planning Concerns Readmission within the last 30 days:  No Current discharge risk:  Lives alone Barriers to Discharge:  [place], LCSW 10/13/2014, 2:43 PM

## 2014-10-13 NOTE — Evaluation (Signed)
Occupational Therapy Evaluation Patient Details Name: Vickie Howard MRN: [id number] DOB: September 21, 1935 Today's Date: 10/13/2014    History of Present Illness Pt is a 79yo white female who presents on POD1 s/p L THA (posterolateral). 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    Clinical Impression   Patient is eager to return to PLOF which is living at home alone in a condo community.  She has 3 daughters and 1 son in the area to help as needed.  She has a 3 in 1 commode over the toilet, reacher and sock aid, and walker.  She was using reacher and sock aid after last hip surgery to R side 11/15.  She would benefit from skilled OT to work on functional mobility for ADLs, instruction in [place] and sock aid for dressing skills while following posterior hip precautions, and problem solving to increase safety and prevent falls at home after going to SNF for rehab.      Follow Up Recommendations  SNF    Equipment Recommendations       Recommendations for Other Services PT consult     Precautions / Restrictions Precautions Precautions: Posterior Hip Precaution Booklet Issued: Yes (comment) Restrictions Weight Bearing Restrictions: Yes LLE Weight Bearing: Weight bearing as tolerated      Mobility Bed Mobility                  Transfers                      Balance                                            ADL Overall ADL's : Needs assistance/impaired Eating/Feeding: Independent   Grooming: Wash/dry hands;Wash/dry face;Oral care;Applying deodorant;Brushing hair;Independent   Upper Body Bathing: Independent   Lower Body Bathing: Maximal assistance;Set up;With adaptive equipment   Upper Body Dressing : Independent   Lower Body Dressing: Maximal assistance;With adaptive equipment;Adhering to hip precautions                 General ADL Comments: Pt already has  a reacher and sock aid at home and has been using since last hip surgery in November      Vision     Perception     Praxis      Pertinent Vitals/Pain Pain Assessment: 0-10 Pain Score: 8  Pain Location: L hip Pain Descriptors / Indicators: Aching;Constant Pain Intervention(s): Limited activity within patient's tolerance;Monitored during session;Premedicated before session     Hand Dominance     Extremity/Trunk Assessment Upper Extremity Assessment Upper Extremity Assessment: Overall WFL for tasks assessed   Lower Extremity Assessment Lower Extremity Assessment: Defer to PT evaluation       Communication Communication Communication: No difficulties   Cognition Arousal/Alertness: Awake/alert Behavior During Therapy: WFL for tasks assessed/performed Overall Cognitive Status: Within Functional Limits for tasks assessed                     General Comments       Exercises       Shoulder Instructions      Home Living Family/patient expects to be discharged to:: Skilled nursing facility (prefers [place]) Living Arrangements: Alone  Prior Functioning/Environment Level of Independence: Independent with assistive device(s)             OT Diagnosis: Generalized weakness;Acute pain   OT Problem List: Decreased strength;Decreased range of motion;Decreased activity tolerance   OT Treatment/Interventions: Self-care/ADL training;Therapeutic activities;DME and/or AE instruction    OT Goals(Current goals can be found in the care plan section) Acute Rehab OT Goals Patient Stated Goal: DC to [facility] (has been there before)  OT Goal Formulation: With patient/family Time For Goal Achievement: 10/27/14 Potential to Achieve Goals: Good  OT Frequency: Min 2X/week   Barriers to D/C:            Co-evaluation              End of Session    Activity Tolerance: Patient limited by pain Patient  left: in bed;with call bell/phone within reach;with bed alarm set;with family/visitor present   Time: 3112-1624 OT Time Calculation (min): 28 min Charges:  OT General Charges $OT Visit: 1 Procedure OT Evaluation $Initial OT Evaluation Tier I: 1 Procedure OT Treatments $Self Care/Home Management : 8-22 mins G-Codes:    Howard,Vickie Nov 07, 2014, 2:59 PM    Chrys Racer, OTR/L

## 2014-10-13 NOTE — Evaluation (Signed)
Physical Therapy Evaluation Patient Details Name: Vickie Howard MRN: [id number] DOB: [birth year] Today's Date: 10/13/2014   History of Present Illness  Pt is a 79yo white female who presents on POD1 s/p L THA (posterolateral). 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   Clinical Impression  Patient presents with impairment of strength, pain, range of motion, and activity tolerance, limiting ability to perform ADL, IADL, and ambulation. Patient will benefit from skilled intervention to address the above impairments and limitations, in order to restore to prior level of function and to decrease caregiver burden.      Follow Up Recommendations SNF;Supervision for mobility/OOB    Equipment Recommendations   (Pt has rollator, toilet riser, and RW. )    Recommendations for Other Services       Precautions / Restrictions Precautions Precautions: Posterior Hip Precaution Booklet Issued: Yes (comment) Precaution Comments: LLE resting posture in slight internal rotation, positioned to offset toward neutral.  Restrictions Weight Bearing Restrictions: Yes LLE Weight Bearing: Weight bearing as tolerated      Mobility  Bed Mobility Overal bed mobility: Modified Independent             General bed mobility comments: Required 2 minutes sitting EOB secondary to c/o 'wooziness,' which did not resolve.   Transfers Overall transfer level: Needs assistance Equipment used: Rolling walker (2 wheeled) Transfers: Sit to/from Stand Sit to Stand: Min guard;From elevated surface         General transfer comment: Pt not tolerating well, will worsening lightheadedness and feeling hot.   Ambulation/Gait Ambulation/Gait assistance:  (Not performed due to poor tolerance with other mobility. )              Stairs            Wheelchair Mobility    Modified Rankin (Stroke Patients Only)       Balance  Overall balance assessment: No apparent balance deficits (not formally assessed);History of Falls (Reports a single fall in last year; no injury, uncomplicated. )                                           Pertinent Vitals/Pain Pain Assessment: 0-10 Pain Score: 8  (better after mobility and repositioning ) Pain Location: L hip  Pain Descriptors / Indicators: Aching Pain Intervention(s): Limited activity within patient's tolerance;Monitored during session;Ice applied    Home Living                        Prior Function Level of Independence: Independent with assistive device(s)               Hand Dominance        Extremity/Trunk Assessment   Upper Extremity Assessment: Overall WFL for tasks assessed           Lower Extremity Assessment: LLE deficits/detail   LLE Deficits / Details: Generally weak (especially in ER) and inhibited by pain     Communication   Communication: No difficulties  Cognition Arousal/Alertness: Awake/alert Behavior During Therapy: WFL for tasks assessed/performed Overall Cognitive Status: Within Functional Limits for tasks assessed                      General Comments      Exercises Total  Joint Exercises Ankle Circles/Pumps: AROM;Both;15 reps Gluteal Sets: AROM;Both;10 reps Heel Slides: AAROM;Left;15 reps Other Exercises Other Exercises: External rotation- clam shells in hooklying x10       Assessment/Plan    PT Assessment Patient needs continued PT services  PT Diagnosis Difficulty walking;Abnormality of gait;Generalized weakness;Acute pain   PT Problem List Decreased strength;Decreased range of motion;Decreased activity tolerance;Decreased mobility;Pain  PT Treatment Interventions Therapeutic exercise;Therapeutic activities;Gait training;DME instruction   PT Goals (Current goals can be found in the Care Plan section) Acute Rehab PT Goals Patient Stated Goal: DC to [facility] (has been  there before)  PT Goal Formulation: With patient Time For Goal Achievement: 10/27/14 Potential to Achieve Goals: Good    Frequency BID   Barriers to discharge   Lives alone, limited social support    Co-evaluation               End of Session Equipment Utilized During Treatment: Gait belt Activity Tolerance: Other (comment) (Limited by worsening lightheadedness with activity. "I'm going to pass out.") Patient left: in chair;with call bell/phone within reach;with chair alarm set;with family/visitor present Nurse Communication: Other (comment)         Time: 5537-4827 PT Time Calculation (min) (ACUTE ONLY): 30 min   Charges:   PT Evaluation $Initial PT Evaluation Tier I: 1 Procedure PT Treatments $Therapeutic Exercise: 23-37 mins   PT G Codes:        Vickie Howard C 10/25/14, 10:06 AM Vickie Howard, PT, DPT, BM

## 2014-10-13 NOTE — Progress Notes (Signed)
Pt up in chair x 2 today , medicated for pain with po meds  tol well

## 2014-10-13 NOTE — Care Management Note (Signed)
Case Management Note  Patient Details  Name: Vickie Howard MRN: [id number] Date of Birth: 1935-12-12  Subjective/Objective:                   Patient resting comfortably in bedside recliner with son at bedside. She would like to go to [facility] for rehab and has been there before. She used [facility] in the past and was also happy with them. She has a front-wheeled walker in the home. She lives alone. She uses [facility] [address] for Rx.  Action/Plan: CSW aware of PT recommendation for SNF.   Expected Discharge Date:  10/15/14               Expected Discharge Plan:  Skilled Nursing Facility  In-House Referral:  Clinical Social Work  Discharge planning Services  CM Consult  Post Acute Care Choice:    Choice offered to:  Patient, Adult Children  DME Arranged:    DME Agency:     HH Arranged:  PT [place]:  [place]  Status of Service:  In process, will continue to follow  Medicare Important Message Given:    Date Medicare IM Given:    Medicare IM give by:    Date Additional Medicare IM Given:    Additional Medicare Important Message give by:     If discussed at [place] of Stay Meetings, dates discussed:    Additional Comments:  Marshell Garfinkel, RN 10/13/2014, 10:47 AM

## 2014-10-13 NOTE — Clinical Social Work Placement (Signed)
   CLINICAL SOCIAL WORK PLACEMENT  NOTE  Date:  10/13/2014  Patient Details  Name: Vickie Howard MRN: [id number] Date of Birth: 03-Jun-1936  Clinical Social Work is seeking post-discharge placement for this patient at the [place] level of care (*CSW will initial, date and re-position this form in  chart as items are completed):  Yes   Patient/family provided with [place] Work Department's list of facilities offering this level of care within the geographic area requested by the patient (or if unable, by the patient's family).  Yes   Patient/family informed of their freedom to choose among providers that offer the needed level of care, that participate in Medicare, Medicaid or managed care program needed by the patient, have an available bed and are willing to accept the patient.  Yes   Patient/family informed of [place]'s ownership interest in [facility] and [facility], as well as of the fact that they are under no obligation to receive care at these facilities.  PASRR submitted to EDS on 10/13/14     PASRR number received on 10/13/14     Existing PASRR number confirmed on       FL2 transmitted to all facilities in geographic area requested by pt/family on 10/13/14     FL2 transmitted to all facilities within larger geographic area on       Patient informed that his/her managed care company has contracts with or will negotiate with certain facilities, including the following:            Patient/family informed of bed offers received.  Patient chooses bed at       Physician recommends and patient chooses bed at      Patient to be transferred to   on  .  Patient to be transferred to facility by       Patient family notified on   of transfer.  Name of family member notified:        PHYSICIAN       Additional Comment:    _______________________________________________ Loralyn [place], LCSW 10/13/2014, 2:41 PM

## 2014-10-13 NOTE — Progress Notes (Signed)
Blue Medicare authorization has been received from [facility]. Auth # [id number] RVA. Patient has been approved for 5 days next review date is Monday 10/19/14. Patient has bed at [facility]. Blue Medicare authorization will expire in 48 hours. CSW will continue to follow and assist as needed.   Blima Rich, [place] [phone]

## 2014-10-13 NOTE — Progress Notes (Signed)
Clinical Education officer, museum (CSW) presented bed offers. Patient chose [facility]. Blue Medicare authorization was started today. Clinicals were faxed in to [facility]. CSW will continue to follow and assist as needed.   Blima Rich, [place] [phone]

## 2014-10-13 NOTE — Progress Notes (Addendum)
Initial Nutrition Assessment  DOCUMENTATION CODES:   INTERVENTION: Medical Nutrition Supplement: Ensure Enlive (each supplement provides 350kcal and 20 grams of protein) BID between meals Meals and Snacks: Cater to patient preferences   NUTRITION DIAGNOSIS:  Inadequate oral intake related to nausea, chronic illness as evidenced by per patient/family report, energy intake < 75% for > or equal to 3 months.   GOAL: Energy Intake: Patient will meet greater than or equal to 90% of their needs with meals and supplements  MONITOR:  PO intake, Supplement acceptance, Skin, Weight trends, Labs  REASON FOR ASSESSMENT:  Consult Poor PO  ASSESSMENT: Reason For Admission: s/p total hip arthroplasty PMHx: none significant Typical Fluid/ Food Intake: 100% intake of yesterday's lunch recorded per I/O Meal/ Snack Patterns: patient reports a decreased appetite/ intake for "a good while"- 3-6 months. She has supplemented her diet with Ensure BID x last 6 months since 1st hip surgery. Supplements: Ensure BID- Chocolate  Labs: Reviewed  Meds: Lipitor,FeSO4, MVI, NS @ 100  UOP: 1755 ml/ 24 hrs  Physical Findings: n/a Weight Changes: Patient reports a UBW of 145#, last known 3-4 years ago. Reviewed hospital records from Nov 2015, patient's weight was 114#. Current weight represents a 9.6% weight loss x 6 months- not significant, but is concerning considering patient's decreased appetite/ intake.  Height:  Ht Readings from Last 1 Encounters:  10/12/14 5' (1.524 m)    Weight:  Wt Readings from Last 1 Encounters:  10/12/14 103 lb (46.72 kg)    Ideal Body Weight:     Wt Readings from Last 10 Encounters:  10/12/14 103 lb (46.72 kg)  09/29/14 105 lb (47.628 kg)    BMI:  Body mass index is 20.12 kg/(m^2).  Estimated Nutritional Needs:  Kcal:  1049-1259 kcal/ day (BEE: 875 x 1.2 AF x 1.0-1.2 IF)  Protein:  47-56 g Pro/day (1.0-1.2 g Pro/day)  Fluid:  1170-1404 ml/ day (25-30 ml/  kg)  Skin:  Reviewed, no issues  Diet Order:  Diet regular Room service appropriate?: Yes; Fluid consistency:: Thin Diet - low sodium heart healthy  EDUCATION NEEDS:  No education needs identified at this time   Intake/Output Summary (Last 24 hours) at 10/13/14 1113 Last data filed at 10/13/14 1012  Gross per 24 hour  Intake 3246.67 ml  Output   2123 ml  Net 1123.67 ml    Last BM:  5/8  Roda Shutters, RDN Pager: [phone] Office: [place]

## 2014-10-13 NOTE — Progress Notes (Signed)
  Estimated Subjective: 1 Day Post-Op Procedure(s) (LRB): TOTAL HIP ARTHROPLASTY (Left) Patient reports pain as 6 on 0-10 scale.   Patient is well, and has had no acute complaints or problems We will start therapy today.  Plan is to go Rehab after hospital stay. no nausea and no vomiting Patient denies any chest pains or shortness of breath. Objective: Vital signs in last 24 hours: Temp:  [97.2 F (36.2 C)-98.2 F (36.8 C)] 98 F (36.7 C) (05/10 0342) Pulse Rate:  [25-190] 68 (05/10 0342) Resp:  [16-30] 18 (05/10 0342) BP: (66-150)/(18-147) 104/55 mmHg (05/10 0342) SpO2:  [95 %-100 %] 96 % (05/10 0342) well approximated incision Heels are non tender and elevated off the bed using rolled towels Intake/Output from previous day: 05/09 0701 - 05/10 0700 In: 4346.7 [P.O.:1110; I.V.:2786.7; IV Piggyback:450] Out: 2548 [BTDVV:6160; Drains:718; Blood:75] Intake/Output this shift:     Recent Labs  10/13/14 0440  HGB 8.2*    Recent Labs  10/13/14 0440  WBC 8.9  RBC 2.28*  HCT 25.0*  PLT 121*    Recent Labs  10/13/14 0440  NA 141  K 4.0  CL 108  CO2 29  BUN 16  CREATININE 0.69  GLUCOSE 100*  CALCIUM 8.5*   No results for input(s): LABPT, INR in the last 72 hours.  EXAM General - Patient is Alert, Appropriate and Oriented Extremity - Neurologically intact Neurovascular intact Sensation intact distally Intact pulses distally Dorsiflexion/Plantar flexion intact Dressing - Patient still has original dressing on Motor Function - intact, moving foot and toes well on exam. Moving lower extremity well.  Past Medical History  Diagnosis Date  . Dysrhythmia     a fib  . Heart murmur   . History of hiatal hernia   . Arthritis   . Occasional tremors     hands    Assessment/Plan: 1 Day Post-Op Procedure(s) (LRB): TOTAL HIP ARTHROPLASTY (Left) Active Problems:   S/P total hip arthroplasty  Estimated body mass index is 20.12 kg/(m^2) as calculated from the  following:   Height as of this encounter: 5' (1.524 m).   Weight as of this encounter: 46.72 kg (103 lb). Advance diet Up with therapy Discharge to SNF on Thursday if medically stable  Labs: CBC platelet, hemoglobin tomorrow morning DVT Prophylaxis - Lovenox, Foot Pumps and TED hose Weight-Bearing as tolerated to left leg D/C O2 and Pulse OX and try on Room [employer] R. [place] 10/13/2014, 7:19 AM

## 2014-10-13 NOTE — Progress Notes (Signed)
Physical Therapy Treatment Patient Details Name: Vickie Howard MRN: [id number] DOB: 1935-07-02 Today's Date: 10/13/2014    History of Present Illness Pt is a 79yo white female who presents on POD1 s/p L THA (posterolateral). 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     PT Comments    Pt progressing toward goals as evidenced by ability to tolerate additional ambulation this session, and decreased c/o nausea and lightheadedness with activity. Patient presents with impairment of strength, pain, range of motion, and activity tolerance, limiting ability to perform ADL, IADL, and ambulation. Patient will benefit from skilled intervention to address the above impairments and limitations, in order to restore to prior level of function and to decrease caregiver burden.    Follow Up Recommendations  SNF;Supervision for mobility/OOB     Equipment Recommendations  Rolling walker with 5" wheels    Recommendations for Other Services       Precautions / Restrictions Precautions Precautions: Posterior Hip Precaution Booklet Issued: Yes (comment) Restrictions Weight Bearing Restrictions: Yes LLE Weight Bearing: Weight bearing as tolerated    Mobility  Bed Mobility Overal bed mobility: Modified Independent             General bed mobility comments: Denies dizziness or nausea with sitting EOB.   Transfers Overall transfer level: Needs assistance Equipment used: Rolling walker (2 wheeled) Transfers: Stand Pivot Transfers;Sit to/from Stand Sit to Stand: Min guard Stand pivot transfers: Min guard       General transfer comment: Good strength, demo good safety awareness.   Ambulation/Gait Ambulation/Gait assistance: Min guard Ambulation Distance (Feet): 45 Feet Assistive device: Rolling walker (2 wheeled) Gait Pattern/deviations: Decreased step length - right;Decreased step length - left   Gait velocity  interpretation: <1.8 ft/sec, indicative of risk for recurrent falls     Stairs            Wheelchair Mobility    Modified Rankin (Stroke Patients Only)       Balance Overall balance assessment: Modified Independent;No apparent balance deficits (not formally assessed)                                  Cognition Arousal/Alertness: Awake/alert Behavior During Therapy: WFL for tasks assessed/performed Overall Cognitive Status: Within Functional Limits for tasks assessed       Memory: Decreased recall of precautions (2 of 3 recalled. )              Exercises Total Joint Exercises Gluteal Sets: 15 reps;AROM;Both;Supine (better activation in PM session. ) Other Exercises Other Exercises: Resisted external rot- clam shells in seated x12     General Comments General comments (skin integrity, edema, etc.): mild edema in L thigh      Pertinent Vitals/Pain Pain Assessment: 0-10 Pain Score: 8  Pain Location: L hip Pain Descriptors / Indicators: Burning (Left thigh feels 'hot' ) Pain Intervention(s): Monitored during session;Repositioned    Home Living Family/patient expects to be discharged to:: Skilled nursing facility (prefers [facility]) Living Arrangements: Alone                  Prior Function Level of Independence: Independent with assistive device(s)          PT Goals (current goals can now be found in the care plan section) Acute Rehab PT Goals Patient Stated Goal: DC to [facility] (has  been there before)  PT Goal Formulation: With patient Time For Goal Achievement: 10/27/14 Potential to Achieve Goals: Good Progress towards PT goals: Progressing toward goals    Frequency  BID    PT Plan Current plan remains appropriate    Co-evaluation             End of Session Equipment Utilized During Treatment: Gait belt Activity Tolerance: Patient tolerated treatment well;No increased pain;Patient limited by fatigue Patient  left: in chair;with call bell/phone within reach;with chair alarm set     Time: [phone] PT Time Calculation (min) (ACUTE ONLY): 24 min  Charges:  $Therapeutic Exercise: 8-22 mins $Therapeutic Activity: 23-37 mins                    G Codes:      Phuc Kluttz C 2014-11-12, 3:31 PM  Etta Grandchild, PT, DPT, BM

## 2014-10-13 NOTE — Discharge Summary (Signed)
Physician Discharge Summary  Patient ID: Vickie Howard MRN: [id number] DOB/AGE: July 11, 1935 79 y.o.  Admit date: 10/12/2014 Discharge date: 10/15/2014 Admission Diagnoses:  OA   Discharge Diagnoses: Patient Active Problem List   Diagnosis Date Noted  . S/P total hip arthroplasty 10/12/2014    Past Medical History  Diagnosis Date  . Dysrhythmia     a fib  . Heart murmur   . History of hiatal hernia   . Arthritis   . Occasional tremors     hands     Transfusion: Autovac transfusion first 6 hours postop   Consultants (if any):    Discharged Condition: Improved  Hospital Course: Vickie Howard is an 79 y.o. female who was admitted 10/12/2014 with a diagnosis of left hip OA and went to the operating room on 10/12/2014 and underwent the above named procedures. Patient was brought to the orthopedic floor for the PACU in stable condition. Postop day 1 patient's labs and vital signs were stable. The patient had medical history consistent with atrial fibrillation, this was monitored throughout her hospital stay. Patient progressed slowly with physical therapy. On postoperative day 3 patient was stable and ready for discharge to rehabilitation facility for continuation of physical therapy and occupational therapy.      Surgeries: Procedure(s): TOTAL HIP ARTHROPLASTY on 10/12/2014 Patient tolerated the surgery well. Taken to PACU where she was stabilized and then transferred to the orthopedic floor.  Started on Lovenox 30mg  q 12 hrs. Foot pumps applied bilaterally at 80 mm. Heels elevated on bed with rolled towels. No evidence of DVT. Negative Homan. Physical therapy started on day #1 for gait training and transfer. OT started day #1 for ADL and assisted devices.  Patient's IV , foley and hemovac was d/c on day #2.  Implants:  DePuy 15 mm small stature AML femoral stem, 50 mm OD Pinnacle 100 acetabular component, neutral Pinnacle ALTRX polyethylene insert, and a 32 mm CoCr hip ball  with a +1 mm neck length   ANESTHESIA: Spinal  ESTIMATED BLOOD LOSS: 75 mL  FLUIDS REPLACED: 1100 mL of crystalloid  DRAINS: 2 medium drains to a Hemovac reservoir    She was given perioperative antibiotics:  Anti-infectives    Start     Dose/Rate Route Frequency Ordered Stop   10/12/14 1230  ceFAZolin (ANCEF) IVPB 2 g/50 mL premix     2 g 100 mL/hr over 30 Minutes Intravenous Every 6 hours 10/12/14 1220 10/13/14 1559   10/12/14 0705  ceFAZolin (ANCEF) 2-3 GM-% IVPB SOLR    Comments:  Hallaji, Violet Ann: cabinet override      10/12/14 0705 10/12/14 1914   10/12/14 0606  ceFAZolin (ANCEF) IVPB 2 g/50 mL premix    Comments:  2 gram IV piggyback, once, infuse over 30 minutes;   On call to OR   2 g 100 mL/hr over 30 Minutes Intravenous On call to O.R. 10/12/14 1962 10/12/14 2297    .  She was given sequential compression devices, early ambulation, and Lovenox for DVT prophylaxis.  She benefited maximally from the hospital stay and there were no complications.    Recent vital signs:  Filed Vitals:   10/13/14 0342  BP: 104/55  Pulse: 68  Temp: 98 F (36.7 C)  Resp: 18    Recent laboratory studies:  Lab Results  Component Value Date   HGB 8.2* 10/13/2014   HGB 10.4* 09/29/2014   HGB 8.2* 05/02/2014   Lab Results  Component Value Date   WBC  8.9 10/13/2014   PLT 121* 10/13/2014   Lab Results  Component Value Date   INR 1.0 09/29/2014   Lab Results  Component Value Date   NA 141 10/13/2014   K 4.0 10/13/2014   CL 108 10/13/2014   CO2 29 10/13/2014   BUN 16 10/13/2014   CREATININE 0.69 10/13/2014   GLUCOSE 100* 10/13/2014    Discharge Medications:     Medication List    STOP taking these medications        aspirin EC 81 MG tablet      TAKE these medications        acetaminophen 500 MG tablet  Commonly known as:  TYLENOL  Take 1 tablet (500 mg total) by mouth every 6 (six) hours as needed for mild pain or fever.     atorvastatin 20 MG tablet   Commonly known as:  LIPITOR  Take 20 mg by mouth daily.     busPIRone 15 MG tablet  Commonly known as:  BUSPAR  Take 15 mg by mouth 2 (two) times daily. 0.5 to 1 bid prn     diltiazem 30 MG tablet  Commonly known as:  CARDIZEM  Take 30 mg by mouth every morning.     diltiazem 120 MG tablet  Commonly known as:  CARDIZEM  Take 120 mg by mouth every morning.     enoxaparin 30 MG/0.3ML injection  Commonly known as:  LOVENOX  Inject 0.3 mLs (30 mg total) into the skin every 12 (twelve) hours.     gabapentin 100 MG capsule  Commonly known as:  NEURONTIN  Take 100 mg by mouth 1 day or 1 dose.     Iron 142 (45 FE) MG Tbcr  Take 45 mg of iron by mouth 1 day or 1 dose.     meclizine 12.5 MG tablet  Commonly known as:  ANTIVERT  Take 12.5 mg by mouth 3 (three) times daily as needed for dizziness.     multivitamin with minerals tablet  Take 1 tablet by mouth daily. areds2     omeprazole 20 MG capsule  Commonly known as:  PRILOSEC  Take 20 mg by mouth daily.     oxyCODONE 5 MG immediate release tablet  Commonly known as:  Oxy IR/ROXICODONE  Take 1-2 tablets (5-10 mg total) by mouth every 4 (four) hours as needed for breakthrough pain.     sertraline 50 MG tablet  Commonly known as:  ZOLOFT  Take 50 mg by mouth daily.     traMADol 50 MG tablet  Commonly known as:  ULTRAM  Take 50 mg by mouth 6 (six) times daily. As needed        Diagnostic Studies: Dg Hip Port Unilat With Pelvis 1v Left  10/12/2014   CLINICAL DATA:  Status post left hip arthroplasty.  EXAM: LEFT HIP (WITH PELVIS) 1 VIEW PORTABLE  COMPARISON:  None.  FINDINGS: New left hip arthroplasty is well-seated and aligned. There is no acute fracture or evidence of an operative complication.  IMPRESSION: Well aligned left hip arthroplasty.   Electronically Signed   By: Lajean Manes M.D.   On: 10/12/2014 11:32    Disposition:       Discharge Instructions    Diet - low sodium heart healthy    Complete by:  As  directed      Increase activity slowly    Complete by:  As directed            Follow-up  Information    Please follow up.   Why:  Follow up with [facility] 2 weeks post op. Please call for appointment.       SignedDorise Hiss CHRISTOPHER 10/13/2014, 8:01 AM

## 2014-10-13 NOTE — Discharge Instructions (Signed)
POSTERIOR TOTAL HIP REPLACEMENT POSTOPERATIVE DIRECTIONS  Hip Rehabilitation, Guidelines Following Surgery  The results of a hip operation are greatly improved after range of motion and muscle strengthening exercises. Follow all safety measures which are given to protect your hip. If any of these exercises cause increased pain or swelling in your joint, decrease the amount until you are comfortable again. Then slowly increase the exercises. Call your caregiver if you have problems or questions.   HOME CARE INSTRUCTIONS  Remove items at home which could result in a fall. This includes throw rugs or furniture in walking pathways.   ICE to the affected hip every three hours for 30 minutes at a time and then as needed for pain and swelling.  Continue to use ice on the hip for pain and swelling from surgery. You may notice swelling that will progress down to the foot and ankle.  This is normal after surgery.  Elevate the leg when you are not up walking on it.    Continue to use the breathing machine which will help keep your temperature down.  It is common for your temperature to cycle up and down following surgery, especially at night when you are not up moving around and exerting yourself.  The breathing machine keeps your lungs expanded and your temperature down.  DIET You may resume your previous home diet once your are discharged from the hospital.  DRESSING / WOUND CARE / SHOWERING You may start showering once staples have been removed. Change dressing as needed.    ACTIVITY Walk with your walker as instructed. Use walker as long as suggested by your caregivers. Avoid periods of inactivity such as sitting longer than an hour when not asleep. This helps prevent blood clots.  You may resume a sexual relationship in one month or when given the OK by your doctor.  You may return to work once you are cleared by your doctor.  Do not drive a car for 6 weeks or until released by you surgeon.    Do not drive while taking narcotics.  WEIGHT BEARING Weight bearing as tolerated  POSTOPERATIVE CONSTIPATION PROTOCOL Constipation - defined medically as fewer than three stools per week and severe constipation as less than one stool per week.  One of the most common issues patients have following surgery is constipation.  Even if you have a regular bowel pattern at home, your normal regimen is likely to be disrupted due to multiple reasons following surgery.  Combination of anesthesia, postoperative narcotics, change in appetite and fluid intake all can affect your bowels.  In order to avoid complications following surgery, here are some recommendations in order to help you during your recovery period.  Colace (docusate) - Pick up an over-the-counter form of Colace or another stool softener and take twice a day as long as you are requiring postoperative pain medications.  Take with a full glass of water daily.  If you experience loose stools or diarrhea, hold the colace until you stool forms back up.  If your symptoms do not get better within 1 week or if they get worse, check with your doctor.  Dulcolax (bisacodyl) - Pick up over-the-counter and take as directed by the product packaging as needed to assist with the movement of your bowels.  Take with a full glass of water.  Use this product as needed if not relieved by Colace only.   MiraLax (polyethylene glycol) - Pick up over-the-counter to have on hand.  MiraLax is a  solution that will increase the amount of water in your bowels to assist with bowel movements.  Take as directed and can mix with a glass of water, juice, soda, coffee, or tea.  Take if you go more than two days without a movement. Do not use MiraLax more than once per day. Call your doctor if you are still constipated or irregular after using this medication for 7 days in a row.  If you continue to have problems with postoperative constipation, please contact the office for  further assistance and recommendations.  If you experience "the worst abdominal pain ever" or develop nausea or vomiting, please contact the office immediatly for further recommendations for treatment.  ITCHING  If you experience itching with your medications, try taking only a single pain pill, or even half a pain pill at a time.  You can also use Benadryl over the counter for itching or also to help with sleep.   TED HOSE STOCKINGS Wear the elastic stockings on both legs for six weeks following surgery during the day but you may remove then at night for sleeping.  MEDICATIONS See your medication summary on the After Visit Summary that the nursing staff will review with you prior to discharge.  You may have some home medications which will be placed on hold until you complete the course of blood thinner medication.  It is important for you to complete the blood thinner medication as prescribed by your surgeon.  Continue your approved medications as instructed at time of discharge.  PRECAUTIONS If you experience chest pain or shortness of breath - call 911 immediately for transfer to the hospital emergency department.  If you develop a fever greater that 101 F, purulent drainage from wound, increased redness or drainage from wound, foul odor from the wound/dressing, or calf pain - CONTACT YOUR SURGEON.                                                   FOLLOW-UP APPOINTMENTS Make sure you keep all of your appointments after your operation with your surgeon and caregivers. You should call the office at the above phone number and make an appointment for approximately two weeks after the date of your surgery or on the date instructed by your surgeon outlined in the "After Visit Summary".  RANGE OF MOTION AND STRENGTHENING EXERCISES  These exercises are designed to help you keep full movement of your hip joint. Follow your caregiver's or physical therapist's instructions. Perform all exercises about  fifteen times, three times per day or as directed. Exercise both hips, even if you have had only one joint replacement. These exercises can be done on a training (exercise) mat, on the floor, on a table or on a bed. Use whatever works the best and is most comfortable for you. Use music or television while you are exercising so that the exercises are a pleasant break in your day. This will make your life better with the exercises acting as a break in routine you can look forward to.  Lying on your back, slowly slide your foot toward your buttocks, raising your knee up off the floor. Then slowly slide your foot back down until your leg is straight again.  Lying on your back spread your legs as far apart as you can without causing discomfort.  Lying on your  side, raise your upper leg and foot straight up from the floor as far as is comfortable. Slowly lower the leg and repeat.  °Lying on your back, tighten up the muscle in the front of your thigh (quadriceps muscles). You can do this by keeping your leg straight and trying to raise your heel off the floor. This helps strengthen the largest muscle supporting your knee.  °Lying on your back, tighten up the muscles of your buttocks both with the legs straight and with the knee bent at a comfortable angle while keeping your heel on the floor.  ° ° ° ° °IF YOU ARE TRANSFERRED TO A SKILLED REHAB FACILITY °If the patient is transferred to a skilled rehab facility following release from the hospital, a list of the current medications will be sent to the facility for the patient to continue.  When discharged from the skilled rehab facility, please have the facility set up the patient's Home Health Physical Therapy prior to being released. Also, the skilled facility will be responsible for providing the patient with their medications at time of release from the facility to include their pain medication, the muscle relaxants, and their blood thinner medication. If the patient  is still at the rehab facility at time of the two week follow up appointment, the skilled rehab facility will also need to assist the patient in arranging follow up appointment in our office and any transportation needs. ° °MAKE SURE YOU:  °Understand these instructions.  °Get help right away if you are not doing well or get worse.  ° ° °Pick up stool softner and laxative for home use following surgery while on pain medications. °Continue to use ice for pain and swelling after surgery. °Do not use any lotions or creams on the incision until instructed by your surgeon. ° °

## 2014-10-14 ENCOUNTER — Encounter
Admission: RE | Admit: 2014-10-14 | Discharge: 2014-10-14 | Disposition: A | Discharge status: Home / Self Care | Payer: Medicare Other | Source: Ambulatory Visit | Attending: Internal Medicine | Admitting: Internal Medicine

## 2014-10-14 ENCOUNTER — Encounter: Payer: Self-pay | Admitting: Orthopedic Surgery

## 2014-10-14 DIAGNOSIS — E876 Hypokalemia: Secondary | ICD-10-CM | POA: Insufficient documentation

## 2014-10-14 DIAGNOSIS — D649 Anemia, unspecified: Secondary | ICD-10-CM | POA: Insufficient documentation

## 2014-10-14 LAB — BASIC METABOLIC PANEL
Anion gap: 4 — ABNORMAL LOW (ref 5–15)
BUN: 17 mg/dL (ref 6–20)
CALCIUM: 8.1 mg/dL — AB (ref 8.9–10.3)
CHLORIDE: 108 mmol/L (ref 101–111)
CO2: 26 mmol/L (ref 22–32)
Creatinine, Ser: 0.68 mg/dL (ref 0.44–1.00)
GFR calc Af Amer: >60 mL/min (ref >60)
GFR calc non Af Amer: >60 mL/min (ref >60)
Glucose, Bld: 101 mg/dL — ABNORMAL HIGH (ref 65–99)
Potassium: 3.8 mmol/L (ref 3.5–5.1)
Sodium: 138 mmol/L (ref 135–145)

## 2014-10-14 LAB — CBC
HCT: 22.3 % — ABNORMAL LOW (ref 35.0–47.0)
HEMOGLOBIN: 7.4 g/dL — AB (ref 12.0–16.0)
MCH: 36.1 pg — ABNORMAL HIGH (ref 26.0–34.0)
MCHC: 33.2 g/dL (ref 32.0–36.0)
MCV: 108.7 fL — ABNORMAL HIGH (ref 80.0–100.0)
Platelets: 113 10*3/uL — ABNORMAL LOW (ref 150–440)
RBC: 2.06 MIL/uL — ABNORMAL LOW (ref 3.80–5.20)
RDW: 15.2 % — ABNORMAL HIGH (ref 11.5–14.5)
WBC: 9.9 10*3/uL (ref 3.6–11.0)

## 2014-10-14 LAB — SURGICAL PATHOLOGY

## 2014-10-14 MED ORDER — LACTULOSE 10 GM/15ML PO SOLN
20.0000 g | Freq: Two times a day (BID) | ORAL | Status: DC | PRN
  Administered 2014-10-15: 20 g via ORAL
  Filled 2014-10-14: qty 30

## 2014-10-14 NOTE — Progress Notes (Signed)
Occupational Therapy Treatment Patient Details Name: Vickie Howard MRN: [id number] DOB: 12/25/1935 Today's Date: 10/14/2014    History of present illness Pt is a 79yo white female who presents on POD1 s/p L THA (posterolateral). 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    OT comments  Pt seen for ADL training while sitting up in chair for using reacher and sock aid for LB dressing. She stated pain was 8/10 but she would try to participate as much as possible.  She required minimal assist and cues for placement and completion and presents with decreased UE strength for pulling sock aid up leg and over foot.  Pt hesitant to lean forward at all in chair too.  She was able to recall 2/3 hip precautions.  Follow Up Recommendations       Equipment Recommendations   (pt has reacher and sock aid at home with [facility] over toilet and does only sponge baths at home)    Recommendations for Other Services      Precautions / Restrictions Precautions Precautions: Posterior Hip Restrictions Weight Bearing Restrictions: Yes LLE Weight Bearing: Weight bearing as tolerated       Mobility Bed Mobility               Transfers Overall transfer level: Needs assistance                 Balance                                   ADL Overall ADL's : Needs assistance/impaired                     Lower Body Dressing: Minimal assistance Lower Body Dressing Details (indicate cue type and reason): for reacher and sock aid for RLE only with legs elevated--pt did not want to put legs down in flexed position due to pain 8/10 in LLE               General ADL Comments: Pt progressing well with OT and appeared very sad today compared to yesterday but may be due to no family being in room with her.      Vision                     Perception     Praxis      Cognition   Behavior  During Therapy: [facility] for tasks assessed/performed Overall Cognitive Status: Within Functional Limits for tasks assessed       Memory: Decreased recall of precautions               Extremity/Trunk Assessment               Exercises Total Joint Exercises Ankle Circles/Pumps: AROM;Both;20 reps (long sit) Quad Sets: Strengthening;Both;20 reps (long sit) Gluteal Sets: Strengthening;Both;20 reps (long sit) Towel Squeeze: Strengthening;Both;20 reps (long sit) Heel Slides: AAROM;Left;20 reps (long sit partial range; AROM R 20x)   Shoulder Instructions       General Comments      Pertinent Vitals/ Pain       Pain Assessment: 0-10 Pain Score: 8  Pain Location: L hip Pain Descriptors / Indicators: Burning;Constant;Aching Pain Intervention(s): Limited activity within patient's tolerance;Premedicated before session;Monitored during session;Repositioned  Home Living  Prior Functioning/Environment              Frequency Min 2X/week     Progress Toward Goals  OT Goals(current goals can now be found in the care plan section)  Progress towards OT goals: Progressing toward goals     Plan Discharge plan remains appropriate    Co-evaluation                 End of Session Equipment Utilized During Treatment:  (reacher and sock aid)   Activity Tolerance Patient limited by pain   Patient Left in chair;with call bell/phone within reach;with chair alarm set   Nurse Communication          Time: 1005-1030 OT Time Calculation (min): 25 min  Charges: OT General Charges $OT Visit: 1 Procedure OT Treatments $Self Care/Home Management : 23-37 mins  [name] 10/14/2014, 10:50 AM    Chrys Racer, OTR/L

## 2014-10-14 NOTE — Progress Notes (Signed)
Physical Therapy Treatment Patient Details Name: KARLENE SOUTHARD MRN: [id number] DOB: 06-11-1935 Today's Date: 10/14/2014    History of Present Illness Pt is a 79yo white female who presents on POD1 s/p L THA (posterolateral). 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     PT Comments    Pt improving ambulation distance. Requires cueing to avoid toe in on turns. Reinforced hip precautions this p.m.  Follow Up Recommendations  SNF     Equipment Recommendations  Rolling walker with 5" wheels    Recommendations for Other Services       Precautions / Restrictions Precautions Precautions: Posterior Hip Precaution Comments: Pillows to support B hips in neutral Restrictions Weight Bearing Restrictions: Yes LLE Weight Bearing: Weight bearing as tolerated    Mobility  Bed Mobility Overal bed mobility: Needs Assistance Bed Mobility: Sit to Supine       Sit to supine: Min assist      Transfers Overall transfer level: Needs assistance Equipment used: Rolling walker (2 wheeled) Transfers: Sit to/from Stand Sit to Stand: Min guard (slight increased time from recliner)         General transfer comment: Good safety  Ambulation/Gait Ambulation/Gait assistance: Min guard Ambulation Distance (Feet): 50 Feet Assistive device: Rolling walker (2 wheeled) Gait Pattern/deviations: Step-through pattern;Decreased step length - right;Decreased stance time - left;Decreased dorsiflexion - right;Decreased dorsiflexion - left;Decreased weight shift to left (Partial step through)   Gait velocity interpretation: <1.8 ft/sec, indicative of risk for recurrent falls     Stairs            Wheelchair Mobility    Modified Rankin (Stroke Patients Only)       Balance                                    Cognition Arousal/Alertness: Awake/alert Behavior During Therapy: WFL for tasks  assessed/performed Overall Cognitive Status: Within Functional Limits for tasks assessed       Memory: Decreased recall of precautions              Exercises Total Joint Exercises Ankle Circles/Pumps: AROM;Both;20 reps;Supine Quad Sets: Strengthening;Both;20 reps;Supine Gluteal Sets: Strengthening;Both;20 reps;Supine Towel Squeeze: Strengthening;Both;20 reps;Supine Short Arc Quad: AAROM;Both;20 reps;Supine Heel Slides: AAROM;20 reps;Supine;Both (Partial range L ) Hip ABduction/ADduction: AAROM;Both;20 reps;Supine Other Exercises Other Exercises: Supine straight leg ER, B 20x    General Comments        Pertinent Vitals/Pain Pain Assessment: 0-10 Pain Score: 8  Pain Location: L hip Pain Descriptors / Indicators: Burning;Constant;Aching Pain Intervention(s): Premedicated before session;Monitored during session    Home Living                      Prior Function            PT Goals (current goals can now be found in the care plan section) Progress towards PT goals: Progressing toward goals    Frequency  BID    PT Plan Current plan remains appropriate    Co-evaluation             End of Session Equipment Utilized During Treatment: Gait belt Activity Tolerance: Patient tolerated treatment well;No increased pain;Patient limited by fatigue Patient left: in bed;with call bell/phone within reach;with bed alarm set     Time: 1331-1402 PT Time Calculation (min) (ACUTE ONLY):  31 min  Charges:  $Gait Training: 8-22 mins $Therapeutic Exercise: 8-22 mins                    G Codes:      [name] 10/14/2014, 2:20 PM

## 2014-10-14 NOTE — Progress Notes (Signed)
   Subjective: 2 Days Post-Op Procedure(s) (LRB): TOTAL HIP ARTHROPLASTY (Left) Patient reports pain as 2 on 0-10 scale.   Patient is well, and has had no acute complaints or problems We will start therapy today.  Plan is to go Skilled nursing facility after hospital stay. no nausea and no vomiting Patient denies any chest pains or shortness of breath. Objective: Vital signs in last 24 hours: Temp:  [98.4 F (36.9 C)-100.5 F (38.1 C)] 100.5 F (38.1 C) (05/11 0436) Pulse Rate:  [58-156] 110 (05/11 0436) Resp:  [17-18] 17 (05/11 0436) BP: (82-110)/(42-79) 93/79 mmHg (05/11 0436) SpO2:  [89 %-96 %] 89 % (05/11 0436) well approximated incision Heels are non tender and elevated off the bed using rolled towels Intake/Output from previous day: 05/10 0701 - 05/11 0700 In: 2170 [P.O.:360; I.V.:1710; IV Piggyback:50] Out: 400 [Urine:350; Drains:50] Intake/Output this shift:     Recent Labs  10/13/14 0440  HGB 8.2*    Recent Labs  10/13/14 0440  WBC 8.9  RBC 2.28*  HCT 25.0*  PLT 121*    Recent Labs  10/13/14 0440 10/14/14 0452  NA 141 138  K 4.0 3.8  CL 108 108  CO2 29 26  BUN 16 17  CREATININE 0.69 0.68  GLUCOSE 100* 101*  CALCIUM 8.5* 8.1*   No results for input(s): LABPT, INR in the last 72 hours.  EXAM General - Patient is Alert, Appropriate and Oriented Extremity - Neurologically intact Neurovascular intact Sensation intact distally Intact pulses distally Dorsiflexion/Plantar flexion intact Dressing - dressing C/D/I Motor Function - intact, moving foot and toes well on exam. Able to do straight leg raises on her own  Past Medical History  Diagnosis Date  . Dysrhythmia     a fib  . Heart murmur   . History of hiatal hernia   . Arthritis   . Occasional tremors     hands    Assessment/Plan: 2 Days Post-Op Procedure(s) (LRB): TOTAL HIP ARTHROPLASTY (Left) Active Problems:   S/P total hip arthroplasty  Estimated body mass index is 20.12  kg/(m^2) as calculated from the following:   Height as of this encounter: 5' (1.524 m).   Weight as of this encounter: 46.72 kg (103 lb). Up with therapy Discharge to SNF probably Thursday  Labs: No labs at this time DVT Prophylaxis - Lovenox, Foot Pumps and TED hose Weight-Bearing as tolerated to left leg D/C O2 and Pulse OX and try on Room [employer] R. [place] 10/14/2014, 7:14 AM

## 2014-10-14 NOTE — Progress Notes (Signed)
Patient has a bed at [facility]. Clinical Education officer, museum (CSW) made [employer] [facility] [employer] aware that [employer] authorization has been received. CSW will continue to follow and assist as needed.   Blima Rich, [place] [phone]

## 2014-10-14 NOTE — Progress Notes (Signed)
Up in chair x one tol well , pain controlled with po meds, family at [facility], Dr Marry Guan informed of fever meds given incentive spirometer encouraged pt effort 500 goal 1000

## 2014-10-14 NOTE — Progress Notes (Signed)
Physical Therapy Treatment Patient Details Name: Vickie Howard MRN: [id number] DOB: May 04, 1936 Today's Date: 10/14/2014    History of Present Illness Pt is a 79yo white female who presents on POD1 s/p L THA (posterolateral). 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     PT Comments    Pt very pleasant/cooperative; noted to have a sad appearing disposition. Progressing well towards goals. Did not wish further ambulation this a.m. Session. Will plan for increased ambulation and exercises this p.m.   Follow Up Recommendations  SNF     Equipment Recommendations  Rolling walker with 5" wheels    Recommendations for Other Services       Precautions / Restrictions Precautions Precautions: Posterior Hip Restrictions Weight Bearing Restrictions: Yes LLE Weight Bearing: Weight bearing as tolerated    Mobility  Bed Mobility Overal bed mobility: Needs Assistance Bed Mobility: Supine to Sit     Supine to sit: Min assist        Transfers Overall transfer level: Needs assistance Equipment used: Rolling walker (2 wheeled) Transfers: Sit to/from Stand Sit to Stand: Min guard            Ambulation/Gait Ambulation/Gait assistance: Min guard Ambulation Distance (Feet): 6 Feet Assistive device: Rolling walker (2 wheeled) Gait Pattern/deviations: Step-to pattern;Decreased step length - right;Decreased step length - left;Decreased stance time - left;Decreased weight shift to left   Gait velocity interpretation: <1.8 ft/sec, indicative of risk for recurrent falls     Stairs            Wheelchair Mobility    Modified Rankin (Stroke Patients Only)       Balance                                    Cognition Arousal/Alertness: Awake/alert Behavior During Therapy: WFL for tasks assessed/performed Overall Cognitive Status: Within Functional Limits for tasks assessed        Memory: Decreased recall of precautions              Exercises Total Joint Exercises Ankle Circles/Pumps: AROM;Both;20 reps (long sit) Quad Sets: Strengthening;Both;20 reps (long sit) Gluteal Sets: Strengthening;Both;20 reps (long sit) Towel Squeeze: Strengthening;Both;20 reps (long sit) Heel Slides: AAROM;Left;20 reps (long sit partial range; AROM R 20x)    General Comments        Pertinent Vitals/Pain Pain Assessment: 0-10 Pain Score: 2  Pain Location: L hip Pain Descriptors / Indicators: Aching Pain Intervention(s): Premedicated before session    Home Living                      Prior Function            PT Goals (current goals can now be found in the care plan section) Progress towards PT goals: Progressing toward goals    Frequency  BID    PT Plan Current plan remains appropriate    Co-evaluation             End of Session Equipment Utilized During Treatment: Gait belt Activity Tolerance: Patient tolerated treatment well;No increased pain;Patient limited by fatigue Patient left: in chair;with call bell/phone within reach;with chair alarm set     Time: 6503-5465 PT Time Calculation (min) (ACUTE ONLY): 30 min  Charges:  $Gait Training: 8-22 mins $Therapeutic Exercise: 8-22 mins  G Codes:      [name] 10/14/2014, 10:27 AM

## 2014-10-15 ENCOUNTER — Encounter: Payer: Self-pay | Admitting: Occupational Medicine

## 2014-10-15 ENCOUNTER — Emergency Department: Payer: Medicare Other

## 2014-10-15 ENCOUNTER — Inpatient Hospital Stay
Admission: EM | Admit: 2014-10-15 | Discharge: 2014-10-20 | DRG: 309 | Disposition: A | Discharge status: Skilled Nursing Facility | Payer: Medicare Other | Attending: Internal Medicine | Admitting: Internal Medicine

## 2014-10-15 DIAGNOSIS — Z79891 Long term (current) use of opiate analgesic: Secondary | ICD-10-CM

## 2014-10-15 DIAGNOSIS — I480 Paroxysmal atrial fibrillation: Secondary | ICD-10-CM | POA: Diagnosis not present

## 2014-10-15 DIAGNOSIS — I10 Essential (primary) hypertension: Secondary | ICD-10-CM | POA: Diagnosis present

## 2014-10-15 DIAGNOSIS — R778 Other specified abnormalities of plasma proteins: Secondary | ICD-10-CM | POA: Diagnosis present

## 2014-10-15 DIAGNOSIS — R7989 Other specified abnormal findings of blood chemistry: Secondary | ICD-10-CM

## 2014-10-15 DIAGNOSIS — I959 Hypotension, unspecified: Secondary | ICD-10-CM | POA: Diagnosis present

## 2014-10-15 DIAGNOSIS — I4891 Unspecified atrial fibrillation: Secondary | ICD-10-CM | POA: Diagnosis present

## 2014-10-15 DIAGNOSIS — I248 Other forms of acute ischemic heart disease: Secondary | ICD-10-CM | POA: Diagnosis present

## 2014-10-15 DIAGNOSIS — K219 Gastro-esophageal reflux disease without esophagitis: Secondary | ICD-10-CM | POA: Diagnosis present

## 2014-10-15 DIAGNOSIS — D649 Anemia, unspecified: Secondary | ICD-10-CM | POA: Diagnosis present

## 2014-10-15 DIAGNOSIS — R748 Abnormal levels of other serum enzymes: Secondary | ICD-10-CM | POA: Diagnosis present

## 2014-10-15 DIAGNOSIS — Z66 Do not resuscitate: Secondary | ICD-10-CM | POA: Diagnosis present

## 2014-10-15 DIAGNOSIS — Z96642 Presence of left artificial hip joint: Secondary | ICD-10-CM | POA: Diagnosis present

## 2014-10-15 DIAGNOSIS — M79673 Pain in unspecified foot: Secondary | ICD-10-CM

## 2014-10-15 DIAGNOSIS — D72829 Elevated white blood cell count, unspecified: Secondary | ICD-10-CM | POA: Diagnosis present

## 2014-10-15 DIAGNOSIS — D62 Acute posthemorrhagic anemia: Secondary | ICD-10-CM | POA: Diagnosis present

## 2014-10-15 DIAGNOSIS — E785 Hyperlipidemia, unspecified: Secondary | ICD-10-CM | POA: Diagnosis present

## 2014-10-15 DIAGNOSIS — F419 Anxiety disorder, unspecified: Secondary | ICD-10-CM | POA: Diagnosis present

## 2014-10-15 HISTORY — DX: Essential (primary) hypertension: I10

## 2014-10-15 HISTORY — DX: Unspecified atrial fibrillation: I48.91

## 2014-10-15 HISTORY — DX: Hyperlipidemia, unspecified: E78.5

## 2014-10-15 HISTORY — DX: Gastro-esophageal reflux disease without esophagitis: K21.9

## 2014-10-15 LAB — CBC
HEMATOCRIT: 28 % — AB (ref 35.0–47.0)
Hemoglobin: 9.5 g/dL — ABNORMAL LOW (ref 12.0–16.0)
MCH: 35.3 pg — ABNORMAL HIGH (ref 26.0–34.0)
MCHC: 34 g/dL (ref 32.0–36.0)
MCV: 103.7 fL — ABNORMAL HIGH (ref 80.0–100.0)
Platelets: 157 10*3/uL (ref 150–440)
RBC: 2.7 MIL/uL — ABNORMAL LOW (ref 3.80–5.20)
RDW: 18 % — AB (ref 11.5–14.5)
WBC: 20 10*3/uL — AB (ref 3.6–11.0)

## 2014-10-15 LAB — BASIC METABOLIC PANEL
Anion gap: 7 (ref 5–15)
BUN: 23 mg/dL — ABNORMAL HIGH (ref 6–20)
CO2: 27 mmol/L (ref 22–32)
CREATININE: 0.65 mg/dL (ref 0.44–1.00)
Calcium: 8.7 mg/dL — ABNORMAL LOW (ref 8.9–10.3)
Chloride: 101 mmol/L (ref 101–111)
GFR calc Af Amer: >60 mL/min (ref >60)
GFR calc non Af Amer: >60 mL/min (ref >60)
GLUCOSE: 123 mg/dL — AB (ref 65–99)
Potassium: 4 mmol/L (ref 3.5–5.1)
Sodium: 135 mmol/L (ref 135–145)

## 2014-10-15 LAB — HEMOGLOBIN: Hemoglobin: 10.6 g/dL — ABNORMAL LOW (ref 12.0–16.0)

## 2014-10-15 LAB — PREPARE RBC (CROSSMATCH)

## 2014-10-15 LAB — ABO/RH: ABO/RH(D): A NEG

## 2014-10-15 LAB — TROPONIN I: Troponin I: 0.05 ng/mL — ABNORMAL HIGH (ref <0.031)

## 2014-10-15 MED ORDER — ENSURE ENLIVE PO LIQD: 237.0000 mL | Freq: Two times a day (BID) | ORAL | Status: DC

## 2014-10-15 MED ORDER — SODIUM CHLORIDE 0.9 % IV SOLN
1000.0000 mL | Freq: Once | INTRAVENOUS | Status: AC
  Administered 2014-10-15: 1000 mL via INTRAVENOUS

## 2014-10-15 MED ORDER — DILTIAZEM HCL 25 MG/5ML IV SOLN
10.0000 mg | Freq: Once | INTRAVENOUS | Status: AC
  Administered 2014-10-15: 10 mg via INTRAVENOUS

## 2014-10-15 MED ORDER — DILTIAZEM HCL 30 MG PO TABS
30.0000 mg | ORAL_TABLET | Freq: Once | ORAL | Status: AC
  Administered 2014-10-15: 30 mg via ORAL

## 2014-10-15 MED ORDER — SODIUM CHLORIDE 0.9 % IV SOLN
Freq: Once | INTRAVENOUS | Status: AC
  Administered 2014-10-15: 13:00:00 via INTRAVENOUS

## 2014-10-15 MED ORDER — DILTIAZEM HCL 25 MG/5ML IV SOLN: 10.0000 mg | Freq: Once | INTRAVENOUS | Status: AC

## 2014-10-15 MED ORDER — DILTIAZEM HCL 25 MG/5ML IV SOLN
10.0000 mg | Freq: Once | INTRAVENOUS | Status: AC
Start: 2014-10-15 — End: 2014-10-16
  Administered 2014-10-15: 10 mg via INTRAVENOUS

## 2014-10-15 MED ORDER — LACTULOSE 10 GM/15ML PO SOLN: 20.0000 g | Freq: Two times a day (BID) | ORAL | Status: DC | PRN

## 2014-10-15 MED ORDER — DILTIAZEM HCL 25 MG/5ML IV SOLN
INTRAVENOUS | Status: AC
  Filled 2014-10-15: qty 5

## 2014-10-15 MED ORDER — DILTIAZEM HCL 30 MG PO TABS
ORAL_TABLET | ORAL | Status: AC
  Administered 2014-10-15: 30 mg via ORAL
  Filled 2014-10-15: qty 1

## 2014-10-15 MED ORDER — DILTIAZEM HCL 25 MG/5ML IV SOLN
INTRAVENOUS | Status: AC
  Administered 2014-10-15: 10 mg via INTRAVENOUS
  Filled 2014-10-15: qty 5

## 2014-10-15 NOTE — Progress Notes (Signed)
Physical Therapy Treatment Patient Details Name: Vickie Howard MRN: [id number] DOB: [birth year] Today's Date: 10/15/2014    History of Present Illness Pt is a 79yo white female who presents on POD1 s/p L THA (posterolateral). 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     PT Comments    Pt with flat/sad affect. Pt progressing toward goals. Remember 2 of 3 posterior hip precautions. Encouraged continued use of incentive spirometer. Pt requires assist for personal hygiene post toileting. Pt is expected to discharge to SNF today.   Follow Up Recommendations  SNF     Equipment Recommendations  Rolling walker with 5" wheels    Recommendations for Other Services       Precautions / Restrictions Precautions Precautions: Posterior Hip Restrictions Weight Bearing Restrictions: Yes LLE Weight Bearing: Weight bearing as tolerated    Mobility  Bed Mobility Overal bed mobility: Needs Assistance Bed Mobility: Sit to Supine     Supine to sit: Min assist     General bed mobility comments:  (Scoots edge of bed with modified independence)  Transfers Overall transfer level: Needs assistance Equipment used: Rolling walker (2 wheeled) Transfers: Sit to/from Stand Sit to Stand: Min guard         General transfer comment: . STS from bed and bedside commode  Ambulation/Gait Ambulation/Gait assistance: Min guard Ambulation Distance (Feet): 5 Feet (later 8 ft from bedside commode to chair) Assistive device: Rolling walker (2 wheeled) Gait Pattern/deviations: Step-through pattern;Decreased weight shift to left   Gait velocity interpretation: <1.8 ft/sec, indicative of risk for recurrent falls General Gait Details: Didnt wish to ambulate further due to nauseated feeling   Stairs            Wheelchair Mobility    Modified Rankin (Stroke Patients Only)       Balance                                     Cognition Arousal/Alertness: Awake/alert Behavior During Therapy: WFL for tasks assessed/performed Overall Cognitive Status: Within Functional Limits for tasks assessed       Memory: Decreased recall of precautions              Exercises Total Joint Exercises Ankle Circles/Pumps: AROM;Both;20 reps;Supine Quad Sets: Strengthening;Both;20 reps;Supine Gluteal Sets: Strengthening;Both;20 reps;Supine Towel Squeeze: Strengthening;Both;20 reps;Supine Short Arc Quad: AAROM;Both;20 reps;Supine Heel Slides: AAROM;20 reps;Supine;Both Hip ABduction/ADduction: AAROM;Both;20 reps;Supine Straight Leg Raises: AAROM;Both;10 reps;Supine Other Exercises Other Exercises: Supine straight leg ER, B 20x    General Comments        Pertinent Vitals/Pain Pain Assessment: 0-10 Pain Score: 8  Pain Location: L hip  Pain Descriptors / Indicators: Aching Pain Intervention(s): Limited activity within patient's tolerance;Premedicated before session    Home Living                      Prior Function            PT Goals (current goals can now be found in the care plan section) Progress towards PT goals: Progressing toward goals    Frequency  BID    PT Plan Current plan remains appropriate    Co-evaluation             End of Session Equipment Utilized During Treatment: Gait belt Activity Tolerance: Patient limited by pain (Limited  by nausea) Patient left: in chair;with call bell/phone within reach;with chair alarm set     Time: 4171-2787 PT Time Calculation (min) (ACUTE ONLY): 25 min  Charges:  $Gait Training: 8-22 mins $Therapeutic Exercise: 8-22 mins                    G Codes:      Vickie Howard 10/15/2014, 11:14 AM

## 2014-10-15 NOTE — ED Notes (Signed)
MD at bedside. 

## 2014-10-15 NOTE — Progress Notes (Signed)
Transport staff here, patient discharge via stretcher,maintain hip protocol during transfer from bed to stretcher, RX. slip in packet. family at the bedside at time of discharge. Family to take patient's belonging with them.

## 2014-10-15 NOTE — ED Notes (Signed)
Pt given another dose of diltizem hr went from 150s to 130s will continue to monitor

## 2014-10-15 NOTE — Progress Notes (Signed)
Patient is medically stable for D/C to [facility] today. Per Kim admissions coordinator at [facility] patient is going to room 220-B. RN will call report at [phone] and arrange EMS for transport once patient has a BM and receives blood. Clinical Education officer, museum (CSW) prepared D/C packet and sent D/C Summary and follow up appointment to [employer] via [employer]. Patient and patient's son Coralyn Mark are aware of above. CSW faxed updated PT note to Wells Guiles with [facility]. Per Wells Guiles patient still meets criteria for SNF. [facility] Medicare authorization has been received. Please reconsult if future social work needs arise. CSW signing off.   Blima Rich, [place] [phone]

## 2014-10-15 NOTE — ED Provider Notes (Signed)
[facility] Emergency Department Provider Note   ____________________________________________  Time seen: On EMS arrival 10:15 PM  I have reviewed the triage vital signs and the triage nursing note.   HISTORY  Chief Complaint No chief complaint on file.    HPI Vickie Howard is a 79 y.o. female who was checking into nursing home after being released from the hospital from a hip surgery and she was found to have a rapid atrial fibrillation pulse. She is asymptomatic with no shortness of breath, no chest pain, no trouble breathing, no palpitations, no dizziness, no weakness, no altered mental status. She does have a history of A. fib. She states her diltiazem was recently changed from 30 mg 3 times a day to a extended release 100 mg daily.      Past Medical History  Diagnosis Date  . Dysrhythmia     a fib  . Heart murmur   . History of hiatal hernia   . Arthritis   . Occasional tremors     hands    Patient Active Problem List   Diagnosis Date Noted  . S/P total hip arthroplasty 10/12/2014    Past Surgical History  Procedure Laterality Date  . Tubal ligation    . Joint replacement      right total hip 2015  . Left total hip arthroplasty  10/12/2014  . Total hip arthroplasty Left 10/12/2014    Procedure: TOTAL HIP ARTHROPLASTY;  Surgeon: Dereck Leep, MD;  Location: [facility] ORS;  Service: Orthopedics;  Laterality: Left;    Current Outpatient Rx  Name  Route  Sig  Dispense  Refill  . acetaminophen (TYLENOL) 500 MG tablet   Oral   Take 1 tablet (500 mg total) by mouth every 6 (six) hours as needed for mild pain or fever.   30 tablet   0   . atorvastatin (LIPITOR) 20 MG tablet   Oral   Take 20 mg by mouth daily.         . busPIRone (BUSPAR) 15 MG tablet   Oral   Take 15 mg by mouth 2 (two) times daily. 0.5 to 1 bid prn         . diltiazem (CARDIZEM) 120 MG tablet   Oral   Take 120 mg by mouth every morning.         . diltiazem  (CARDIZEM) 30 MG tablet   Oral   Take 30 mg by mouth every morning.          . enoxaparin (LOVENOX) 30 MG/0.3ML injection   Subcutaneous   Inject 0.3 mLs (30 mg total) into the skin every 12 (twelve) hours.   28 Syringe   0   . feeding supplement, ENSURE ENLIVE, (ENSURE ENLIVE) LIQD   Oral   Take 237 mLs by mouth 2 (two) times daily between meals.   237 mL   12   . Ferrous Sulfate (IRON) 142 (45 FE) MG TBCR   Oral   Take 45 mg of iron by mouth 1 day or 1 dose.         . gabapentin (NEURONTIN) 100 MG capsule   Oral   Take 100 mg by mouth 1 day or 1 dose.         . lactulose (CHRONULAC) 10 GM/15ML solution   Oral   Take 30 mLs (20 g total) by mouth 2 (two) times daily as needed for severe constipation (not relieved by MOM or dulcolax).   [place]  mL   0   . meclizine (ANTIVERT) 12.5 MG tablet   Oral   Take 12.5 mg by mouth 3 (three) times daily as needed for dizziness.         . Multiple Vitamins-Minerals (MULTIVITAMIN WITH MINERALS) tablet   Oral   Take 1 tablet by mouth daily. areds2         . omeprazole (PRILOSEC) 20 MG capsule   Oral   Take 20 mg by mouth daily.         Marland Kitchen oxyCODONE (OXY IR/ROXICODONE) 5 MG immediate release tablet   Oral   Take 1-2 tablets (5-10 mg total) by mouth every 4 (four) hours as needed for breakthrough pain.   30 tablet   0   . sertraline (ZOLOFT) 50 MG tablet   Oral   Take 50 mg by mouth daily.         . traMADol (ULTRAM) 50 MG tablet   Oral   Take 50 mg by mouth 6 (six) times daily. As needed           Allergies Codeine sulfate and Levaquin  No family history on file.  Social History History  Substance Use Topics  . Smoking status: Never Smoker   . Smokeless tobacco: Not on file  . Alcohol Use: Not on file    Review of Systems  Constitutional: Negative for fever. Eyes: Negative for visual changes. ENT: Negative for sore throat. Cardiovascular: Negative for chest pain. Respiratory: Negative for  shortness of breath. Gastrointestinal: Negative for abdominal pain, vomiting and diarrhea. Genitourinary: Negative for dysuria. Musculoskeletal: Negative for back pain. Recent left hip surgery but no pain now Skin: Negative for rash. Neurological: Negative for headaches, focal weakness or numbness.   ____________________________________________   PHYSICAL EXAM:  VITAL SIGNS: ED Triage Vitals  Enc Vitals Group     BP --      Pulse --      Resp --      Temp --      Temp src --      SpO2 --      Weight --      Height --      Head Cir --      Peak Flow --      Pain Score --      Pain Loc --      Pain Edu? --      Excl. in [place]? --      Constitutional: Alert and oriented. Well appearing and in no distress. Eyes: Conjunctivae are normal. PERRL. Normal extraocular movements. ENT   Head: Normocephalic and atraumatic.   Nose: No congestion/rhinnorhea.   Mouth/Throat: Mucous membranes are moist.   Neck: No stridor. Cardiovascular: Irregularly irregular and tachycardic. Systolic murmur. Respiratory: Normal respiratory effort without tachypnea nor retractions. Breath sounds are clear and equal bilaterally. No wheezes/rales/rhonchi. Gastrointestinal: Soft and nontender. No distention.  Genitourinary: Musculoskeletal: Minimal lower extremity edema bilaterally with compression hose in place. Neurologic:  Normal speech and language. No gross focal neurologic deficits are appreciated. Speech is normal. No gait instability. Skin:  Skin is warm, dry and intact. No rash noted. Psychiatric: Mood and affect are normal. Speech and behavior are normal. Patient exhibits appropriate insight and judgment.  ____________________________________________   EKG   171 bpm. Atrial fibrillation with rapid ventricular response. Left bundle branch block. Nonspecific T wave.  ____________________________________________   LABS (pertinent positives/negatives)  White blood count  20.0 Hemoglobin 9.5 Metabolic panel and troponin pending  ____________________________________________  RADIOLOGY Radiologist results reviewed  Chest x-ray portable: Negative  ____________________________________________   PROCEDURES  Procedure(s) performed: None Critical Care performed: None  ____________________________________________   INITIAL IMPRESSION / ASSESSMENT AND PLAN / ED COURSE  Pertinent labs & imaging results that were available during my care of the patient were reviewed by me and considered in my medical decision making (see chart for details).  Patient arrived in A. fib RVR with a good blood pressure and she was treated with IV diltiazem 10 mg bolus. Heart rate improved from 180s into the 140s, patient started blood pressure and a second 10 mg bolus was given. Patient really does not be readmitted to the hospital as this will cause her to give up her nursing home bed. 30 milligrams by mouth diltiazem was given. Patient care transferred to Dr. Beather Arbour at shift change.  ____________________________________________   FINAL CLINICAL IMPRESSION(S) / ED DIAGNOSES  Chronic A. fib with acute episode of rapid ventricular response     Lisa Roca, MD 10/15/14 2348

## 2014-10-15 NOTE — Progress Notes (Signed)
Report called to jennifer cole,RN.  Waiting for transport staff.

## 2014-10-15 NOTE — Clinical Social Work Placement (Signed)
   CLINICAL SOCIAL WORK PLACEMENT  NOTE  Date:  10/15/2014  Patient Details  Name: Vickie Howard MRN: [id number] Date of Birth: May 02, 1936  Clinical Social Work is seeking post-discharge placement for this patient at the [place] level of care (*CSW will initial, date and re-position this form in  chart as items are completed):  Yes   Patient/family provided with [place] Work Department's list of facilities offering this level of care within the geographic area requested by the patient (or if unable, by the patient's family).  Yes   Patient/family informed of their freedom to choose among providers that offer the needed level of care, that participate in Medicare, Medicaid or managed care program needed by the patient, have an available bed and are willing to accept the patient.  Yes   Patient/family informed of [facility]'s ownership interest in [facility] and [facility], as well as of the fact that they are under no obligation to receive care at these facilities.  PASRR submitted to EDS on 10/13/14     PASRR number received on 10/13/14     Existing PASRR number confirmed on       FL2 transmitted to all facilities in geographic area requested by pt/family on 10/13/14     FL2 transmitted to all facilities within larger geographic area on       Patient informed that his/her managed care company has contracts with or will negotiate with certain facilities, including the following:        Yes   Patient/family informed of bed offers received.  Patient chooses bed at  [facility] )     Physician recommends and patient chooses bed at      Patient to be transferred to  [facility] ) on 10/15/14.  Patient to be transferred to facility by  (EMS )     Patient family notified on 10/15/14 of transfer.  Name of family member notified:   Coralyn Mark son )     PHYSICIAN       Additional Comment:     _______________________________________________ Loralyn Freshwater, LCSW 10/15/2014, 2:00 PM

## 2014-10-15 NOTE — Progress Notes (Signed)
Pt is alert and oriented. VSS. Pain improved with PO pain medication on MAR. Denies nausea, tolerating diet. IVF infusing. Voiding without difficulty. Needs post op BM, suppository given. Planning to discharge to [facility] today. Sleeping between care, will continue to monitor.

## 2014-10-15 NOTE — Progress Notes (Signed)
Vance Peper, PA here to round, inform of patient's Hgb 7.4 on 10/14/14. Vance Peper, PA to place order for 1units PRBC today.

## 2014-10-15 NOTE — Discharge Instructions (Signed)
Return to the emergency department for any new or worsening condition including chest pain, shortness of breath, dizziness, altered mental status, weakness, numbness or any other symptoms concerning to you.  Atrial Fibrillation Atrial fibrillation is a type of irregular heart rhythm (arrhythmia). During atrial fibrillation, the upper chambers of the heart (atria) quiver continuously in a chaotic pattern. This causes an irregular and often rapid heart rate.  Atrial fibrillation is the result of the heart becoming overloaded with disorganized signals that tell it to beat. These signals are normally released one at a time by a part of the right atrium called the sinoatrial node. They then travel from the atria to the lower chambers of the heart (ventricles), causing the atria and ventricles to contract and pump blood as they pass. In atrial fibrillation, parts of the atria outside of the sinoatrial node also release these signals. This results in two problems. First, the atria receive so many signals that they do not have time to fully contract. Second, the ventricles, which can only receive one signal at a time, beat irregularly and out of rhythm with the atria.  There are three types of atrial fibrillation:   Paroxysmal. Paroxysmal atrial fibrillation starts suddenly and stops on its own within a week.  Persistent. Persistent atrial fibrillation lasts for more than a week. It may stop on its own or with treatment.  Permanent. Permanent atrial fibrillation does not go away. Episodes of atrial fibrillation may lead to permanent atrial fibrillation. Atrial fibrillation can prevent your heart from pumping blood normally. It increases your risk of stroke and can lead to heart failure.  CAUSES   Heart conditions, including a heart attack, heart failure, coronary artery disease, and heart valve conditions.   Inflammation of the sac that surrounds the heart (pericarditis).  Blockage of an artery in the  lungs (pulmonary embolism).  Pneumonia or other infections.  Chronic lung disease.  Thyroid problems, especially if the thyroid is overactive (hyperthyroidism).  Caffeine, excessive alcohol use, and use of some illegal drugs.   Use of some medicines, including certain decongestants and diet pills.  Heart surgery.   Birth defects.  Sometimes, no cause can be found. When this happens, the atrial fibrillation is called lone atrial fibrillation. The risk of complications from atrial fibrillation increases if you have lone atrial fibrillation and you are age 81 years or older. RISK FACTORS  Heart failure.  Coronary artery disease.  Diabetes mellitus.   High blood pressure (hypertension).   Obesity.   Other arrhythmias.   Increased age. SIGNS AND SYMPTOMS   A feeling that your heart is beating rapidly or irregularly.   A feeling of discomfort or pain in your chest.   Shortness of breath.   Sudden light-headedness or weakness.   Getting tired easily when exercising.   Urinating more often than normal (mainly when atrial fibrillation first begins).  In paroxysmal atrial fibrillation, symptoms may start and suddenly stop. DIAGNOSIS  Your health care provider may be able to detect atrial fibrillation when taking your pulse. Your health care provider may have you take a test called an ambulatory electrocardiogram (ECG). An ECG records your heartbeat patterns over a 24-hour period. You may also have other tests, such as:  Transthoracic echocardiogram (TTE). During echocardiography, sound waves are used to evaluate how blood flows through your heart.  Transesophageal echocardiogram (TEE).  Stress test. There is more than one type of stress test. If a stress test is needed, ask your health care provider about  which type is best for you.  Chest X-ray exam.  Blood tests.  Computed tomography (CT). TREATMENT  Treatment may include:  Treating any underlying  conditions. For example, if you have an overactive thyroid, treating the condition may correct atrial fibrillation.  Taking medicine. Medicines may be given to control a rapid heart rate or to prevent blood clots, heart failure, or a stroke.  Having a procedure to correct the rhythm of the heart:  Electrical cardioversion. During electrical cardioversion, a controlled, low-energy shock is delivered to the heart through your skin. If you have chest pain, very low blood pressure, or sudden heart failure, this procedure may need to be done as an emergency.  Catheter ablation. During this procedure, heart tissues that send the signals that cause atrial fibrillation are destroyed.  Surgical ablation. During this surgery, thin lines of heart tissue that carry the abnormal signals are destroyed. This procedure can either be an open-heart surgery or a minimally invasive surgery. With the minimally invasive surgery, small cuts are made to access the heart instead of a large opening.  Pulmonary venous isolation. During this surgery, tissue around the veins that carry blood from the lungs (pulmonary veins) is destroyed. This tissue is thought to carry the abnormal signals. HOME CARE INSTRUCTIONS   Take medicines only as directed by your health care provider. Some medicines can make atrial fibrillation worse or recur.  If blood thinners were prescribed by your health care provider, take them exactly as directed. Too much blood-thinning medicine can cause bleeding. If you take too little, you will not have the needed protection against stroke and other problems.  Perform blood tests at home if directed by your health care provider. Perform blood tests exactly as directed.  Quit smoking if you smoke.  Do not drink alcohol.  Do not drink caffeinated beverages such as coffee, soda, and some teas. You may drink decaffeinated coffee, soda, or tea.   Maintain a healthy weight.Do not use diet pills unless  your health care provider approves. They may make heart problems worse.   Follow diet instructions as directed by your health care provider.  Exercise regularly as directed by your health care provider.  Keep all follow-up visits as directed by your health care provider. This is important. PREVENTION  The following substances can cause atrial fibrillation to recur:   Caffeinated beverages.  Alcohol.  Certain medicines, especially those used for breathing problems.  Certain herbs and herbal medicines, such as those containing ephedra or ginseng.  Illegal drugs, such as cocaine and amphetamines. Sometimes medicines are given to prevent atrial fibrillation from recurring. Proper treatment of any underlying condition is also important in helping prevent recurrence.  SEEK MEDICAL CARE IF:  You notice a change in the rate, rhythm, or strength of your heartbeat.  You suddenly begin urinating more frequently.  You tire more easily when exerting yourself or exercising. SEEK IMMEDIATE MEDICAL CARE IF:   You have chest pain, abdominal pain, sweating, or weakness.  You feel nauseous.  You have shortness of breath.  You suddenly have swollen feet and ankles.  You feel dizzy.  Your face or limbs feel numb or weak.  You have a change in your vision or speech. MAKE SURE YOU:   Understand these instructions.  Will watch your condition.  Will get help right away if you are not doing well or get worse. Document Released: 05/22/2005 Document Revised: 10/06/2013 Document Reviewed: 07/02/2012 [facility] Patient Information 2015 [place], [place]. This information is not  intended to replace advice given to you by your health care provider. Make sure you discuss any questions you have with your health care provider. ° °

## 2014-10-16 ENCOUNTER — Encounter: Payer: Self-pay | Admitting: Internal Medicine

## 2014-10-16 DIAGNOSIS — I10 Essential (primary) hypertension: Secondary | ICD-10-CM | POA: Diagnosis present

## 2014-10-16 DIAGNOSIS — F419 Anxiety disorder, unspecified: Secondary | ICD-10-CM | POA: Diagnosis present

## 2014-10-16 DIAGNOSIS — I480 Paroxysmal atrial fibrillation: Secondary | ICD-10-CM | POA: Diagnosis present

## 2014-10-16 DIAGNOSIS — R748 Abnormal levels of other serum enzymes: Secondary | ICD-10-CM | POA: Diagnosis present

## 2014-10-16 DIAGNOSIS — K219 Gastro-esophageal reflux disease without esophagitis: Secondary | ICD-10-CM | POA: Diagnosis present

## 2014-10-16 DIAGNOSIS — Z66 Do not resuscitate: Secondary | ICD-10-CM | POA: Diagnosis present

## 2014-10-16 DIAGNOSIS — R778 Other specified abnormalities of plasma proteins: Secondary | ICD-10-CM | POA: Diagnosis present

## 2014-10-16 DIAGNOSIS — D62 Acute posthemorrhagic anemia: Secondary | ICD-10-CM | POA: Diagnosis present

## 2014-10-16 DIAGNOSIS — R7989 Other specified abnormal findings of blood chemistry: Secondary | ICD-10-CM

## 2014-10-16 DIAGNOSIS — I248 Other forms of acute ischemic heart disease: Secondary | ICD-10-CM | POA: Diagnosis present

## 2014-10-16 DIAGNOSIS — I4891 Unspecified atrial fibrillation: Secondary | ICD-10-CM | POA: Diagnosis present

## 2014-10-16 DIAGNOSIS — E785 Hyperlipidemia, unspecified: Secondary | ICD-10-CM | POA: Diagnosis present

## 2014-10-16 DIAGNOSIS — D649 Anemia, unspecified: Secondary | ICD-10-CM | POA: Diagnosis present

## 2014-10-16 DIAGNOSIS — Z96642 Presence of left artificial hip joint: Secondary | ICD-10-CM | POA: Diagnosis present

## 2014-10-16 DIAGNOSIS — D72829 Elevated white blood cell count, unspecified: Secondary | ICD-10-CM | POA: Diagnosis present

## 2014-10-16 DIAGNOSIS — I959 Hypotension, unspecified: Secondary | ICD-10-CM | POA: Diagnosis present

## 2014-10-16 DIAGNOSIS — Z79891 Long term (current) use of opiate analgesic: Secondary | ICD-10-CM | POA: Diagnosis not present

## 2014-10-16 LAB — BASIC METABOLIC PANEL
ANION GAP: 7 (ref 5–15)
BUN: 21 mg/dL — ABNORMAL HIGH (ref 6–20)
CALCIUM: 8.4 mg/dL — AB (ref 8.9–10.3)
CHLORIDE: 100 mmol/L — AB (ref 101–111)
CO2: 27 mmol/L (ref 22–32)
Creatinine, Ser: 0.65 mg/dL (ref 0.44–1.00)
GFR calc non Af Amer: >60 mL/min (ref >60)
GLUCOSE: 104 mg/dL — AB (ref 65–99)
POTASSIUM: 3.7 mmol/L (ref 3.5–5.1)
Sodium: 134 mmol/L — ABNORMAL LOW (ref 135–145)

## 2014-10-16 LAB — TYPE AND SCREEN
ABO/RH(D): A NEG
Antibody Screen: POSITIVE
Unit division: 0

## 2014-10-16 LAB — URINALYSIS COMPLETE WITH MICROSCOPIC (ARMC ONLY)
Bacteria, UA: NONE SEEN
Bilirubin Urine: NEGATIVE
Glucose, UA: NEGATIVE mg/dL
HGB URINE DIPSTICK: NEGATIVE
Leukocytes, UA: NEGATIVE
Nitrite: NEGATIVE
Protein, ur: 100 mg/dL — AB
Specific Gravity, Urine: 1.028 (ref 1.005–1.030)
pH: 5 (ref 5.0–8.0)

## 2014-10-16 LAB — CBC
HCT: 26.1 % — ABNORMAL LOW (ref 35.0–47.0)
HEMOGLOBIN: 8.6 g/dL — AB (ref 12.0–16.0)
MCH: 34.4 pg — AB (ref 26.0–34.0)
MCHC: 33 g/dL (ref 32.0–36.0)
MCV: 104 fL — ABNORMAL HIGH (ref 80.0–100.0)
PLATELETS: 147 10*3/uL — AB (ref 150–440)
RBC: 2.51 MIL/uL — AB (ref 3.80–5.20)
RDW: 17.6 % — AB (ref 11.5–14.5)
WBC: 16.6 10*3/uL — ABNORMAL HIGH (ref 3.6–11.0)

## 2014-10-16 LAB — GLUCOSE, CAPILLARY: Glucose-Capillary: 102 mg/dL — ABNORMAL HIGH (ref 65–99)

## 2014-10-16 LAB — TROPONIN I
Troponin I: 0.05 ng/mL — ABNORMAL HIGH (ref <0.031)
Troponin I: 0.03 ng/mL (ref <0.031)

## 2014-10-16 LAB — TSH: TSH: 2.917 u[IU]/mL (ref 0.350–4.500)

## 2014-10-16 MED ORDER — BUSPIRONE HCL 5 MG PO TABS: 15.0000 mg | ORAL_TABLET | Freq: Two times a day (BID) | ORAL | Status: DC | PRN

## 2014-10-16 MED ORDER — ACETAMINOPHEN 325 MG PO TABS
650.0000 mg | ORAL_TABLET | Freq: Four times a day (QID) | ORAL | Status: DC | PRN
  Administered 2014-10-16 – 2014-10-18 (×3): 650 mg via ORAL
  Filled 2014-10-16 (×3): qty 2

## 2014-10-16 MED ORDER — DILTIAZEM HCL 100 MG IV SOLR
5.0000 mg/h | INTRAVENOUS | Status: DC
  Administered 2014-10-16: 5 mg/h via INTRAVENOUS
  Filled 2014-10-16 (×2): qty 100

## 2014-10-16 MED ORDER — OXYCODONE HCL 5 MG PO TABS
5.0000 mg | ORAL_TABLET | ORAL | Status: DC | PRN
  Administered 2014-10-19: 5 mg via ORAL
  Filled 2014-10-16 (×2): qty 1

## 2014-10-16 MED ORDER — DILTIAZEM HCL 30 MG PO TABS
30.0000 mg | ORAL_TABLET | Freq: Four times a day (QID) | ORAL | Status: DC
  Administered 2014-10-16 (×2): 30 mg via ORAL
  Filled 2014-10-16 (×2): qty 1

## 2014-10-16 MED ORDER — BUSPIRONE HCL 5 MG PO TABS: 15.0000 mg | ORAL_TABLET | Freq: Two times a day (BID) | ORAL | Status: DC

## 2014-10-16 MED ORDER — ALPRAZOLAM 0.25 MG PO TABS
0.2500 mg | ORAL_TABLET | Freq: Three times a day (TID) | ORAL | Status: DC | PRN
  Administered 2014-10-16 – 2014-10-18 (×3): 0.25 mg via ORAL
  Filled 2014-10-16 (×3): qty 1

## 2014-10-16 MED ORDER — DIGOXIN 125 MCG PO TABS
0.1250 mg | ORAL_TABLET | Freq: Once | ORAL | Status: AC
  Administered 2014-10-16: 0.125 mg via ORAL
  Filled 2014-10-16: qty 1

## 2014-10-16 MED ORDER — DILTIAZEM HCL 60 MG PO TABS: 120.0000 mg | ORAL_TABLET | Freq: Every morning | ORAL | Status: DC

## 2014-10-16 MED ORDER — ACETAMINOPHEN 650 MG RE SUPP: 650.0000 mg | Freq: Four times a day (QID) | RECTAL | Status: DC | PRN

## 2014-10-16 MED ORDER — PANTOPRAZOLE SODIUM 40 MG PO TBEC
40.0000 mg | DELAYED_RELEASE_TABLET | Freq: Every day | ORAL | Status: DC
  Administered 2014-10-16 – 2014-10-20 (×5): 40 mg via ORAL
  Filled 2014-10-16 (×5): qty 1

## 2014-10-16 MED ORDER — SERTRALINE HCL 25 MG PO TABS
50.0000 mg | ORAL_TABLET | Freq: Every day | ORAL | Status: DC
  Administered 2014-10-16 – 2014-10-20 (×5): 50 mg via ORAL
  Filled 2014-10-16: qty 1
  Filled 2014-10-16 (×4): qty 2

## 2014-10-16 MED ORDER — ATORVASTATIN CALCIUM 20 MG PO TABS
20.0000 mg | ORAL_TABLET | Freq: Every day | ORAL | Status: DC
  Administered 2014-10-16 – 2014-10-20 (×5): 20 mg via ORAL
  Filled 2014-10-16 (×5): qty 1

## 2014-10-16 MED ORDER — DILTIAZEM HCL 60 MG PO TABS
60.0000 mg | ORAL_TABLET | Freq: Four times a day (QID) | ORAL | Status: DC
  Administered 2014-10-16 – 2014-10-20 (×17): 60 mg via ORAL
  Filled 2014-10-16 (×17): qty 1

## 2014-10-16 MED ORDER — GABAPENTIN 100 MG PO CAPS
100.0000 mg | ORAL_CAPSULE | Freq: Every day | ORAL | Status: DC
  Administered 2014-10-16 – 2014-10-20 (×5): 100 mg via ORAL
  Filled 2014-10-16 (×5): qty 1

## 2014-10-16 MED ORDER — TRAMADOL HCL 50 MG PO TABS
50.0000 mg | ORAL_TABLET | Freq: Four times a day (QID) | ORAL | Status: DC | PRN
  Administered 2014-10-17 – 2014-10-19 (×4): 50 mg via ORAL
  Filled 2014-10-16 (×4): qty 1

## 2014-10-16 MED ORDER — ENOXAPARIN SODIUM 30 MG/0.3ML ~~LOC~~ SOLN
30.0000 mg | Freq: Two times a day (BID) | SUBCUTANEOUS | Status: DC
  Administered 2014-10-16 – 2014-10-20 (×9): 30 mg via SUBCUTANEOUS
  Filled 2014-10-16 (×9): qty 0.3

## 2014-10-16 MED ORDER — SENNOSIDES-DOCUSATE SODIUM 8.6-50 MG PO TABS
1.0000 | ORAL_TABLET | Freq: Every evening | ORAL | Status: DC | PRN
Start: 2014-10-16 — End: 2014-10-20
  Administered 2014-10-18: 1 via ORAL
  Filled 2014-10-16: qty 1

## 2014-10-16 MED ORDER — ENSURE ENLIVE PO LIQD
237.0000 mL | Freq: Two times a day (BID) | ORAL | Status: DC
  Administered 2014-10-16 – 2014-10-20 (×9): 237 mL via ORAL

## 2014-10-16 NOTE — Progress Notes (Signed)
Patient admitted overnight with afib rvr. Recent admission for elective left hip replacement on 10/12/2014. Patient A&O on 2l O2 with clear lungs. VSS. HR 99 on cardizem drip@ 10 mg/hr. See charting for assessments.

## 2014-10-16 NOTE — ED Notes (Signed)
Admitting MD in to see patient. °

## 2014-10-16 NOTE — Progress Notes (Signed)
Patient ID: Vickie Howard, female   DOB: Aug 05, 1935, 79 y.o.   MRN: [id number]   Afib reasonably controlled on PO meds; will transfer to floor on telemetry

## 2014-10-16 NOTE — ED Notes (Signed)
MD at bedside. Discussing the need to admit back to hospital from afib

## 2014-10-16 NOTE — Plan of Care (Signed)
Problem: Consults Goal: Ventricular Arrhythmia Patient Education (See Patient Education module for education specifics.)  Outcome: Completed/Met Date Met:  10/16/14 Family and patient understands  Problem: Phase I Progression Outcomes Goal: Pain controlled with appropriate interventions Outcome: Completed/Met Date Met:  10/16/14 Po pain meds  Controlling pain Goal: Arrhythmia controlled or corrected Outcome: Progressing Started po Cardizem today

## 2014-10-16 NOTE — Consult Note (Signed)
Reason for Consult: atrial fibrillation rapid ventricular response Referring Physician:  Dr. Elmer Sow Vickie Howard is an 79 y.o. female.  HPI:  Sent a 73 all female status post hip surgery known history of paroxysmal atrial fibrillation with hypertension hyperlipidemia will complains of recurrent palpitations minimally but heart racing once discharge from her hip surgery. The patient denies any chest pains or shortness of breath she complains of feeling weak and what statin and will tired. Patient has significant soreness in her here but states to be doing reasonably well with reduced appetite. Patient denies any significant bleeding no fever and and is here after being discharged from hip surgery and had tachycardia.  Past Medical History  Diagnosis Date  . Heart murmur   . History of hiatal hernia   . Arthritis   . Occasional tremors     hands  . Atrial fibrillation   . HLD (hyperlipidemia)   . HTN (hypertension)   . GERD (gastroesophageal reflux disease)     Past Surgical History  Procedure Laterality Date  . Tubal ligation    . Joint replacement      right total hip 2015  . Left total hip arthroplasty  10/12/2014  . Total hip arthroplasty Left 10/12/2014    Procedure: TOTAL HIP ARTHROPLASTY;  Surgeon: Dereck Leep, MD;  Location: [facility] ORS;  Service: Orthopedics;  Laterality: Left;    Family History  Problem Relation Age of Onset  . Aneurysm Father   . Breast cancer Daughter     Social History:  reports that she has never smoked. She does not have any smokeless tobacco history on file. She reports that she does not drink alcohol or use illicit drugs.  Allergies:  Allergies  Allergen Reactions  . Codeine Sulfate     syncope  . Levaquin [Levofloxacin] Nausea And Vomiting    Medications:  Prior to Admission:  Prescriptions prior to admission  Medication Sig Dispense Refill Last Dose  . acetaminophen (TYLENOL) 500 MG tablet Take 1 tablet (500 mg total) by mouth every  6 (six) hours as needed for mild pain or fever. 30 tablet 0   . atorvastatin (LIPITOR) 20 MG tablet Take 20 mg by mouth daily.   10/11/2014 at 0800  . busPIRone (BUSPAR) 15 MG tablet Take 15 mg by mouth 2 (two) times daily. 0.5 to 1 bid prn   Past Week at Unknown time  . diltiazem (CARDIZEM) 120 MG tablet Take 120 mg by mouth every morning.   10/11/2014 at 0800  . enoxaparin (LOVENOX) 30 MG/0.3ML injection Inject 0.3 mLs (30 mg total) into the skin every 12 (twelve) hours. 28 Syringe 0   . feeding supplement, ENSURE ENLIVE, (ENSURE ENLIVE) LIQD Take 237 mLs by mouth 2 (two) times daily between meals. 237 mL 12   . Ferrous Sulfate (IRON) 142 (45 FE) MG TBCR Take 45 mg of iron by mouth 1 day or 1 dose.   10/11/2014 at a1900  . gabapentin (NEURONTIN) 100 MG capsule Take 100 mg by mouth 1 day or 1 dose.   10/11/2014 at 0800  . lactulose (CHRONULAC) 10 GM/15ML solution Take 30 mLs (20 g total) by mouth 2 (two) times daily as needed for severe constipation (not relieved by MOM or dulcolax). 240 mL 0   . meclizine (ANTIVERT) 12.5 MG tablet Take 12.5 mg by mouth 3 (three) times daily as needed for dizziness.   Past Month at 0800  . Multiple Vitamins-Minerals (MULTIVITAMIN WITH MINERALS) tablet Take 1 tablet  by mouth daily. areds2   Completed Course at Unknown time  . omeprazole (PRILOSEC) 20 MG capsule Take 20 mg by mouth daily.   10/12/2014 at 0500  . oxyCODONE (OXY IR/ROXICODONE) 5 MG immediate release tablet Take 1-2 tablets (5-10 mg total) by mouth every 4 (four) hours as needed for breakthrough pain. 30 tablet 0   . sertraline (ZOLOFT) 50 MG tablet Take 50 mg by mouth daily.   10/11/2014 at 0800  . traMADol (ULTRAM) 50 MG tablet Take 50 mg by mouth 6 (six) times daily. As needed   Not Taking at Unknown time    Results for orders placed or performed during the hospital encounter of 10/15/14 (from the past 48 hour(s))  Basic metabolic panel     Status: Abnormal   Collection Time: 10/15/14 10:11 PM  Result Value  Ref Range   Sodium 135 135 - 145 mmol/L   Potassium 4.0 3.5 - 5.1 mmol/L   Chloride 101 101 - 111 mmol/L   CO2 27 22 - 32 mmol/L   Glucose, Bld 123 (H) 65 - 99 mg/dL   BUN 23 (H) 6 - 20 mg/dL   Creatinine, Ser 0.65 0.44 - 1.00 mg/dL   Calcium 8.7 (L) 8.9 - 10.3 mg/dL   GFR calc non Af Amer >60 >60 mL/min   GFR calc Af Amer >60 >60 mL/min    Comment: (NOTE) The eGFR has been calculated using the CKD EPI equation. This calculation has not been validated in all clinical situations. eGFR's persistently <60 mL/min signify possible Chronic Kidney Disease.    Anion gap 7 5 - 15  CBC     Status: Abnormal   Collection Time: 10/15/14 10:23 PM  Result Value Ref Range   WBC 20.0 (H) 3.6 - 11.0 K/uL   RBC 2.70 (L) 3.80 - 5.20 MIL/uL   Hemoglobin 9.5 (L) 12.0 - 16.0 g/dL   HCT 28.0 (L) 35.0 - 47.0 %   MCV 103.7 (H) 80.0 - 100.0 fL   MCH 35.3 (H) 26.0 - 34.0 pg   MCHC 34.0 32.0 - 36.0 g/dL   RDW 18.0 (H) 11.5 - 14.5 %   Platelets 157 150 - 440 K/uL  Troponin I     Status: Abnormal   Collection Time: 10/15/14 10:23 PM  Result Value Ref Range   Troponin I 0.05 (H) <0.031 ng/mL    Comment: READ BACK AND VERIFIED WITH KIMREY BROWN AT 2352 10/15/14 SDR        PERSISTENTLY INCREASED TROPONIN VALUES IN THE RANGE OF 0.04-0.49 ng/mL CAN BE SEEN IN:       -UNSTABLE ANGINA       -CONGESTIVE HEART FAILURE       -MYOCARDITIS       -CHEST TRAUMA       -ARRYHTHMIAS       -LATE PRESENTING MYOCARDIAL INFARCTION       -COPD   CLINICAL FOLLOW-UP RECOMMENDED.   Urinalysis complete, with microscopic [facility])     Status: Abnormal   Collection Time: 10/16/14 12:04 AM  Result Value Ref Range   Color, Urine AMBER (A) YELLOW   APPearance CLEAR (A) CLEAR   Glucose, UA NEGATIVE NEGATIVE mg/dL   Bilirubin Urine NEGATIVE NEGATIVE   Ketones, ur TRACE (A) NEGATIVE mg/dL   Specific Gravity, Urine 1.028 1.005 - 1.030   Hgb urine dipstick NEGATIVE NEGATIVE   pH 5.0 5.0 - 8.0   Protein, ur 100 (A) NEGATIVE  mg/dL   Nitrite NEGATIVE NEGATIVE  Leukocytes, UA NEGATIVE NEGATIVE   RBC / HPF 0-5 0 - 5 RBC/hpf   WBC, UA 0-5 0 - 5 WBC/hpf   Bacteria, UA NONE SEEN NONE SEEN   Squamous Epithelial / LPF 0-5 (A) NONE SEEN   Mucous PRESENT   Glucose, capillary     Status: Abnormal   Collection Time: 10/16/14  2:32 AM  Result Value Ref Range   Glucose-Capillary 102 (H) 65 - 99 mg/dL  Basic metabolic panel     Status: Abnormal   Collection Time: 10/16/14  9:19 AM  Result Value Ref Range   Sodium 134 (L) 135 - 145 mmol/L   Potassium 3.7 3.5 - 5.1 mmol/L   Chloride 100 (L) 101 - 111 mmol/L   CO2 27 22 - 32 mmol/L   Glucose, Bld 104 (H) 65 - 99 mg/dL   BUN 21 (H) 6 - 20 mg/dL   Creatinine, Ser 0.65 0.44 - 1.00 mg/dL   Calcium 8.4 (L) 8.9 - 10.3 mg/dL   GFR calc non Af Amer >60 >60 mL/min   GFR calc Af Amer >60 >60 mL/min    Comment: (NOTE) The eGFR has been calculated using the CKD EPI equation. This calculation has not been validated in all clinical situations. eGFR's persistently <60 mL/min signify possible Chronic Kidney Disease.    Anion gap 7 5 - 15  CBC     Status: Abnormal   Collection Time: 10/16/14  9:19 AM  Result Value Ref Range   WBC 16.6 (H) 3.6 - 11.0 K/uL   RBC 2.51 (L) 3.80 - 5.20 MIL/uL   Hemoglobin 8.6 (L) 12.0 - 16.0 g/dL   HCT 26.1 (L) 35.0 - 47.0 %   MCV 104.0 (H) 80.0 - 100.0 fL   MCH 34.4 (H) 26.0 - 34.0 pg   MCHC 33.0 32.0 - 36.0 g/dL   RDW 17.6 (H) 11.5 - 14.5 %   Platelets 147 (L) 150 - 440 K/uL  Troponin I     Status: Abnormal   Collection Time: 10/16/14  9:19 AM  Result Value Ref Range   Troponin I 0.05 (H) <0.031 ng/mL    Comment: READ BACK AND VERIFIED WITH DANA DUGGINS AT 1018 ON 10/16/14.Marland KitchenMarland KitchenPoughkeepsie        PERSISTENTLY INCREASED TROPONIN VALUES IN THE RANGE OF 0.04-0.49 ng/mL CAN BE SEEN IN:       -UNSTABLE ANGINA       -CONGESTIVE HEART FAILURE       -MYOCARDITIS       -CHEST TRAUMA       -ARRYHTHMIAS       -LATE PRESENTING MYOCARDIAL INFARCTION        -COPD   CLINICAL FOLLOW-UP RECOMMENDED.   TSH     Status: None   Collection Time: 10/16/14  9:19 AM  Result Value Ref Range   TSH 2.917 0.350 - 4.500 uIU/mL    Dg Chest Port 1 View  10/15/2014   CLINICAL DATA:  Atrial fibrillation  EXAM: PORTABLE CHEST - 1 VIEW  COMPARISON:  05/01/2014  FINDINGS: Cardiomediastinal silhouette is stable. Elevation of the left hemidiaphragm again noted. No acute infiltrate or pulmonary edema.  IMPRESSION: No active disease.  No significant change.   Electronically Signed   By: Lahoma Crocker M.D.   On: 10/15/2014 22:52    Review of Systems  Constitutional: Positive for malaise/fatigue.  HENT: Negative.   Eyes: Negative.   Respiratory: Negative.   Cardiovascular: Positive for palpitations.  Gastrointestinal: Positive for heartburn.  Genitourinary: Negative.  Musculoskeletal: Positive for myalgias and joint pain.  Skin: Negative.   Neurological: Positive for weakness.  Endo/Heme/Allergies: Negative.   Psychiatric/Behavioral: Negative.    Blood pressure 108/75, pulse 94, temperature 98.5 F (36.9 C), temperature source Oral, resp. rate 22, height 5' (1.524 m), weight 48 kg (105 lb 13.1 oz), SpO2 96 %. Physical Exam  Constitutional: She is oriented to person, place, and time. She appears well-developed and well-nourished.  Cardiovascular: S1 normal, S2 normal and normal pulses.  An irregularly irregular rhythm present. Frequent extrasystoles are present. Tachycardia present.   Murmur heard.  Systolic murmur is present with a grade of 3/6  Respiratory: Effort normal and breath sounds normal.  GI: Soft. Bowel sounds are normal.  Musculoskeletal: Normal range of motion.  Neurological: She is alert and oriented to person, place, and time.  Skin: Skin is warm and dry.    Assessment/Plan:  atrial fibrillation paroxysmal  rapid ventricular response  the postop from hip surgery  relative hypotension  hyperlipidemia  GERD  borderline troponins  elevated  white count . PLAN  rate control with Cardizem  switch from IV to p.o. Cardizem 60 q.6 hours  digoxin p.r.n. Id for heart rate control  DVT prophylaxis  mild fluid hydration agree with broad-spectrum antibiotics for elevated white count  reflux prophylaxis with Protonix  pain control with tramadol as needed or oxycodone  do not recommend cardioversion  I do not recommend long-term anticoagulation because of bleeding risk  at this point I do not recommend permanent pacemaker placement  will continue to follow with you  case discussed with attending and patient and family  Dawit Tankard D. 10/16/2014, 4:02 PM

## 2014-10-16 NOTE — Clinical Social Work Note (Signed)
Patient was sent to [facility] for rehab yesterday and was returned to the ED upon arrival to [facility] due to afib. FL2 completed. Full assessment to follow. Patient has [facility] and will require reauth to return to [facility]. CSW has sent [facility] a message to determine if they can accept patient back at discharge.  [place], [place]

## 2014-10-16 NOTE — Consult Note (Signed)
Patient 4 days out from left THA by Dr Marry Guan.  Afib with RVR on arrival at [facility]. No problems with hip. Will follow and order PT, start when ok with medicine.

## 2014-10-16 NOTE — Progress Notes (Signed)
SUBJECTIVE:  Pt with h/o afib, b12 deficiency, HTN, s/p recent left hip replacement for DJD.  Was d/c'ed yesterday to SNF, noted to have afib with RVR and sent back to hospital.  Has done well on cardizem gtt; HR controlled.  Chart lists HR into 30's, but not confirmed on telemetry and appears to have been peripheral.  Has h/o afib with RVR noted as outpt; does not get symptoms and did not feel current episode.  Pt with worsening anemia after surgery, s/p transfusion. Noted to have WBC 20k on arrival but had received blood earlier yesterday, per family.  No fever, chills; reports pain adequately controlled.  ______________________________________________________________________  ROS: Please see HPI; remainder of complete 10 point ROS is negative   Past Medical History  Diagnosis Date  . Heart murmur   . History of hiatal hernia   . Arthritis   . Occasional tremors     hands  . Atrial fibrillation   . HLD (hyperlipidemia)   . HTN (hypertension)   . GERD (gastroesophageal reflux disease)     Past Surgical History  Procedure Laterality Date  . Tubal ligation    . Joint replacement      right total hip 2015  . Left total hip arthroplasty  10/12/2014  . Total hip arthroplasty Left 10/12/2014    Procedure: TOTAL HIP ARTHROPLASTY;  Surgeon: Dereck Leep, MD;  Location: [facility] ORS;  Service: Orthopedics;  Laterality: Left;     Current facility-administered medications:  .  acetaminophen (TYLENOL) tablet 650 mg, 650 mg, Oral, Q6H PRN **OR** acetaminophen (TYLENOL) suppository 650 mg, 650 mg, Rectal, Q6H PRN, Lance Coon, MD .  atorvastatin (LIPITOR) tablet 20 mg, 20 mg, Oral, Daily, Lance Coon, MD .  busPIRone (BUSPAR) tablet 15 mg, 15 mg, Oral, BID PRN, Lance Coon, MD .  digoxin [facility]) tablet 0.125 mg, 0.125 mg, Oral, Once, [name] .  diltiazem (CARDIZEM) 100 mg in dextrose 5 % 100 mL (1 mg/mL) infusion, 5-15 mg/hr, Intravenous, Continuous, Paulette Blanch, MD, Last Rate: 5  mL/hr at 10/16/14 0700, 5 mg/hr at 10/16/14 0700 .  enoxaparin (LOVENOX) injection 30 mg, 30 mg, Subcutaneous, Q12H, Lance Coon, MD, 30 mg at 10/16/14 0356 .  feeding supplement (ENSURE ENLIVE) (ENSURE ENLIVE) liquid 237 mL, 237 mL, Oral, BID BM, Lance Coon, MD .  gabapentin (NEURONTIN) capsule 100 mg, 100 mg, Oral, Daily, Lance Coon, MD .  oxyCODONE (Oxy IR/ROXICODONE) immediate release tablet 5 mg, 5 mg, Oral, Q4H PRN, Lance Coon, MD .  pantoprazole (PROTONIX) EC tablet 40 mg, 40 mg, Oral, Daily, Lance Coon, MD .  senna-docusate (Senokot-S) tablet 1 tablet, 1 tablet, Oral, QHS PRN, Lance Coon, MD .  sertraline (ZOLOFT) tablet 50 mg, 50 mg, Oral, Daily, Lance Coon, MD .  traMADol Veatrice Bourbon) tablet 50 mg, 50 mg, Oral, Q6H PRN, Lance Coon, MD  PHYSICAL EXAM:  BP 123/108 mmHg  Pulse 116  Temp(Src) 98.9 F (37.2 C) (Oral)  Resp 20  Ht 5' (1.524 m)  Wt 48 kg (105 lb 13.1 oz)  BMI 20.67 kg/m2  SpO2 95%  General: thin  female, in NAD HEENT: conjunctiva pale; OP moist without lesions. Neck: supple, trachea midline, no thyromegaly Chest: normal to palpation Lungs: clear bilaterally without retractions or wheezes Cardiovascular: irr irr with 2/6 SEM; distal pulses 1+ Abdomen: soft, nontender, nondistended, reduced bowel sounds Extremities: no clubbing, cyanosis, edema.  Post op changes left hip Neuro: alert and oriented, moves all extremities Derm: no significant rashes or  nodules; good skin turgor Lymph: no cervical or supraclavicular lymphadenopathy  Labs and imaging studies were reviewed  ASSESSMENT/PLAN:   1. afib with rapid rate- currently controlled; convert to PO cardizem and will give 1 dose digoxin.  Cardiology to see.  On lovenox at prophyllaxis doses.  Will further address anticoagulation based on progress 2. Acute blood loss anemia- following CBC; will check cultures, given leukocytosis, but this may be due to stress/transfusion.  3. Anxiety- xanax PRN 4.  Post op- as per ortho.

## 2014-10-16 NOTE — ED Provider Notes (Signed)
-----------------------------------------   1:01 AM on 10/16/2014 -----------------------------------------  Patient's heart rate remains in the 130s approximately one hour after oral Cardizem and as well as 2 doses of IV Cardizem. Discussed with patient and family; will initiate Cardizem continuous infusion and admit to the hospital.  Paulette Blanch, MD 10/16/14 [phone]

## 2014-10-16 NOTE — H&P (Addendum)
[facility] NAME: Vickie Howard    MR#:  [id number]  DATE OF BIRTH:  02-18-1936  DATE OF ADMISSION:  10/15/2014  PRIMARY CARE PHYSICIAN: [name]   REQUESTING/REFERRING PHYSICIAN: Beather Arbour  CHIEF COMPLAINT:   Chief Complaint  Patient presents with  . Atrial Fibrillation    HISTORY OF PRESENT ILLNESS:  Vickie Howard  is a 79 y.o. female who presents with atrial fibrillation with rapid ventricular response. Patient was just discharged from the hospital after an elective left hip replacement surgery.  When she arrived to the rehabilitation facility she was noted to have a heart rate in 180s and was sent back to the ED. In the ED she was noted to be in atrial fibrillation with rapid ventricular response. She was given multiple Cardizem boluses IV as well as some by mouth rate controlling medication, and her heart rate slowed but only to the 130s to 140s. So hospitalists were called at this point for admission for A. fib with RVR requiring management with Cardizem drip. Patient states that she is relatively asymptomatic, feeling some palpitations only occasionally but without chest pain, shortness of breath, or other symptoms. See full review of systems below. Of note, she did have an elevated white count to 20 on evaluation in the ED. She denies any recent infectious symptoms, except for some low-grade fevers which she had shortly prior to discharge after her surgery.  PAST MEDICAL HISTORY:   Past Medical History  Diagnosis Date  . Heart murmur   . History of hiatal hernia   . Arthritis   . Occasional tremors     hands  . Atrial fibrillation   . HLD (hyperlipidemia)   . HTN (hypertension)   . GERD (gastroesophageal reflux disease)     PAST SURGICAL HISTORY:   Past Surgical History  Procedure Laterality Date  . Tubal ligation    . Joint replacement      right total hip 2015  . Left total hip arthroplasty   10/12/2014  . Total hip arthroplasty Left 10/12/2014    Procedure: TOTAL HIP ARTHROPLASTY;  Surgeon: Dereck Leep, MD;  Location: [facility] ORS;  Service: Orthopedics;  Laterality: Left;    SOCIAL HISTORY:   History  Substance Use Topics  . Smoking status: Never Smoker   . Smokeless tobacco: Not on file  . Alcohol Use: No    FAMILY HISTORY:   Family History  Problem Relation Age of Onset  . Aneurysm Father   . Breast cancer Daughter     DRUG ALLERGIES:   Allergies  Allergen Reactions  . Codeine Sulfate     syncope  . Levaquin [Levofloxacin] Nausea And Vomiting    MEDICATIONS AT HOME:   Prior to Admission medications   Medication Sig Start Date End Date Taking? Authorizing Provider  acetaminophen (TYLENOL) 500 MG tablet Take 1 tablet (500 mg total) by mouth every 6 (six) hours as needed for mild pain or fever. 10/13/14   Duanne Guess, PA-C  atorvastatin (LIPITOR) 20 MG tablet Take 20 mg by mouth daily.    Historical Provider, MD  busPIRone (BUSPAR) 15 MG tablet Take 15 mg by mouth 2 (two) times daily. 0.5 to 1 bid prn    Historical Provider, MD  diltiazem (CARDIZEM) 120 MG tablet Take 120 mg by mouth every morning.    Historical Provider, MD  enoxaparin (LOVENOX) 30 MG/0.3ML injection Inject 0.3 mLs (30 mg total) into the  skin every 12 (twelve) hours. 10/13/14   Duanne Guess, PA-C  feeding supplement, ENSURE ENLIVE, (ENSURE ENLIVE) LIQD Take 237 mLs by mouth 2 (two) times daily between meals. 10/15/14   Watt Climes, PA  Ferrous Sulfate (IRON) 142 (45 FE) MG TBCR Take 45 mg of iron by mouth 1 day or 1 dose.    Historical Provider, MD  gabapentin (NEURONTIN) 100 MG capsule Take 100 mg by mouth 1 day or 1 dose.    Historical Provider, MD  lactulose (CHRONULAC) 10 GM/15ML solution Take 30 mLs (20 g total) by mouth 2 (two) times daily as needed for severe constipation (not relieved by MOM or dulcolax). 10/15/14   Watt Climes, PA  meclizine (ANTIVERT) 12.5 MG tablet Take 12.5 mg by  mouth 3 (three) times daily as needed for dizziness.    Historical Provider, MD  Multiple Vitamins-Minerals (MULTIVITAMIN WITH MINERALS) tablet Take 1 tablet by mouth daily. [id number]    Historical Provider, MD  omeprazole (PRILOSEC) 20 MG capsule Take 20 mg by mouth daily.    Historical Provider, MD  oxyCODONE (OXY IR/ROXICODONE) 5 MG immediate release tablet Take 1-2 tablets (5-10 mg total) by mouth every 4 (four) hours as needed for breakthrough pain. 10/13/14   Duanne Guess, PA-C  sertraline (ZOLOFT) 50 MG tablet Take 50 mg by mouth daily.    Historical Provider, MD  traMADol (ULTRAM) 50 MG tablet Take 50 mg by mouth 6 (six) times daily. As needed    Historical Provider, MD    REVIEW OF SYSTEMS:  Review of Systems  Constitutional: Positive for fever ( intermitten, low-grade). Negative for chills, weight loss and malaise/fatigue.  HENT: Negative for ear pain, hearing loss and tinnitus.   Eyes: Negative for blurred vision, double vision, pain and redness.  Respiratory: Negative for cough, hemoptysis and shortness of breath.   Cardiovascular: Positive for palpitations (intermittent). Negative for chest pain, orthopnea and leg swelling.  Gastrointestinal: Negative for nausea, vomiting, abdominal pain, diarrhea and constipation.  Genitourinary: Negative for dysuria, frequency and hematuria.  Musculoskeletal: Negative for myalgias, joint pain and falls.  Skin:       No acne, rash, or lesions  Neurological: Negative for dizziness, tremors, focal weakness and weakness.  Endo/Heme/Allergies: Negative for polydipsia. Does not bruise/bleed easily.  Psychiatric/Behavioral: Negative for depression. The patient is not nervous/anxious and does not have insomnia.      VITAL SIGNS:   Filed Vitals:   10/16/14 0050 10/16/14 0100 10/16/14 0120 10/16/14 0130  BP: 110/59 118/98 110/82 114/65  Pulse: 56 33 25 72  Temp:      TempSrc:      Resp: 17 21 16 18   Height:      Weight:      SpO2: 92% 89% 93%  93%   Wt Readings from Last 3 Encounters:  10/15/14 47.174 kg (104 lb)  09/29/14 47.628 kg (105 lb)    PHYSICAL EXAMINATION:  Physical Exam  Constitutional: She is oriented to person, place, and time. She appears well-developed and well-nourished. No distress.  HENT:  Head: Normocephalic and atraumatic.  Mouth/Throat: Oropharynx is clear and moist.  Eyes: Conjunctivae and EOM are normal. Pupils are equal, round, and reactive to light. No scleral icterus.  Neck: Normal range of motion. Neck supple. No JVD present. No thyromegaly present.  Cardiovascular: Intact distal pulses.  Exam reveals no gallop and no friction rub.   Murmur (systolic) heard. Tachycardic with irregular rhythm  Respiratory: Effort normal and breath sounds normal.  No respiratory distress. She has no wheezes. She has no rales.  GI: Soft. Bowel sounds are normal. She exhibits no distension. There is no tenderness.  Musculoskeletal: Normal range of motion. She exhibits no edema.  No arthritis, no gout  Lymphadenopathy:    She has no cervical adenopathy.  Neurological: She is alert and oriented to person, place, and time. No cranial nerve deficit.  No dysarthria, no aphasia  Skin: Skin is warm and dry. No rash noted. No erythema.  Psychiatric: She has a normal mood and affect. Her behavior is normal. Judgment and thought content normal.    LABORATORY PANEL:   CBC  Recent Labs Lab 10/15/14 2223  WBC 20.0*  HGB 9.5*  HCT 28.0*  PLT 157   ------------------------------------------------------------------------------------------------------------------  Chemistries   Recent Labs Lab 10/15/14 2211  NA 135  K 4.0  CL 101  CO2 27  GLUCOSE 123*  BUN 23*  CREATININE 0.65  CALCIUM 8.7*   ------------------------------------------------------------------------------------------------------------------  Cardiac Enzymes  Recent Labs Lab 10/15/14 2223  TROPONINI 0.05*    ------------------------------------------------------------------------------------------------------------------  RADIOLOGY:  Dg Chest Port 1 View  10/15/2014   CLINICAL DATA:  Atrial fibrillation  EXAM: PORTABLE CHEST - 1 VIEW  COMPARISON:  05/01/2014  FINDINGS: Cardiomediastinal silhouette is stable. Elevation of the left hemidiaphragm again noted. No acute infiltrate or pulmonary edema.  IMPRESSION: No active disease.  No significant change.   Electronically Signed   By: Lahoma Crocker M.D.   On: 10/15/2014 22:52    EKG:  No orders found for this or any previous visit.  IMPRESSION AND PLAN:  Principal Problem:   Atrial fibrillation with rapid ventricular response - patient responded some with Cardizem boluses, her heart rate dropped from the 180s to the 140s. We will start her on a Cardizem drip and admitted her to step down in the CCU area. Cardiology consult to Dr. Clayborn Bigness group. Active Problems:   Elevated troponin - likely secondary to her A. fib with RVR. We will trend her enzymes and monitor. She is asymptomatic.   GERD without esophagitis - chronic stable problem continue home PPI.   HTN (hypertension) - chronic stable problem continue home meds.   HLD (hyperlipidemia) - chronic stable problem continue home meds.     All the records are reviewed and case discussed with ED provider. Management plans discussed with the patient and/or family.  DVT PROPHYLAXIS: SubQ lovenox  ADMISSION STATUS: Observation  CODE STATUS: DO NOT RESUSCITATE Advance Directive Documentation        Most Recent Value   Type of Advance Directive  Healthcare Power of Attorney   Pre-existing out of facility DNR order (yellow form or pink MOST form)     "MOST" Form in Place?        TOTAL TIME TAKING CARE OF THIS PATIENT: 45 minutes.    Javelle Donigan [place] 10/16/2014, 2:04 AM  [facility]  Office  [phone]  CC: Primary care physician; [name]

## 2014-10-17 NOTE — Progress Notes (Signed)
SUBJECTIVE:  Pt with h/o afib, b12 deficiency, HTN, s/p recent left hip replacement for DJD.  Was d/c'ed to SNF but readmitted with A-fib and RVR . This am feels better. H.rate in the 90-100 - In A-fib Hgb; 8.6, WCC 16.6  ______________________________________________________________________  ROS: Please see HPI; remainder of complete 10 point ROS is negative   Past Medical History  Diagnosis Date  . Heart murmur   . History of hiatal hernia   . Arthritis   . Occasional tremors     hands  . Atrial fibrillation   . HLD (hyperlipidemia)   . HTN (hypertension)   . GERD (gastroesophageal reflux disease)     Past Surgical History  Procedure Laterality Date  . Tubal ligation    . Joint replacement      right total hip 2015  . Left total hip arthroplasty  10/12/2014  . Total hip arthroplasty Left 10/12/2014    Procedure: TOTAL HIP ARTHROPLASTY;  Surgeon: Dereck Leep, MD;  Location: [facility] ORS;  Service: Orthopedics;  Laterality: Left;     Current facility-administered medications:  .  acetaminophen (TYLENOL) tablet 650 mg, 650 mg, Oral, Q6H PRN, 650 mg at 10/16/14 1747 **OR** acetaminophen (TYLENOL) suppository 650 mg, 650 mg, Rectal, Q6H PRN, Lance Coon, MD .  ALPRAZolam Duanne Moron) tablet 0.25 mg, 0.25 mg, Oral, TID PRN, [name], MD, 0.25 mg at 10/16/14 1324 .  atorvastatin (LIPITOR) tablet 20 mg, 20 mg, Oral, Daily, Lance Coon, MD, 20 mg at 10/17/14 1006 .  busPIRone (BUSPAR) tablet 15 mg, 15 mg, Oral, BID PRN, Lance Coon, MD .  diltiazem (CARDIZEM) tablet 60 mg, 60 mg, Oral, 4 times per day, Yolonda Kida, MD, 60 mg at 10/17/14 0538 .  enoxaparin (LOVENOX) injection 30 mg, 30 mg, Subcutaneous, Q12H, Lance Coon, MD, 30 mg at 10/17/14 0539 .  feeding supplement (ENSURE ENLIVE) (ENSURE ENLIVE) liquid 237 mL, 237 mL, Oral, BID BM, Lance Coon, MD, 237 mL at 10/17/14 1009 .  gabapentin (NEURONTIN) capsule 100 mg, 100 mg, Oral, Daily, Lance Coon, MD, 100 mg at  10/17/14 1006 .  oxyCODONE (Oxy IR/ROXICODONE) immediate release tablet 5 mg, 5 mg, Oral, Q4H PRN, Lance Coon, MD .  pantoprazole (PROTONIX) EC tablet 40 mg, 40 mg, Oral, Daily, Lance Coon, MD, 40 mg at 10/17/14 1006 .  senna-docusate (Senokot-S) tablet 1 tablet, 1 tablet, Oral, QHS PRN, Lance Coon, MD .  sertraline (ZOLOFT) tablet 50 mg, 50 mg, Oral, Daily, Lance Coon, MD, 50 mg at 10/17/14 1006 .  traMADol (ULTRAM) tablet 50 mg, 50 mg, Oral, Q6H PRN, Lance Coon, MD, 50 mg at 10/17/14 1015  PHYSICAL EXAM:  BP 123/51 mmHg  Pulse 101  Temp(Src) 98.2 F (36.8 C) (Oral)  Resp 20  Ht 5' (1.524 m)  Wt 52.753 kg (116 lb 4.8 oz)  BMI 22.71 kg/m2  SpO2 93%  General: thin  female, in NAD HEENT: conjunctiva pale; OP moist without lesions. Neck: supple, trachea midline, no thyromegaly Chest: normal to palpation Lungs: clear bilaterally without retractions or wheezes Cardiovascular: irr irr with 2/6 SEM; distal pulses 1+ Abdomen: soft, nontender, nondistended, reduced bowel sounds Extremities: no clubbing, cyanosis, edema.  Post op changes left hip Neuro: alert and oriented, moves all extremities Derm: no significant rashes or nodules; good skin turgor Lymph: no cervical or supraclavicular lymphadenopathy  Labs and imaging studies were reviewed  ASSESSMENT/PLAN:   1. Afib with rapid rate- currently controlled; On  PO cardizem and Lovenox at prophyllaxis doses.  Seen by Cardiology; Long term anticoagulation- not recommended because of bleeding risk 2. Acute blood loss anemia- following CBC; Blood and urine cultures -negative, given leukocytosis, but this may be due to stress/transfusion.  3. Anxiety- xanax PRN , Buspar and Zoloft 4. Post op- as per ortho.-Continue pain management  5 Physical Therapy

## 2014-10-17 NOTE — Evaluation (Signed)
Physical Therapy Evaluation Patient Details Name: Vickie Howard MRN: [id number] DOB: 1935/12/13 Today's Date: 10/17/2014   History of Present Illness  Pt is a 79yo white female who is s/p L THA (posterolateral) on 10/12/14. She was discharged from hospital on 10/16/14 and sent to [facility]; Patient was then brought back to hospital with A-fib and HR in 180's;  Pt reports history of R THA back in November 2015 and elected to undergo procedure after history of DJD in L hip.   Clinical Impression  80 yo Female recently had LLE THA on 10/12/14. She received Acute care rehab and was discharged to [facility] place. Upon admission to [facility] patient was noted to have increased heart rate of >180's and was brought back to ED. Patient was re-admitted with A-fib and placed on telemetry floor. Patient's HR has stabilized and was in the 90's during entire PT evaluation. Patient is min A for bed mobility and transfers and gait tasks.She was able to walk 30 feet with RW demonstrating slow gait speed. She would benefit from additional skilled PT intervention to improve LE strength, balance/gait safety and functional mobility. Recommend patient go back to [facility] upon discharge for additional STR.    Follow Up Recommendations SNF    Equipment Recommendations       Recommendations for Other Services       Precautions / Restrictions Precautions Precautions: Posterior Hip Precaution Booklet Issued: Yes (comment) Precaution Comments: Pillows to support B hips in neutral Restrictions Weight Bearing Restrictions: Yes LLE Weight Bearing: Weight bearing as tolerated      Mobility  Bed Mobility Overal bed mobility: Needs Assistance Bed Mobility: Sit to Supine     Supine to sit: Min assist;HOB elevated (with bed rails, min A for scooting but able to get legs off bed; )        Transfers Overall transfer level: Needs assistance Equipment used: Rolling walker (2 wheeled) Transfers: Sit to/from  Stand Sit to Stand: Min assist;Mod assist         General transfer comment: initial transfer was mod A from bed, however then patient performed sit<>Stand transfer from bedside commode x2 reps with min A; transferred to bedside chair min A; needs cues for hand placement; (9 min transfer training) Also needed cues to advance LLE forward to decrease discomfort;   Ambulation/Gait Ambulation/Gait assistance: Min assist Ambulation Distance (Feet): 30 Feet Assistive device: Rolling walker (2 wheeled) Gait Pattern/deviations: Step-through pattern;Decreased step length - right;Decreased step length - left;Decreased stance time - left;Decreased stride length;Decreased dorsiflexion - right;Decreased dorsiflexion - left;Decreased weight shift to left;Antalgic;Narrow base of support     General Gait Details: Patient ambulates slowly with reciprocal gait pattern, excessivly flexed posture with narrow base of support; antalgic gait left with short stance time and uneven cadence;   Stairs            Wheelchair Mobility    Modified Rankin (Stroke Patients Only)       Balance Overall balance assessment: Needs assistance             Standing balance comment: Pt requires 2 HHA with standing, unable to maintain balance against mild pertubations; demonstrates decreased LLE weight shift with RLE lean;                              Pertinent Vitals/Pain Pain Assessment: 0-10 Pain Score: 8  Pain Location: L hip; patient only with activity; no pain at rest;  Pain Descriptors / Indicators: Aching;Sharp Pain Intervention(s): Limited activity within patient's tolerance;Premedicated before session;Repositioned    Home Living Family/patient expects to be discharged to:: Skilled nursing facility Living Arrangements: Alone                    Prior Function Level of Independence: Independent with assistive device(s)               Hand Dominance         Extremity/Trunk Assessment   Upper Extremity Assessment: Overall WFL for tasks assessed           Lower Extremity Assessment: Generalized weakness;LLE deficits/detail   LLE Deficits / Details: Generally weak (especially in ER) and inhibited by pain; LLE knee/ankle grossly 4/5     Communication   Communication: No difficulties  Cognition Arousal/Alertness: Awake/alert Behavior During Therapy: WFL for tasks assessed/performed Overall Cognitive Status: Within Functional Limits for tasks assessed       Memory: Decreased recall of precautions (able to remember 2/3 precautions; needed cues to remember to not to cross her legs.)              General Comments      Exercises Other Exercises Other Exercises: Patient able to verbalize and demonstrate independence in HEP including ankle pumps, hip abduction/adduction; instructed patient to avoid heel slides on LLE for hip flexion precautions; also reeducated patient in quad sets for strengthening. (3 min)      Assessment/Plan    PT Assessment Patient needs continued PT services  PT Diagnosis Difficulty walking;Abnormality of gait;Generalized weakness;Acute pain   PT Problem List Decreased strength;Decreased range of motion;Decreased activity tolerance;Decreased mobility;Pain  PT Treatment Interventions Therapeutic exercise;Therapeutic activities;Gait training;DME instruction;Functional mobility training;Balance training   PT Goals (Current goals can be found in the Care Plan section) Acute Rehab PT Goals Patient Stated Goal: DC to [facility] (has been there before)  PT Goal Formulation: With patient Time For Goal Achievement: 10/27/14 Potential to Achieve Goals: Good    Frequency 7X/week   Barriers to discharge   lives alone, limited social support    Co-evaluation               End of Session Equipment Utilized During Treatment: Gait belt (RW) Activity Tolerance: Patient limited by pain;Patient tolerated  treatment well Patient left: in chair;with call bell/phone within reach;with chair alarm set Nurse Communication: Mobility status         Time: 2263-3354 PT Time Calculation (min) (ACUTE ONLY): 25 min   Charges:   PT Evaluation $Initial PT Evaluation Tier I: 1 Procedure PT Treatments $Therapeutic Activity: 8-22 mins   PT G Codes:        Alessandra Grout, PT, DPT, [phone] 10/17/2014   12:35 PM

## 2014-10-17 NOTE — Progress Notes (Signed)
Subjective:  Patient feels reasonably well denies palpitations no tachycardia hip pain is manageable . Patient states to feel reasonably well. No fever chills or sweats. Denies any chest pain no bleeding denies any significant palpitations tolerating medications well.  Objective:  Vital Signs in the last 24 hours: Temp:  [98 F (36.7 C)-98.7 F (37.1 C)] 98.4 F (36.9 C) (05/14 1136) Pulse Rate:  [62-111] 65 (05/14 1136) Resp:  [16-20] 16 (05/14 1136) BP: (94-123)/(44-68) 120/57 mmHg (05/14 1136) SpO2:  [93 %-98 %] 96 % (05/14 1136) Weight:  [52.753 kg (116 lb 4.8 oz)-52.799 kg (116 lb 6.4 oz)] 52.753 kg (116 lb 4.8 oz) (05/14 0424)  Intake/Output from previous day: 05/13 0701 - 05/14 0700 In: -  Out: 550 [Urine:550] Intake/Output from this shift: Total I/O In: 360 [P.O.:360] Out: 650 [Urine:650]  Physical Exam: General appearance: alert and cooperative Neck: no adenopathy, no carotid bruit, no JVD, supple, symmetrical, trachea midline and thyroid not enlarged, symmetric, no tenderness/mass/nodules Lungs: clear to auscultation bilaterally and normal percussion bilaterally Heart: normal apical impulse and S1, S2 normal Abdomen: soft, non-tender; bowel sounds normal; no masses,  no organomegaly Extremities: extremities normal, atraumatic, no cyanosis or edema Pulses: 2+ and symmetric Skin: Skin color, texture, turgor normal. No rashes or lesions Neurologic: Grossly normal  Lab Results:  Recent Labs  10/15/14 2223 10/16/14 0919  WBC 20.0* 16.6*  HGB 9.5* 8.6*  PLT 157 147*    Recent Labs  10/15/14 2211 10/16/14 0919  NA 135 134*  K 4.0 3.7  CL 101 100*  CO2 27 27  GLUCOSE 123* 104*  BUN 23* 21*  CREATININE 0.65 0.65    Recent Labs  10/16/14 0919 10/16/14 1615  TROPONINI 0.05* 0.03   Hepatic Function Panel No results for input(s): PROT, ALBUMIN, AST, ALT, ALKPHOS, BILITOT, BILIDIR, IBILI in the last 72 hours. No results for input(s): CHOL in the last 72  hours. No results for input(s): PROTIME in the last 72 hours.  Imaging: Dg Chest Port 1 View  10/15/2014   CLINICAL DATA:  Atrial fibrillation  EXAM: PORTABLE CHEST - 1 VIEW  COMPARISON:  05/01/2014  FINDINGS: Cardiomediastinal silhouette is stable. Elevation of the left hemidiaphragm again noted. No acute infiltrate or pulmonary edema.  IMPRESSION: No active disease.  No significant change.   Electronically Signed   By: Lahoma Crocker M.D.   On: 10/15/2014 22:52    Cardiac Studies:  Assessment/Plan:  Arrhythmia Atrial Fibrillation Chest Pain Edema Shortness of Breath  GERD Hyperlipidemia Postop hip replacement . PLAN Atrial fibrillation paroxysmal now converted to sinus rhythm Continue diltiazem therapy to help with rate control Hyperlipidemia continue statin therapy. Lipitor DVT Prophylaxis as necessary GERD therapy with Protonix as necessary Pain control. Tramadol as necessary also oxycodone Xanax therapy to help with anxiety symptoms Recommended discharge home soon to rehabilitation Follow-up in the office within 3-4 weeks with cardiology   LOS: 1 day    CALLWOOD,DWAYNE D. 10/17/2014, 3:40 PM

## 2014-10-18 LAB — URINE CULTURE
CULTURE: NO GROWTH
Special Requests: NORMAL

## 2014-10-18 MED ORDER — SODIUM CHLORIDE 0.9 % IJ SOLN: 3.0000 mL | INTRAMUSCULAR | Status: DC | PRN

## 2014-10-18 NOTE — Progress Notes (Signed)
SUBJECTIVE:  Pt with h/o A fib, b12 deficiency, HTN, s/p recent left hip replacement for DJD.  Was d/c'ed to SNF but readmitted with A-fib and RVR . This am feels better. H.rate in the 66's - In A-fib   ______________________________________________________________________  ROS: Please see HPI; remainder of complete 10 point ROS is negative   Past Medical History  Diagnosis Date  . Heart murmur   . History of hiatal hernia   . Arthritis   . Occasional tremors     hands  . Atrial fibrillation   . HLD (hyperlipidemia)   . HTN (hypertension)   . GERD (gastroesophageal reflux disease)     Past Surgical History  Procedure Laterality Date  . Tubal ligation    . Joint replacement      right total hip 2015  . Left total hip arthroplasty  10/12/2014  . Total hip arthroplasty Left 10/12/2014    Procedure: TOTAL HIP ARTHROPLASTY;  Surgeon: Dereck Leep, MD;  Location: [facility] ORS;  Service: Orthopedics;  Laterality: Left;     Current facility-administered medications:  .  acetaminophen (TYLENOL) tablet 650 mg, 650 mg, Oral, Q6H PRN, 650 mg at 10/16/14 1747 **OR** acetaminophen (TYLENOL) suppository 650 mg, 650 mg, Rectal, Q6H PRN, Lance Coon, MD .  ALPRAZolam Duanne Moron) tablet 0.25 mg, 0.25 mg, Oral, TID PRN, [name], MD, 0.25 mg at 10/18/14 0100 .  atorvastatin (LIPITOR) tablet 20 mg, 20 mg, Oral, Daily, Lance Coon, MD, 20 mg at 10/18/14 1010 .  busPIRone (BUSPAR) tablet 15 mg, 15 mg, Oral, BID PRN, Lance Coon, MD .  diltiazem (CARDIZEM) tablet 60 mg, 60 mg, Oral, 4 times per day, Yolonda Kida, MD, 60 mg at 10/18/14 0550 .  enoxaparin (LOVENOX) injection 30 mg, 30 mg, Subcutaneous, Q12H, Lance Coon, MD, 30 mg at 10/18/14 0550 .  feeding supplement (ENSURE ENLIVE) (ENSURE ENLIVE) liquid 237 mL, 237 mL, Oral, BID BM, Lance Coon, MD, 237 mL at 10/18/14 1015 .  gabapentin (NEURONTIN) capsule 100 mg, 100 mg, Oral, Daily, Lance Coon, MD, 100 mg at 10/18/14 1010 .   oxyCODONE (Oxy IR/ROXICODONE) immediate release tablet 5 mg, 5 mg, Oral, Q4H PRN, Lance Coon, MD .  pantoprazole (PROTONIX) EC tablet 40 mg, 40 mg, Oral, Daily, Lance Coon, MD, 40 mg at 10/18/14 1010 .  senna-docusate (Senokot-S) tablet 1 tablet, 1 tablet, Oral, QHS PRN, Lance Coon, MD .  sertraline (ZOLOFT) tablet 50 mg, 50 mg, Oral, Daily, Lance Coon, MD, 50 mg at 10/18/14 1010 .  traMADol (ULTRAM) tablet 50 mg, 50 mg, Oral, Q6H PRN, Lance Coon, MD, 50 mg at 10/18/14 0100  PHYSICAL EXAM:  BP 102/62 mmHg  Pulse 91  Temp(Src) 98 F (36.7 C) (Oral)  Resp 18  Ht 5' (1.524 m)  Wt 53.116 kg (117 lb 1.6 oz)  BMI 22.87 kg/m2  SpO2 93%  General: thin  female, in NAD HEENT: conjunctiva pale; OP moist without lesions. Neck: supple, trachea midline, no thyromegaly Chest: normal to palpation Lungs: clear bilaterally without retractions or wheezes Cardiovascular: irr irr with 2/6 SEM; distal pulses 1+ Abdomen: soft, nontender, nondistended, reduced bowel sounds Extremities: no clubbing, cyanosis, edema.  Post op changes left hip Neuro: alert and oriented, moves all extremities Derm: no significant rashes or nodules; good skin turgor Lymph: no cervical or supraclavicular lymphadenopathy  Labs and imaging studies were reviewed  ASSESSMENT/PLAN:   1. Afib with rapid rate- currently controlled; On  PO cardizem and Lovenox at prophyllaxis doses.  Seen  by Cardiology; Long term anticoagulation- not recommended because of bleeding risk 2. Acute blood loss anemia- following CBC; Blood and urine cultures -negative, given leukocytosis, but this may be due to stress/transfusion.  3. Anxiety- xanax PRN , Buspar and Zoloft 4. Post op- as per ortho.-Continue pain management  5 Physical Therapy For Rehab in am

## 2014-10-18 NOTE — Progress Notes (Signed)
Dr. Ginette Pitman notified over phone of aerobic bottle blood culture positive for gram positive cocci in clusters, acknowledged by Dr. Ginette Pitman, no new orders received

## 2014-10-18 NOTE — Progress Notes (Signed)
Subjective:  Pt states no palpitation no sob no cp. She still has some pain from her hip.  Objective:  Vital Signs in the last 24 hours: Temp:  [98 F (36.7 C)-98.3 F (36.8 C)] 98.3 F (36.8 C) (05/15 1152) Pulse Rate:  [91-94] 92 (05/15 1152) Resp:  [18-21] 21 (05/15 1152) BP: (102-118)/(50-84) 118/84 mmHg (05/15 1152) SpO2:  [93 %-99 %] 99 % (05/15 1152) Weight:  [53.116 kg (117 lb 1.6 oz)] 53.116 kg (117 lb 1.6 oz) (05/15 0421)  Intake/Output from previous day: 05/14 0701 - 05/15 0700 In: 480 [P.O.:480] Out: 1275 [Urine:1275] Intake/Output from this shift: Total I/O In: 120 [P.O.:120] Out: 250 [Urine:250]  Physical Exam: General appearance: alert and cooperative Neck: no adenopathy, no carotid bruit, no JVD, supple, symmetrical, trachea midline and thyroid not enlarged, symmetric, no tenderness/mass/nodules Lungs: clear to auscultation bilaterally and normal percussion bilaterally Heart: normal apical impulse and irregularly irregular rhythm Abdomen: soft, non-tender; bowel sounds normal; no masses,  no organomegaly Extremities: extremities normal, atraumatic, no cyanosis or edema Pulses: 2+ and symmetric Skin: Skin color, texture, turgor normal. No rashes or lesions Neurologic: Alert and oriented X 3, normal strength and tone. Normal symmetric reflexes. Normal coordination and gait  Lab Results:  Recent Labs  10/15/14 2223 10/16/14 0919  WBC 20.0* 16.6*  HGB 9.5* 8.6*  PLT 157 147*    Recent Labs  10/15/14 2211 10/16/14 0919  NA 135 134*  K 4.0 3.7  CL 101 100*  CO2 27 27  GLUCOSE 123* 104*  BUN 23* 21*  CREATININE 0.65 0.65    Recent Labs  10/16/14 0919 10/16/14 1615  TROPONINI 0.05* 0.03   Hepatic Function Panel No results for input(s): PROT, ALBUMIN, AST, ALT, ALKPHOS, BILITOT, BILIDIR, IBILI in the last 72 hours. No results for input(s): CHOL in the last 72 hours. No results for input(s): PROTIME in the last 72 hours.  Imaging: No  results found.  Cardiac Studies:  Assessment/Plan: Post op Hip Tachycardia  Arrhythmia Atrial Fibrillation Chest Pain Edema Palpitations Shortness of Breath  . PLAN Stable Heart rate Continue Diltiazem for rate Agree with pain control for hip She should beready for transfer back to NH for rehab  LOS: 2 days    [place] D. 10/18/2014, 6:53 PM

## 2014-10-18 NOTE — Progress Notes (Signed)
Patient doing well Dressing dry Continue with PT.

## 2014-10-18 NOTE — Progress Notes (Signed)
Physical Therapy Treatment Patient Details Name: Vickie Howard MRN: [id number] DOB: [birth year] Today's Date: 10/18/2014    History of Present Illness Pt is a 79yo white female who is s/p L THA (posterolateral) on 10/12/14. She was discharged from hospital on 10/16/14 and sent to [facility]; Patient was then brought back to hospital with A-fib and HR in 180's;  Pt reports history of R THA back in November 2015 and elected to undergo procedure after history of DJD in L hip.     PT Comments    Pt shows good effort with activity and though she is slow and cautious with ambulation. She c/o R foot pain t/o the session (more than L hip) and is very weak with L hip exercises.   Follow Up Recommendations  SNF     Equipment Recommendations       Recommendations for Other Services       Precautions / Restrictions Precautions Precautions: Posterior Hip Restrictions Weight Bearing Restrictions: Yes LLE Weight Bearing: Weight bearing as tolerated    Mobility  Bed Mobility Overal bed mobility:  (pt in recliner, not tested)                Transfers Overall transfer level: Needs assistance Equipment used: Rolling walker (2 wheeled) Transfers: Sit to/from Stand Sit to Stand: Min guard            Ambulation/Gait Ambulation/Gait assistance: Min assist Ambulation Distance (Feet): 50 Feet Assistive device: Rolling walker (2 wheeled) Gait Pattern/deviations: Decreased step length - right;Decreased step length - left   Gait velocity interpretation: <1.8 ft/sec, indicative of risk for recurrent falls General Gait Details: Pt has no losses of balance or safety issues, but is very slow with shortened step length and very stooped posture.   Stairs            Wheelchair Mobility    Modified Rankin (Stroke Patients Only)       Balance                                    Cognition Arousal/Alertness: Awake/alert Behavior During Therapy: WFL for tasks  assessed/performed Overall Cognitive Status: Within Functional Limits for tasks assessed                      Exercises Total Joint Exercises Quad Sets: 10 reps;Strengthening Hip ABduction/ADduction: AROM;10 reps Long Arc Quad: 10 reps;AROM Knee Flexion: 10 reps;AROM    General Comments        Pertinent Vitals/Pain Pain Assessment: 0-10 Pain Score: 3  Pain Location:  (Pt reports having more pain on the top of the R foot)    Home Living                      Prior Function            PT Goals (current goals can now be found in the care plan section) Acute Rehab PT Goals Patient Stated Goal: DC to [facility] (has been there before)  PT Goal Formulation: With patient Time For Goal Achievement: 10/27/14 Potential to Achieve Goals: Good Progress towards PT goals: Progressing toward goals    Frequency  7X/week    PT Plan Current plan remains appropriate    Co-evaluation             End of Session Equipment Utilized During Treatment: Gait belt Activity Tolerance:  Patient tolerated treatment well Patient left: with chair alarm set     Time: 4383-8184 PT Time Calculation (min) (ACUTE ONLY): 28 min  Charges:  $Therapeutic Exercise: 8-22 mins $Therapeutic Activity: 8-22 mins                    G Codes:     Wayne Both, PT, DPT [phone]  Kreg Shropshire 10/18/2014, 5:04 PM

## 2014-10-19 LAB — BASIC METABOLIC PANEL
Anion gap: 8 (ref 5–15)
BUN: 15 mg/dL (ref 6–20)
CO2: 30 mmol/L (ref 22–32)
CREATININE: 0.69 mg/dL (ref 0.44–1.00)
Calcium: 8.6 mg/dL — ABNORMAL LOW (ref 8.9–10.3)
Chloride: 100 mmol/L — ABNORMAL LOW (ref 101–111)
GFR calc Af Amer: >60 mL/min (ref >60)
GFR calc non Af Amer: >60 mL/min (ref >60)
GLUCOSE: 90 mg/dL (ref 65–99)
Potassium: 3.4 mmol/L — ABNORMAL LOW (ref 3.5–5.1)
Sodium: 138 mmol/L (ref 135–145)

## 2014-10-19 LAB — CBC WITH DIFFERENTIAL/PLATELET
Basophils Absolute: 0 10*3/uL (ref 0–0.1)
Basophils Relative: 0 %
EOS PCT: 0 %
Eosinophils Absolute: 0 10*3/uL (ref 0–0.7)
HCT: 27.7 % — ABNORMAL LOW (ref 35.0–47.0)
Hemoglobin: 9.2 g/dL — ABNORMAL LOW (ref 12.0–16.0)
LYMPHS ABS: 1.3 10*3/uL (ref 1.0–3.6)
Lymphocytes Relative: 13 %
MCH: 34.7 pg — ABNORMAL HIGH (ref 26.0–34.0)
MCHC: 33.3 g/dL (ref 32.0–36.0)
MCV: 104.1 fL — AB (ref 80.0–100.0)
Monocytes Absolute: 3.5 10*3/uL — ABNORMAL HIGH (ref 0.2–0.9)
Monocytes Relative: 34 %
Neutro Abs: 5.5 10*3/uL (ref 1.4–6.5)
Neutrophils Relative %: 53 %
PLATELETS: 225 10*3/uL (ref 150–440)
RBC: 2.66 MIL/uL — ABNORMAL LOW (ref 3.80–5.20)
RDW: 16.2 % — AB (ref 11.5–14.5)
WBC: 10.3 10*3/uL (ref 3.6–11.0)

## 2014-10-19 MED ORDER — BISACODYL 10 MG RE SUPP
10.0000 mg | Freq: Once | RECTAL | Status: AC
  Administered 2014-10-19: 10 mg via RECTAL
  Filled 2014-10-19: qty 1

## 2014-10-19 MED ORDER — MAGNESIUM HYDROXIDE 400 MG/5ML PO SUSP
15.0000 mL | Freq: Once | ORAL | Status: AC
  Administered 2014-10-19: 15 mL via ORAL
  Filled 2014-10-19: qty 30

## 2014-10-19 NOTE — Progress Notes (Signed)
Pt alert and oriented. VSS. A fibb per monitor- rate controlled. Pt w complaints of left sided pain, relieved with PO main meds. No further complaints, continue to monitor.

## 2014-10-19 NOTE — Progress Notes (Signed)
Patient alert and oriented.  Left hip has honeycomb dressing on it.  Patient having some pain today.  Requests for Tramadol vs.  Oxycodone.  Up to chair with PT.  Family at bedside.  AFib on monitor, running 90-100

## 2014-10-19 NOTE — Progress Notes (Signed)
SUBJECTIVE:  Pt with h/o A fib, b12 deficiency, HTN, s/p recent left hip replacement for DJD.  Was d/c'ed to SNF but readmitted with A-fib and RVR .  HR and BP remain controlled.  Eating improved; reports no BM over the last 5 days or so.  No N/V reported.  Pain reasonable.  Developed right foot pain, worse with ambulation; denies injury or fall.  No redness.   ______________________________________________________________________  ROS: Please see HPI; remainder of complete 10 point ROS is negative   Past Medical History  Diagnosis Date  . Heart murmur   . History of hiatal hernia   . Arthritis   . Occasional tremors     hands  . Atrial fibrillation   . HLD (hyperlipidemia)   . HTN (hypertension)   . GERD (gastroesophageal reflux disease)     Past Surgical History  Procedure Laterality Date  . Tubal ligation    . Joint replacement      right total hip 2015  . Left total hip arthroplasty  10/12/2014  . Total hip arthroplasty Left 10/12/2014    Procedure: TOTAL HIP ARTHROPLASTY;  Surgeon: Dereck Leep, MD;  Location: [facility] ORS;  Service: Orthopedics;  Laterality: Left;     Current facility-administered medications:  .  acetaminophen (TYLENOL) tablet 650 mg, 650 mg, Oral, Q6H PRN, 650 mg at 10/18/14 1456 **OR** acetaminophen (TYLENOL) suppository 650 mg, 650 mg, Rectal, Q6H PRN, Lance Coon, MD .  ALPRAZolam Duanne Moron) tablet 0.25 mg, 0.25 mg, Oral, TID PRN, [name], MD, 0.25 mg at 10/18/14 0100 .  atorvastatin (LIPITOR) tablet 20 mg, 20 mg, Oral, Daily, Lance Coon, MD, 20 mg at 10/18/14 1010 .  busPIRone (BUSPAR) tablet 15 mg, 15 mg, Oral, BID PRN, Lance Coon, MD .  diltiazem (CARDIZEM) tablet 60 mg, 60 mg, Oral, 4 times per day, Yolonda Kida, MD, 60 mg at 10/19/14 0509 .  enoxaparin (LOVENOX) injection 30 mg, 30 mg, Subcutaneous, Q12H, Lance Coon, MD, 30 mg at 10/19/14 0510 .  feeding supplement (ENSURE ENLIVE) (ENSURE ENLIVE) liquid 237 mL, 237 mL, Oral, BID  BM, Lance Coon, MD, 237 mL at 10/18/14 1408 .  gabapentin (NEURONTIN) capsule 100 mg, 100 mg, Oral, Daily, Lance Coon, MD, 100 mg at 10/18/14 1010 .  oxyCODONE (Oxy IR/ROXICODONE) immediate release tablet 5 mg, 5 mg, Oral, Q4H PRN, Lance Coon, MD, 5 mg at 10/19/14 0518 .  pantoprazole (PROTONIX) EC tablet 40 mg, 40 mg, Oral, Daily, Lance Coon, MD, 40 mg at 10/18/14 1010 .  senna-docusate (Senokot-S) tablet 1 tablet, 1 tablet, Oral, QHS PRN, Lance Coon, MD, 1 tablet at 10/18/14 1231 .  sertraline (ZOLOFT) tablet 50 mg, 50 mg, Oral, Daily, Lance Coon, MD, 50 mg at 10/18/14 1010 .  sodium chloride 0.9 % injection 3 mL, 3 mL, Intravenous, PRN, [name], MD .  traMADol Veatrice Bourbon) tablet 50 mg, 50 mg, Oral, Q6H PRN, Lance Coon, MD, 50 mg at 10/18/14 0100  PHYSICAL EXAM:  BP 100/66 mmHg  Pulse 73  Temp(Src) 98.9 F (37.2 C) (Oral)  Resp 25  Ht 5' (1.524 m)  Wt 50.44 kg (111 lb 3.2 oz)  BMI 21.72 kg/m2  SpO2 97%  General: thin female, in NAD HEENT: conjunctiva pale; OP moist without lesions. Neck: supple, trachea midline, no thyromegaly Chest: normal to palpation Lungs: clear bilaterally without retractions or wheezes Cardiovascular: irr irr with 2/6 SEM; distal pulses 1+ Abdomen: soft, nontender, nondistended, reduced bowel sounds Extremities: no clubbing, cyanosis, edema.  Post op changes left hip.  Pain to palpation over dorsum of right foot, without redness or bruising Neuro: alert and oriented, moves all extremities Derm: no significant rashes or nodules; good skin turgor Lymph: no cervical or supraclavicular lymphadenopathy  Labs and imaging studies were reviewed  ASSESSMENT/PLAN:   1. Afib with rapid rate- remains controlled on short-acting cardizem. Given lability of HR and BP, may better to use this for now, with change to CD if HR and BP remain stable.  Remains on  Lovenox at prophyllaxis doses post op, but will need longterm anticoagulation when done with  this.  Had been using ASA due to high bleeding risk, and would plan to resume this when off lovenox 2. Acute blood loss anemia- stable; leukocytosis resolved.  1 blood cx positive, but appears to be contaminant.  3. Anxiety- xanax PRN  4. Post op- as per ortho; needs to have BM and will give additional meds this AM 5 d/c plan- appears stable for d/c to SNF after bowel movement  Jenesys Casseus Briscoe Burns III

## 2014-10-19 NOTE — Progress Notes (Signed)
Physical Therapy Treatment Patient Details Name: Vickie Howard MRN: [id number] DOB: January 03, 1936 Today's Date: 10/19/2014    History of Present Illness Pt is a 79yo white female who is s/p L THA (posterolateral) on 10/12/14. She was discharged from hospital on 10/16/14 and sent to [facility]; Patient was then brought back to hospital with A-fib and HR in 180's;  Pt reports history of R THA back in November 2015 and elected to undergo procedure after history of DJD in L hip.     PT Comments    R dorsum of foot noted to be mildly red and tender to the touch. Also elicits pain with movement. Discussed R foot pain with nursing; nurse in to assess at sessions end. Nurse notes she will have doctor assess. Pt requires set up assist for toileting and assist with hygiene in stand. Limited further ambulation today due to receiving suppository and pt having urge to use the commode once standing. Reviewed hip precautions with exercise and with stand to sit transfer.   Follow Up Recommendations  SNF     Equipment Recommendations       Recommendations for Other Services       Precautions / Restrictions Precautions Precautions: Posterior Hip Restrictions Weight Bearing Restrictions: Yes LLE Weight Bearing: Weight bearing as tolerated    Mobility  Bed Mobility Overal bed mobility: Modified Independent         Sit to supine: Supervision      Transfers Overall transfer level: Needs assistance Equipment used: Rolling walker (2 wheeled) Transfers: Sit to/from Stand Sit to Stand: Min guard            Ambulation/Gait Ambulation/Gait assistance: Min guard Ambulation Distance (Feet): 5 Feet (bed to bedside commode; then 8 ft to chair) Assistive device: Rolling walker (2 wheeled) Gait Pattern/deviations: Step-to pattern;Decreased step length - right;Decreased step length - left;Decreased stance time - left;Trunk flexed   Gait velocity interpretation: <1.8 ft/sec, indicative of risk  for recurrent falls     Stairs            Wheelchair Mobility    Modified Rankin (Stroke Patients Only)       Balance                                    Cognition Arousal/Alertness: Awake/alert Behavior During Therapy: WFL for tasks assessed/performed Overall Cognitive Status: Within Functional Limits for tasks assessed                      Exercises Total Joint Exercises Ankle Circles/Pumps: AROM;Both;20 reps;Seated (R limited to 10 due to pain complaints) Quad Sets: Strengthening;Both;10 reps (long sit) Gluteal Sets: Strengthening;Both;20 reps (long sit) Towel Squeeze: Strengthening;20 reps;Seated Long Arc Quad: AROM;Both;20 reps;Seated Other Exercises Other Exercises: STS and stand tolerance for personal hygiene post toileting x 2    General Comments        Pertinent Vitals/Pain Pain Assessment: 0-10 Pain Score: 6  Pain Location: L hip and top of R foot Pain Intervention(s): Limited activity within patient's tolerance;Monitored during session;Premedicated before session    Home Living                      Prior Function            PT Goals (current goals can now be found in the care plan section) Progress towards PT goals: Progressing toward goals  Frequency  7X/week    PT Plan Current plan remains appropriate    Co-evaluation             End of Session Equipment Utilized During Treatment: Gait belt Activity Tolerance: Patient limited by pain (Limited ambulation due to receiving suppository) Patient left: in chair;with call bell/phone within reach;with chair alarm set     Time: 0034-9179 PT Time Calculation (min) (ACUTE ONLY): 40 min  Charges:  $Gait Training: 8-22 mins $Therapeutic Exercise: 8-22 mins $Therapeutic Activity: 8-22 mins                    G Codes:      [name] 10/19/2014, 2:56 PM

## 2014-10-19 NOTE — Clinical Social Work Note (Signed)
Clinical Social Work Assessment  Patient Details  Name: Vickie Howard MRN: [id number] Date of Birth: [birth year]  Date of referral:  10/19/14               Reason for consult:  Facility Placement                Permission sought to share information with:  Facility Sport and exercise psychologist, Family Supports Permission granted to share information::  Yes, Verbal Permission Granted  Name::        Agency::     Relationship::     Contact Information:     Housing/Transportation Living arrangements for the past 2 months:  Single Family Home Source of Information:  Patient, Adult Children Patient Interpreter Needed:  None Criminal Activity/Legal Involvement Pertinent to Current Situation/Hospitalization:  No - Comment as needed Significant Relationships:  Adult Children Lives with:  Self Do you feel safe going back to the place where you live?  No Need for family participation in patient care:  Yes (Comment)  Care giving concerns:  Pt and family would like to get back into [facility] if possible.   Social Worker assessment / plan:  CSW spoke to pt and her family.  They are in agreement with DC to a SNF before pt returns home.  CSW is in the process of looking at SNF placements   Employment status:  Retired Forensic scientist:  Medicare PT Recommendations:  [place] / Referral to community resources:     Patient/Family's Response to care:  Pt and family in agreement with SNF placement   Patient/Family's Understanding of and Emotional Response to Diagnosis, Current Treatment, and Prognosis:  Pt and family are in agreement with SNF placement.  They thanked CSW for speaking with them.  Emotional Assessment Appearance:  Appears stated age Attitude/Demeanor/Rapport:   (pleasent and cooroperative) Affect (typically observed):  Accepting Orientation:  Oriented to Self, Oriented to Place, Oriented to  Time, Oriented to Situation Alcohol / Substance use:   Never Used Psych involvement (Current and /or in the community):  No (Comment)  Discharge Needs  Concerns to be addressed:  Discharge Planning Concerns, Other (Comment Required (CSW is currently looking into SNF placement.) Readmission within the last 30 days:  Yes Current discharge risk:  Lives alone Barriers to Discharge:      Mathews Argyle, LCSW 10/19/2014, 4:35 PM

## 2014-10-19 NOTE — Clinical Social Work Placement (Signed)
   CLINICAL SOCIAL WORK PLACEMENT  NOTE  Date:  10/19/2014  Patient Details  Name: Vickie Howard MRN: [id number] Date of Birth: 1935-07-18  Clinical Social Work is seeking post-discharge placement for this patient at the [place] level of care (*CSW will initial, date and re-position this form in  chart as items are completed):  Yes   Patient/family provided with [facility] Work Department's list of facilities offering this level of care within the geographic area requested by the patient (or if unable, by the patient's family).  Yes   Patient/family informed of their freedom to choose among providers that offer the needed level of care, that participate in Medicare, Medicaid or managed care program needed by the patient, have an available bed and are willing to accept the patient.  Yes   Patient/family informed of [facility]'s ownership interest in [facility] and [facility], as well as of the fact that they are under no obligation to receive care at these facilities.  PASRR submitted to EDS on       PASRR number received on       Existing PASRR number confirmed on 10/19/14     FL2 transmitted to all facilities in geographic area requested by pt/family on 10/19/14     FL2 transmitted to all facilities within larger geographic area on       Patient informed that his/her managed care company has contracts with or will negotiate with certain facilities, including the following:            Patient/family informed of bed offers received.  Patient chooses bed at       Physician recommends and patient chooses bed at      Patient to be transferred to   on  .  Patient to be transferred to facility by       Patient family notified on   of transfer.  Name of family member notified:        PHYSICIAN       Additional Comment:   CSW will f/u once offers have been received. _______________________________________________ Mathews Argyle, LCSW 10/19/2014, 4:39 PM

## 2014-10-20 ENCOUNTER — Inpatient Hospital Stay: Payer: Medicare Other

## 2014-10-20 LAB — URIC ACID: Uric Acid, Serum: 3.5 mg/dL (ref 2.3–6.6)

## 2014-10-20 MED ORDER — SENNOSIDES-DOCUSATE SODIUM 8.6-50 MG PO TABS: 1.0000 | ORAL_TABLET | Freq: Every evening | ORAL | Status: DC | PRN

## 2014-10-20 MED ORDER — DILTIAZEM HCL 60 MG PO TABS: 60.0000 mg | ORAL_TABLET | Freq: Four times a day (QID) | ORAL | Status: DC

## 2014-10-20 MED ORDER — OXYCODONE HCL 5 MG PO TABS: 5.0000 mg | ORAL_TABLET | ORAL | Status: DC | PRN

## 2014-10-20 MED ORDER — ALPRAZOLAM 0.25 MG PO TABS: 0.2500 mg | ORAL_TABLET | Freq: Three times a day (TID) | ORAL | Status: DC | PRN

## 2014-10-20 NOTE — Progress Notes (Signed)
SUBJECTIVE:  Pt with h/o A fib, b12 deficiency, HTN, s/p recent left hip replacement for DJD.  Was d/c'ed to SNF but readmitted with A-fib and RVR .  Pain better overnight.  Eating better and bowels moved yesterday, per pt.  HR, BP have been reasonably controlled   ______________________________________________________________________  ROS: Please see HPI; remainder of complete 10 point ROS is negative   Past Medical History  Diagnosis Date  . Heart murmur   . History of hiatal hernia   . Arthritis   . Occasional tremors     hands  . Atrial fibrillation   . HLD (hyperlipidemia)   . HTN (hypertension)   . GERD (gastroesophageal reflux disease)     Past Surgical History  Procedure Laterality Date  . Tubal ligation    . Joint replacement      right total hip 2015  . Left total hip arthroplasty  10/12/2014  . Total hip arthroplasty Left 10/12/2014    Procedure: TOTAL HIP ARTHROPLASTY;  Surgeon: Dereck Leep, MD;  Location: [facility] ORS;  Service: Orthopedics;  Laterality: Left;     Current facility-administered medications:  .  acetaminophen (TYLENOL) tablet 650 mg, 650 mg, Oral, Q6H PRN, 650 mg at 10/18/14 1456 **OR** acetaminophen (TYLENOL) suppository 650 mg, 650 mg, Rectal, Q6H PRN, Lance Coon, MD .  ALPRAZolam Duanne Moron) tablet 0.25 mg, 0.25 mg, Oral, TID PRN, [name], MD, 0.25 mg at 10/18/14 0100 .  atorvastatin (LIPITOR) tablet 20 mg, 20 mg, Oral, Daily, Lance Coon, MD, 20 mg at 10/19/14 1008 .  busPIRone (BUSPAR) tablet 15 mg, 15 mg, Oral, BID PRN, Lance Coon, MD .  diltiazem (CARDIZEM) tablet 60 mg, 60 mg, Oral, 4 times per day, Yolonda Kida, MD, 60 mg at 10/20/14 0507 .  enoxaparin (LOVENOX) injection 30 mg, 30 mg, Subcutaneous, Q12H, Lance Coon, MD, 30 mg at 10/20/14 0506 .  feeding supplement (ENSURE ENLIVE) (ENSURE ENLIVE) liquid 237 mL, 237 mL, Oral, BID BM, Lance Coon, MD, 237 mL at 10/19/14 1400 .  gabapentin (NEURONTIN) capsule 100 mg, 100 mg,  Oral, Daily, Lance Coon, MD, 100 mg at 10/19/14 1008 .  oxyCODONE (Oxy IR/ROXICODONE) immediate release tablet 5 mg, 5 mg, Oral, Q4H PRN, Lance Coon, MD, 5 mg at 10/19/14 0518 .  pantoprazole (PROTONIX) EC tablet 40 mg, 40 mg, Oral, Daily, Lance Coon, MD, 40 mg at 10/19/14 1008 .  senna-docusate (Senokot-S) tablet 1 tablet, 1 tablet, Oral, QHS PRN, Lance Coon, MD, 1 tablet at 10/18/14 1231 .  sertraline (ZOLOFT) tablet 50 mg, 50 mg, Oral, Daily, Lance Coon, MD, 50 mg at 10/19/14 1008 .  sodium chloride 0.9 % injection 3 mL, 3 mL, Intravenous, PRN, [name], MD .  traMADol Veatrice Bourbon) tablet 50 mg, 50 mg, Oral, Q6H PRN, Lance Coon, MD, 50 mg at 10/19/14 1405  PHYSICAL EXAM:  BP 102/50 mmHg  Pulse 89  Temp(Src) 98.5 F (36.9 C) (Oral)  Resp 18  Ht 5' (1.524 m)  Wt 50.758 kg (111 lb 14.4 oz)  BMI 21.85 kg/m2  SpO2 95%  General: thin female, in NAD HEENT: conjunctiva pale; OP moist without lesions. Neck: supple, trachea midline, no thyromegaly Chest: normal to palpation Lungs: clear bilaterally without retractions or wheezes Cardiovascular: irr irr with 2/6 SEM; distal pulses 1+ Abdomen: soft, nontender, nondistended, reduced bowel sounds Extremities: no clubbing, cyanosis, edema.  Post op changes left hip. Minimal discomfort over dorsum of right foot, without redness or bruising Neuro: alert and oriented,  moves all extremities Derm: no significant rashes or nodules; good skin turgor Lymph: no cervical or supraclavicular lymphadenopathy  Labs and imaging studies were reviewed  ASSESSMENT/PLAN:   1. Afib with rapid rate- stable with current regimen; continuing on short acting medication for next day or so, and if stable, can convert to cardizem CD.  Had been using ASA for anticoagulation due to high bleeding risk, and would plan to resume this when off prophyllactic lovenox 2. Acute blood loss anemia/leukocytosis- WBC has returned to normal; following hgb  periodically 3. Anxiety- better; cont sertraline; use buspirone, xanax PRN  4. Post op- as per ortho.  Foot pain appears improved. 5 d/c plan- appears stable for d/c to SNF when bed available  BERT Briscoe Burns III

## 2014-10-20 NOTE — Progress Notes (Signed)
Patient is discharge to snf in a stable condition, report given to floor nurse Almyra Free, family made aware of pt's discharge to snf, left by EMS , Wound site dsg clean dry and intact on discharge

## 2014-10-20 NOTE — Discharge Summary (Signed)
Physician Discharge Summary  Patient ID: Vickie Howard MRN: [id number] DOB/AGE: 1935-09-19 79 y.o.  Admit date: 10/15/2014 Discharge date: 10/20/2014  Admission Diagnoses: Afib with rapid rate  Discharge Diagnoses:  Principal Problem:   Atrial fibrillation with rapid ventricular response Active Problems:   Elevated troponin   GERD without esophagitis   HLD (hyperlipidemia)   HTN (hypertension)   Acute blood loss anemia   Discharged Condition: stable  Hospital Course: Admitted after recent left hip replacement; noted to have afib with rapid rate as well as leukocytosis.  Placed in CCU and started on cardizem drip.  HR was fairly easily controlled, though remained in atrial fibrillation. Evaluated by Cardiology.  Converted to oral cardizem and dose was titrated upward, with reasonable control.  BP remained slightly low (841-660 systolic) but reasonably tolerated.  Elevated troponin felt to represent demand ischemia from tachycardia  Blood and urine cultures were sent; 1 blood culture positive, but consistent with contaminant.  No evidence of infection was found, and leukocytosis resolved.  Anemia remained stable, and iron will be restarted at discharge  Anxiety level remains an issue; xanax added in place of buspirone, with better results.    Evaluated by Orthopedics for recent hip replacement. Continued to work with PT as well, with plan for pt to return to SNF. Continued on Lovenox at prophyllactic doses.  Plan to convert to ASA when done with lovenox for Orthopedic procedure, as felt to be too high risk for bleeding to go to full longterm anticoagulation, with risks/benefits previously addressed.    Developed right foot pain which limited ambulation; no clear injury. This improved over course of hospital stay.  Right foot films 5/17 showed arthritis with no fracture.  Pt to be discharged to SNF; physical activity up with walker with assistance as tolerated.  Diet is no added salt,  though may need to liberalize if PO intake does not improve.  F/u with Orthopedics as scheduled.  F/u with SNF physician.    Consults: cardiology; orthopedics  Discharge Exam: Blood pressure 121/38, pulse 77, temperature 98 F (36.7 C), temperature source Oral, resp. rate 17, height 5' (1.524 m), weight 50.758 kg (111 lb 14.4 oz), SpO2 99 %.  Disposition: 03-Skilled Nursing Facility      Discharge Instructions    Diet - low sodium heart healthy    Complete by:  As directed      Discharge wound care:    Complete by:  As directed   Change dressing as needed     Increase activity slowly    Complete by:  As directed   With walker with assistance     Walk with assistance    Complete by:  As directed      Walker     Complete by:  As directed             Medication List    STOP taking these medications        busPIRone 15 MG tablet  Commonly known as:  BUSPAR     meclizine 12.5 MG tablet  Commonly known as:  ANTIVERT     traMADol 50 MG tablet  Commonly known as:  ULTRAM      TAKE these medications        acetaminophen 500 MG tablet  Commonly known as:  TYLENOL  Take 1 tablet (500 mg total) by mouth every 6 (six) hours as needed for mild pain or fever.     ALPRAZolam 0.25 MG tablet  Commonly  known as:  XANAX  Take 1 tablet (0.25 mg total) by mouth 3 (three) times daily as needed for anxiety or sleep.     atorvastatin 20 MG tablet  Commonly known as:  LIPITOR  Take 20 mg by mouth daily.     diltiazem 60 MG tablet  Commonly known as:  CARDIZEM  Take 1 tablet (60 mg total) by mouth every 6 (six) hours.     enoxaparin 30 MG/0.3ML injection  Commonly known as:  LOVENOX  Inject 0.3 mLs (30 mg total) into the skin every 12 (twelve) hours.     feeding supplement (ENSURE ENLIVE) Liqd  Take 237 mLs by mouth 2 (two) times daily between meals.     gabapentin 100 MG capsule  Commonly known as:  NEURONTIN  Take 100 mg by mouth 1 day or 1 dose.     Iron 142 (45 FE) MG  Tbcr  Take 45 mg of iron by mouth 1 day or 1 dose.     lactulose 10 GM/15ML solution  Commonly known as:  CHRONULAC  Take 30 mLs (20 g total) by mouth 2 (two) times daily as needed for severe constipation (not relieved by MOM or dulcolax).     multivitamin with minerals tablet  Take 1 tablet by mouth daily. areds2     omeprazole 20 MG capsule  Commonly known as:  PRILOSEC  Take 20 mg by mouth daily.     oxyCODONE 5 MG immediate release tablet  Commonly known as:  Oxy IR/ROXICODONE  Take 1 tablet (5 mg total) by mouth every 4 (four) hours as needed for severe pain.     senna-docusate 8.6-50 MG per tablet  Commonly known as:  Senokot-S  Take 1 tablet by mouth at bedtime as needed for mild constipation.     sertraline 50 MG tablet  Commonly known as:  ZOLOFT  Take 50 mg by mouth daily.       Follow-up Information    Follow up with Your primary care doctor and cardiologist In 1 week.      Signed: [name] 10/20/2014, 1:42 PM

## 2014-10-20 NOTE — Clinical Social Work Note (Signed)
CSW notified pt, pt's son Coralyn [phone]- [phone], facility [facility], and RN that pt was set to DC today to facility via EMS.  CSW signing off.

## 2014-10-20 NOTE — Progress Notes (Signed)
Physical Therapy Treatment Patient Details Name: Vickie Howard MRN: [id number] DOB: 1935-06-15 Today's Date: 10/20/2014    History of Present Illness Pt is a 79yo white female who is s/p L THA (posterolateral) on 10/12/14. She was discharged from hospital on 10/16/14 and sent to [facility]; Patient was then brought back to hospital with A-fib and HR in 180's;  Pt reports history of R THA back in November 2015 and elected to undergo procedure after history of DJD in L hip.     PT Comments    Pt progressing toward all goals. L hip pain and R foot pain continue to limit patients ambulation distance. Pt notes an increase in R foot pain with ambulation, which has been addressed by nursing and MD with no further intervention at this time. Pt demonstrating increase in LLE strength with progression to AROM versus AAROM for exercises.   Follow Up Recommendations  SNF     Equipment Recommendations       Recommendations for Other Services       Precautions / Restrictions Precautions Precautions: Posterior Hip Restrictions Weight Bearing Restrictions: Yes LLE Weight Bearing: Weight bearing as tolerated    Mobility  Bed Mobility                  Transfers Overall transfer level: Needs assistance Equipment used: Standard walker Transfers: Sit to/from Stand Sit to Stand: Min guard            Ambulation/Gait Ambulation/Gait assistance: Min guard Ambulation Distance (Feet): 50 Feet Assistive device: Standard walker Gait Pattern/deviations: Step-through pattern;Decreased stride length;Decreased weight shift to left   Gait velocity interpretation: <1.8 ft/sec, indicative of risk for recurrent falls     Stairs            Wheelchair Mobility    Modified Rankin (Stroke Patients Only)       Balance                                    Cognition Arousal/Alertness: Awake/alert Behavior During Therapy: WFL for tasks assessed/performed Overall  Cognitive Status: Within Functional Limits for tasks assessed                      Exercises Total Joint Exercises Ankle Circles/Pumps: AROM;Both;20 reps (long sit) Quad Sets: Strengthening;Both;20 reps (long sit) Gluteal Sets: Strengthening;Both;20 reps (long sit) Towel Squeeze: Strengthening;Both;20 reps (long sit) Short Arc Quad: AROM;Both;20 reps (long sit) Heel Slides: AROM;Both;20 reps (long sit; partial range) Hip ABduction/ADduction: AROM;Both;20 reps (long sit) Straight Leg Raises: AAROM;Both;20 reps (long sit)    General Comments        Pertinent Vitals/Pain Pain Assessment: 0-10 Pain Score: 6  Pain Location: L hip (R dorsum foot pain 7/10 with ambulation) Pain Descriptors / Indicators: Constant (occasionally sharp) Pain Intervention(s): Limited activity within patient's tolerance;Monitored during session;Premedicated before session    Home Living                      Prior Function            PT Goals (current goals can now be found in the care plan section) Progress towards PT goals: Progressing toward goals    Frequency  7X/week    PT Plan Current plan remains appropriate    Co-evaluation             End of Session Equipment Utilized  During Treatment: Gait belt Activity Tolerance: Patient tolerated treatment well;Patient limited by pain (Mild increase in L quad muscle pain with exercise) Patient left: in bed;with call bell/phone within reach;with chair alarm set;with family/visitor present     Time: 9326-7124 PT Time Calculation (min) (ACUTE ONLY): 35 min  Charges:  $Gait Training: 8-22 mins $Therapeutic Exercise: 8-22 mins                    G Codes:      [name] 10/20/2014, 11:41 AM

## 2014-10-21 LAB — CULTURE, BLOOD (ROUTINE X 2)
Culture: NO GROWTH
SPECIAL REQUESTS: NORMAL
Special Requests: NORMAL

## 2014-10-29 DIAGNOSIS — D649 Anemia, unspecified: Secondary | ICD-10-CM | POA: Diagnosis present

## 2014-10-29 DIAGNOSIS — E876 Hypokalemia: Secondary | ICD-10-CM | POA: Diagnosis not present

## 2014-10-29 LAB — CBC WITH DIFFERENTIAL/PLATELET
BASOS PCT: 1 %
Basophils Absolute: 0 10*3/uL (ref 0–0.1)
EOS ABS: 0 10*3/uL (ref 0–0.7)
EOS PCT: 1 %
HCT: 28.4 % — ABNORMAL LOW (ref 35.0–47.0)
Hemoglobin: 9.5 g/dL — ABNORMAL LOW (ref 12.0–16.0)
LYMPHS ABS: 1.2 10*3/uL (ref 1.0–3.6)
Lymphocytes Relative: 23 %
MCH: 34.8 pg — ABNORMAL HIGH (ref 26.0–34.0)
MCHC: 33.4 g/dL (ref 32.0–36.0)
MCV: 104.1 fL — ABNORMAL HIGH (ref 80.0–100.0)
MONO ABS: 1.2 10*3/uL — AB (ref 0.2–0.9)
Monocytes Relative: 23 %
Neutro Abs: 2.8 10*3/uL (ref 1.4–6.5)
Neutrophils Relative %: 52 %
Platelets: 383 10*3/uL (ref 150–440)
RBC: 2.73 MIL/uL — AB (ref 3.80–5.20)
RDW: 15.9 % — ABNORMAL HIGH (ref 11.5–14.5)
WBC: 5.3 10*3/uL (ref 3.6–11.0)

## 2014-10-29 LAB — COMPREHENSIVE METABOLIC PANEL
ALBUMIN: 3.2 g/dL — AB (ref 3.5–5.0)
ALK PHOS: 85 U/L (ref 38–126)
ALT: 18 U/L (ref 14–54)
ANION GAP: 7 (ref 5–15)
AST: 18 U/L (ref 15–41)
BILIRUBIN TOTAL: 0.2 mg/dL — AB (ref 0.3–1.2)
BUN: 17 mg/dL (ref 6–20)
CHLORIDE: 106 mmol/L (ref 101–111)
CO2: 28 mmol/L (ref 22–32)
CREATININE: 0.7 mg/dL (ref 0.44–1.00)
Calcium: 9.4 mg/dL (ref 8.9–10.3)
GLUCOSE: 92 mg/dL (ref 65–99)
Potassium: 2.9 mmol/L — CL (ref 3.5–5.1)
Sodium: 141 mmol/L (ref 135–145)
TOTAL PROTEIN: 6.9 g/dL (ref 6.5–8.1)

## 2014-10-30 DIAGNOSIS — D649 Anemia, unspecified: Secondary | ICD-10-CM | POA: Diagnosis not present

## 2014-10-30 LAB — POTASSIUM: POTASSIUM: 3.5 mmol/L (ref 3.5–5.1)

## 2014-11-03 DIAGNOSIS — D649 Anemia, unspecified: Secondary | ICD-10-CM | POA: Diagnosis not present

## 2014-11-03 LAB — POTASSIUM: Potassium: 3.1 mmol/L — ABNORMAL LOW (ref 3.5–5.1)

## 2015-08-09 ENCOUNTER — Emergency Department: Payer: Medicare Other

## 2015-08-09 ENCOUNTER — Observation Stay
Admission: EM | Admit: 2015-08-09 | Discharge: 2015-08-15 | Payer: Medicare Other | Attending: Internal Medicine | Admitting: Internal Medicine

## 2015-08-09 ENCOUNTER — Encounter: Payer: Self-pay | Admitting: Emergency Medicine

## 2015-08-09 DIAGNOSIS — R918 Other nonspecific abnormal finding of lung field: Secondary | ICD-10-CM | POA: Insufficient documentation

## 2015-08-09 DIAGNOSIS — E869 Volume depletion, unspecified: Secondary | ICD-10-CM | POA: Insufficient documentation

## 2015-08-09 DIAGNOSIS — K219 Gastro-esophageal reflux disease without esophagitis: Secondary | ICD-10-CM | POA: Diagnosis not present

## 2015-08-09 DIAGNOSIS — Z9851 Tubal ligation status: Secondary | ICD-10-CM | POA: Diagnosis not present

## 2015-08-09 DIAGNOSIS — Z96643 Presence of artificial hip joint, bilateral: Secondary | ICD-10-CM | POA: Insufficient documentation

## 2015-08-09 DIAGNOSIS — I517 Cardiomegaly: Secondary | ICD-10-CM | POA: Diagnosis not present

## 2015-08-09 DIAGNOSIS — R011 Cardiac murmur, unspecified: Secondary | ICD-10-CM | POA: Insufficient documentation

## 2015-08-09 DIAGNOSIS — I509 Heart failure, unspecified: Secondary | ICD-10-CM

## 2015-08-09 DIAGNOSIS — Z803 Family history of malignant neoplasm of breast: Secondary | ICD-10-CM | POA: Insufficient documentation

## 2015-08-09 DIAGNOSIS — I700 Atherosclerosis of aorta: Secondary | ICD-10-CM | POA: Diagnosis not present

## 2015-08-09 DIAGNOSIS — R001 Bradycardia, unspecified: Secondary | ICD-10-CM | POA: Diagnosis not present

## 2015-08-09 DIAGNOSIS — Z79899 Other long term (current) drug therapy: Secondary | ICD-10-CM | POA: Insufficient documentation

## 2015-08-09 DIAGNOSIS — Z7982 Long term (current) use of aspirin: Secondary | ICD-10-CM | POA: Diagnosis not present

## 2015-08-09 DIAGNOSIS — M810 Age-related osteoporosis without current pathological fracture: Secondary | ICD-10-CM | POA: Diagnosis not present

## 2015-08-09 DIAGNOSIS — M25552 Pain in left hip: Secondary | ICD-10-CM | POA: Insufficient documentation

## 2015-08-09 DIAGNOSIS — R0789 Other chest pain: Secondary | ICD-10-CM | POA: Insufficient documentation

## 2015-08-09 DIAGNOSIS — M40209 Unspecified kyphosis, site unspecified: Secondary | ICD-10-CM | POA: Insufficient documentation

## 2015-08-09 DIAGNOSIS — K449 Diaphragmatic hernia without obstruction or gangrene: Secondary | ICD-10-CM | POA: Diagnosis not present

## 2015-08-09 DIAGNOSIS — M545 Low back pain, unspecified: Secondary | ICD-10-CM

## 2015-08-09 DIAGNOSIS — N179 Acute kidney failure, unspecified: Secondary | ICD-10-CM | POA: Insufficient documentation

## 2015-08-09 DIAGNOSIS — I10 Essential (primary) hypertension: Secondary | ICD-10-CM | POA: Diagnosis not present

## 2015-08-09 DIAGNOSIS — E875 Hyperkalemia: Secondary | ICD-10-CM

## 2015-08-09 DIAGNOSIS — W19XXXA Unspecified fall, initial encounter: Secondary | ICD-10-CM | POA: Insufficient documentation

## 2015-08-09 DIAGNOSIS — L8990 Pressure ulcer of unspecified site, unspecified stage: Secondary | ICD-10-CM | POA: Insufficient documentation

## 2015-08-09 DIAGNOSIS — M25551 Pain in right hip: Secondary | ICD-10-CM

## 2015-08-09 DIAGNOSIS — R55 Syncope and collapse: Secondary | ICD-10-CM

## 2015-08-09 DIAGNOSIS — R0602 Shortness of breath: Secondary | ICD-10-CM | POA: Diagnosis not present

## 2015-08-09 DIAGNOSIS — S329XXA Fracture of unspecified parts of lumbosacral spine and pelvis, initial encounter for closed fracture: Secondary | ICD-10-CM | POA: Diagnosis present

## 2015-08-09 DIAGNOSIS — Z8249 Family history of ischemic heart disease and other diseases of the circulatory system: Secondary | ICD-10-CM | POA: Insufficient documentation

## 2015-08-09 DIAGNOSIS — R52 Pain, unspecified: Secondary | ICD-10-CM

## 2015-08-09 DIAGNOSIS — I498 Other specified cardiac arrhythmias: Secondary | ICD-10-CM

## 2015-08-09 DIAGNOSIS — M1990 Unspecified osteoarthritis, unspecified site: Secondary | ICD-10-CM | POA: Diagnosis not present

## 2015-08-09 DIAGNOSIS — F329 Major depressive disorder, single episode, unspecified: Secondary | ICD-10-CM | POA: Diagnosis not present

## 2015-08-09 DIAGNOSIS — J9811 Atelectasis: Secondary | ICD-10-CM | POA: Insufficient documentation

## 2015-08-09 DIAGNOSIS — E785 Hyperlipidemia, unspecified: Secondary | ICD-10-CM | POA: Insufficient documentation

## 2015-08-09 DIAGNOSIS — N289 Disorder of kidney and ureter, unspecified: Secondary | ICD-10-CM

## 2015-08-09 DIAGNOSIS — S32501A Unspecified fracture of right pubis, initial encounter for closed fracture: Secondary | ICD-10-CM | POA: Diagnosis not present

## 2015-08-09 DIAGNOSIS — D72829 Elevated white blood cell count, unspecified: Secondary | ICD-10-CM | POA: Diagnosis not present

## 2015-08-09 DIAGNOSIS — M8580 Other specified disorders of bone density and structure, unspecified site: Secondary | ICD-10-CM | POA: Diagnosis not present

## 2015-08-09 DIAGNOSIS — R42 Dizziness and giddiness: Secondary | ICD-10-CM | POA: Insufficient documentation

## 2015-08-09 DIAGNOSIS — S32591A Other specified fracture of right pubis, initial encounter for closed fracture: Secondary | ICD-10-CM

## 2015-08-09 DIAGNOSIS — I482 Chronic atrial fibrillation: Secondary | ICD-10-CM | POA: Diagnosis not present

## 2015-08-09 DIAGNOSIS — Z881 Allergy status to other antibiotic agents status: Secondary | ICD-10-CM | POA: Diagnosis not present

## 2015-08-09 DIAGNOSIS — I495 Sick sinus syndrome: Secondary | ICD-10-CM | POA: Diagnosis not present

## 2015-08-09 DIAGNOSIS — Z885 Allergy status to narcotic agent status: Secondary | ICD-10-CM | POA: Diagnosis not present

## 2015-08-09 DIAGNOSIS — G629 Polyneuropathy, unspecified: Secondary | ICD-10-CM | POA: Diagnosis not present

## 2015-08-09 DIAGNOSIS — S3210XA Unspecified fracture of sacrum, initial encounter for closed fracture: Secondary | ICD-10-CM | POA: Diagnosis not present

## 2015-08-09 DIAGNOSIS — I480 Paroxysmal atrial fibrillation: Secondary | ICD-10-CM | POA: Insufficient documentation

## 2015-08-09 LAB — URINALYSIS COMPLETE WITH MICROSCOPIC (ARMC ONLY)
Bacteria, UA: NONE SEEN
Bilirubin Urine: NEGATIVE
Glucose, UA: NEGATIVE mg/dL
HGB URINE DIPSTICK: NEGATIVE
KETONES UR: NEGATIVE mg/dL
LEUKOCYTES UA: NEGATIVE
Nitrite: NEGATIVE
PH: 5 (ref 5.0–8.0)
PROTEIN: NEGATIVE mg/dL
RBC / HPF: NONE SEEN RBC/hpf (ref 0–5)
SPECIFIC GRAVITY, URINE: 1.008 (ref 1.005–1.030)

## 2015-08-09 LAB — BASIC METABOLIC PANEL
Anion gap: 10 (ref 5–15)
BUN: 24 mg/dL — AB (ref 6–20)
CHLORIDE: 111 mmol/L (ref 101–111)
CO2: 18 mmol/L — ABNORMAL LOW (ref 22–32)
Calcium: 9.7 mg/dL (ref 8.9–10.3)
Creatinine, Ser: 1.06 mg/dL — ABNORMAL HIGH (ref 0.44–1.00)
GFR calc Af Amer: 57 mL/min — ABNORMAL LOW (ref >60)
GFR, EST NON AFRICAN AMERICAN: 49 mL/min — AB (ref >60)
Glucose, Bld: 103 mg/dL — ABNORMAL HIGH (ref 65–99)
Potassium: 5.4 mmol/L — ABNORMAL HIGH (ref 3.5–5.1)
SODIUM: 139 mmol/L (ref 135–145)

## 2015-08-09 LAB — TSH: TSH: 2.415 u[IU]/mL (ref 0.350–4.500)

## 2015-08-09 LAB — CBC
HCT: 29.7 % — ABNORMAL LOW (ref 35.0–47.0)
Hemoglobin: 9.8 g/dL — ABNORMAL LOW (ref 12.0–16.0)
MCH: 35.7 pg — AB (ref 26.0–34.0)
MCHC: 33.1 g/dL (ref 32.0–36.0)
MCV: 107.9 fL — ABNORMAL HIGH (ref 80.0–100.0)
PLATELETS: 179 10*3/uL (ref 150–440)
RBC: 2.75 MIL/uL — ABNORMAL LOW (ref 3.80–5.20)
RDW: 16.5 % — AB (ref 11.5–14.5)
WBC: 12.3 10*3/uL — ABNORMAL HIGH (ref 3.6–11.0)

## 2015-08-09 LAB — TROPONIN I
TROPONIN I: 0.03 ng/mL (ref <0.031)
Troponin I: 0.03 ng/mL (ref <0.031)
Troponin I: <0.03 ng/mL (ref <0.031)

## 2015-08-09 LAB — DIGOXIN LEVEL: Digoxin Level: 1.7 ng/mL (ref 0.8–2.0)

## 2015-08-09 MED ORDER — TRAMADOL HCL 50 MG PO TABS
50.0000 mg | ORAL_TABLET | Freq: Four times a day (QID) | ORAL | Status: DC | PRN
  Administered 2015-08-09 – 2015-08-14 (×11): 50 mg via ORAL
  Filled 2015-08-09 (×11): qty 1

## 2015-08-09 MED ORDER — FERROUS SULFATE 325 (65 FE) MG PO TABS
325.0000 mg | ORAL_TABLET | Freq: Every day | ORAL | Status: DC
  Administered 2015-08-10 – 2015-08-15 (×6): 325 mg via ORAL
  Filled 2015-08-09 (×6): qty 1

## 2015-08-09 MED ORDER — SENNA 8.6 MG PO TABS
1.0000 | ORAL_TABLET | Freq: Every day | ORAL | Status: DC | PRN
  Administered 2015-08-15: 8.6 mg via ORAL
  Filled 2015-08-09: qty 1

## 2015-08-09 MED ORDER — ENOXAPARIN SODIUM 40 MG/0.4ML ~~LOC~~ SOLN
40.0000 mg | SUBCUTANEOUS | Status: DC
  Administered 2015-08-09 – 2015-08-10 (×2): 40 mg via SUBCUTANEOUS
  Filled 2015-08-09 (×2): qty 0.4

## 2015-08-09 MED ORDER — GABAPENTIN 100 MG PO CAPS
100.0000 mg | ORAL_CAPSULE | Freq: Every day | ORAL | Status: DC
  Administered 2015-08-10 – 2015-08-15 (×6): 100 mg via ORAL
  Filled 2015-08-09 (×6): qty 1

## 2015-08-09 MED ORDER — ONDANSETRON HCL 4 MG/2ML IJ SOLN
4.0000 mg | Freq: Four times a day (QID) | INTRAMUSCULAR | Status: DC | PRN
  Administered 2015-08-10: 4 mg via INTRAVENOUS
  Filled 2015-08-09: qty 2

## 2015-08-09 MED ORDER — ACETAMINOPHEN 325 MG PO TABS
650.0000 mg | ORAL_TABLET | Freq: Once | ORAL | Status: AC
  Administered 2015-08-09: 650 mg via ORAL

## 2015-08-09 MED ORDER — ASPIRIN EC 81 MG PO TBEC: 81.0000 mg | DELAYED_RELEASE_TABLET | Freq: Every day | ORAL | Status: DC

## 2015-08-09 MED ORDER — DOCUSATE SODIUM 100 MG PO CAPS: 100.0000 mg | ORAL_CAPSULE | Freq: Two times a day (BID) | ORAL | Status: DC

## 2015-08-09 MED ORDER — ACETAMINOPHEN 650 MG RE SUPP: 650.0000 mg | Freq: Four times a day (QID) | RECTAL | Status: DC | PRN

## 2015-08-09 MED ORDER — SERTRALINE HCL 50 MG PO TABS
50.0000 mg | ORAL_TABLET | Freq: Every day | ORAL | Status: DC
  Administered 2015-08-10 – 2015-08-15 (×6): 50 mg via ORAL
  Filled 2015-08-09 (×6): qty 1

## 2015-08-09 MED ORDER — ACETAMINOPHEN 325 MG PO TABS
650.0000 mg | ORAL_TABLET | Freq: Four times a day (QID) | ORAL | Status: DC | PRN
  Administered 2015-08-09 – 2015-08-14 (×7): 650 mg via ORAL
  Filled 2015-08-09 (×7): qty 2

## 2015-08-09 MED ORDER — ONDANSETRON HCL 4 MG PO TABS: 4.0000 mg | ORAL_TABLET | Freq: Four times a day (QID) | ORAL | Status: DC | PRN

## 2015-08-09 MED ORDER — PANTOPRAZOLE SODIUM 40 MG PO TBEC
40.0000 mg | DELAYED_RELEASE_TABLET | Freq: Every day | ORAL | Status: DC
  Administered 2015-08-10 – 2015-08-15 (×6): 40 mg via ORAL
  Filled 2015-08-09 (×6): qty 1

## 2015-08-09 MED ORDER — ASPIRIN EC 81 MG PO TBEC
81.0000 mg | DELAYED_RELEASE_TABLET | Freq: Every day | ORAL | Status: DC
  Administered 2015-08-10 – 2015-08-15 (×6): 81 mg via ORAL
  Filled 2015-08-09 (×7): qty 1

## 2015-08-09 MED ORDER — SODIUM CHLORIDE 0.9 % IV SOLN
INTRAVENOUS | Status: AC
  Administered 2015-08-09: 17:00:00 via INTRAVENOUS

## 2015-08-09 MED ORDER — ACETAMINOPHEN 500 MG PO TABS
1000.0000 mg | ORAL_TABLET | Freq: Four times a day (QID) | ORAL | Status: DC | PRN
  Administered 2015-08-10: 1000 mg via ORAL
  Filled 2015-08-09: qty 2

## 2015-08-09 MED ORDER — ATORVASTATIN CALCIUM 20 MG PO TABS
20.0000 mg | ORAL_TABLET | Freq: Every day | ORAL | Status: DC
  Administered 2015-08-10 – 2015-08-15 (×6): 20 mg via ORAL
  Filled 2015-08-09 (×6): qty 1

## 2015-08-09 MED ORDER — OXYCODONE-ACETAMINOPHEN 5-325 MG PO TABS
1.0000 | ORAL_TABLET | Freq: Once | ORAL | Status: DC
  Filled 2015-08-09: qty 1

## 2015-08-09 MED ORDER — SODIUM CHLORIDE 0.9% FLUSH
3.0000 mL | Freq: Two times a day (BID) | INTRAVENOUS | Status: DC
  Administered 2015-08-09 – 2015-08-14 (×10): 3 mL via INTRAVENOUS

## 2015-08-09 MED ORDER — NITROGLYCERIN 2 % TD OINT
0.5000 [in_us] | TOPICAL_OINTMENT | Freq: Four times a day (QID) | TRANSDERMAL | Status: DC
  Administered 2015-08-09 – 2015-08-11 (×6): 0.5 [in_us] via TOPICAL
  Filled 2015-08-09 (×6): qty 1

## 2015-08-09 MED ORDER — DOCUSATE SODIUM 100 MG PO CAPS
100.0000 mg | ORAL_CAPSULE | Freq: Two times a day (BID) | ORAL | Status: DC
  Administered 2015-08-09 – 2015-08-15 (×12): 100 mg via ORAL
  Filled 2015-08-09 (×12): qty 1

## 2015-08-09 MED ORDER — CYANOCOBALAMIN 1000 MCG/ML IJ SOLN
1000.0000 ug | INTRAMUSCULAR | Status: DC
  Filled 2015-08-09: qty 1

## 2015-08-09 MED ORDER — ACETAMINOPHEN 325 MG PO TABS
ORAL_TABLET | ORAL | Status: AC
  Administered 2015-08-09: 650 mg via ORAL
  Filled 2015-08-09: qty 2

## 2015-08-09 NOTE — ED Notes (Signed)
Pt placed on bed to use, pt used it w/o any problems.

## 2015-08-09 NOTE — Progress Notes (Signed)
Pt. admitted to unit, [id number] from ED, report from [place], [place]. Oriented to room, call bell, Ascom phones and staff. Bed in low position. Fall safety plan reviewed, yellow non-skid socks in place, bed alarm on. Full assessment to Epic; skin assessed with Particia Nearing, RN. Telemetry box verified with central tele and Reather Converse: [id number]. Patient received pain medication prior to transfer to 2A. Will continue to monitor.

## 2015-08-09 NOTE — H&P (Signed)
[facility] NAME: Vickie Howard    MR#:  [id number]  DATE OF BIRTH:  10-17-1935  DATE OF ADMISSION:  08/09/2015  PRIMARY CARE PHYSICIAN: [name], MD   REQUESTING/REFERRING PHYSICIAN:   CHIEF COMPLAINT:   Chief Complaint  Patient presents with  . Hip Pain    HISTORY OF PRESENT ILLNESS: Vickie Howard  is a 80 y.o. female with a known history of heart murmur, paroxysmal atrial fibrillation, hypertension, hyperlipidemia, who presents to the hospital with complaints of passing out approximately 10 days ago. Apparently patient was sitting at the dinner table and the she felt like she was about to pass out and she in fact passed out and felt off the chair. EMS was called. However, her vital signs were stable at that time and patient refused to come to emergency room for evaluation. Since her fall, she is having difficulty walking and complaining of bilateral hip and back pains. She is using walker to walk, however, is getting progressively weaker. On arrival to emergency room, she was noted to have acute renal insufficiency, hyperkalemia, leukocytosis, anemia, x-ray of her hip revealed right inferior pubic ramus fracture and sacral fracture. EKG showed junctional rhythm at the rate of 48 with LVH at a presentation of the abnormalities, ST depressions in inferior leads. Hospitalist services were contacted for than for admission    PAST MEDICAL HISTORY:   Past Medical History  Diagnosis Date  . Heart murmur   . History of hiatal hernia   . Arthritis   . Occasional tremors     hands  . Atrial fibrillation ([place])   . HLD (hyperlipidemia)   . HTN (hypertension)   . GERD (gastroesophageal reflux disease)     PAST SURGICAL HISTORY: Past Surgical History  Procedure Laterality Date  . Tubal ligation    . Joint replacement      right total hip 2015  . Left total hip arthroplasty  10/12/2014  . Total hip arthroplasty Left  10/12/2014    Procedure: TOTAL HIP ARTHROPLASTY;  Surgeon: Dereck Leep, MD;  Location: [facility] ORS;  Service: Orthopedics;  Laterality: Left;    SOCIAL HISTORY:  Social History  Substance Use Topics  . Smoking status: Never Smoker   . Smokeless tobacco: Not on file  . Alcohol Use: No    FAMILY HISTORY:  Family History  Problem Relation Age of Onset  . Aneurysm Father   . Breast cancer Daughter     DRUG ALLERGIES:  Allergies  Allergen Reactions  . Codeine Sulfate Other (See Comments)    Reaction:  Syncope  . Levaquin [Levofloxacin] Nausea And Vomiting  . Oxycodone Other (See Comments)    Reaction:  Disoriented     Review of Systems  Constitutional: Negative for fever, chills, weight loss and malaise/fatigue.  HENT: Negative for congestion.   Eyes: Negative for blurred vision and double vision.  Respiratory: Negative for cough, sputum production, shortness of breath and wheezing.   Cardiovascular: Negative for chest pain, palpitations, orthopnea, leg swelling and PND.  Gastrointestinal: Positive for heartburn. Negative for nausea, vomiting, abdominal pain, diarrhea, constipation, blood in stool and melena.  Genitourinary: Negative for dysuria, urgency, frequency and hematuria.  Musculoskeletal: Positive for back pain and falls.  Skin: Negative for rash.  Neurological: Positive for loss of consciousness and weakness. Negative for dizziness.  Psychiatric/Behavioral: Negative for depression and memory loss. The patient is not nervous/anxious.     MEDICATIONS AT  HOME:  Prior to Admission medications   Medication Sig Start Date End Date Taking? Authorizing Provider  acetaminophen (TYLENOL) 500 MG tablet Take 1,000 mg by mouth every 6 (six) hours as needed for mild pain or headache.   Yes Historical Provider, MD  aspirin EC 81 MG tablet Take 81 mg by mouth daily.   Yes Historical Provider, MD  atorvastatin (LIPITOR) 20 MG tablet Take 20 mg by mouth daily.   Yes Historical  Provider, MD  cyanocobalamin (,VITAMIN B-12,) 1000 MCG/ML injection Inject 1,000 mcg into the muscle every 30 (thirty) days.   Yes Historical Provider, MD  digoxin (LANOXIN) 0.125 MG tablet Take 0.125 mg by mouth daily.   Yes Historical Provider, MD  diltiazem (CARDIZEM) 90 MG tablet Take 90 mg by mouth 4 (four) times daily.   Yes Historical Provider, MD  docusate sodium (COLACE) 100 MG capsule Take 100 mg by mouth 2 (two) times daily.   Yes Historical Provider, MD  ferrous sulfate 325 (65 FE) MG tablet Take 325 mg by mouth daily with breakfast.   Yes Historical Provider, MD  gabapentin (NEURONTIN) 100 MG capsule Take 100 mg by mouth daily.    Yes Historical Provider, MD  Multiple Vitamins-Minerals (PRESERVISION AREDS 2) CAPS Take 1 capsule by mouth daily.   Yes Historical Provider, MD  omeprazole (PRILOSEC) 20 MG capsule Take 20 mg by mouth daily.   Yes Historical Provider, MD  potassium chloride SA (K-DUR,KLOR-CON) 20 MEQ tablet Take 40 mEq by mouth 2 (two) times daily.   Yes Historical Provider, MD  sertraline (ZOLOFT) 50 MG tablet Take 50 mg by mouth daily.   Yes Historical Provider, MD      PHYSICAL EXAMINATION:   VITAL SIGNS: Blood pressure 128/79, pulse 61, temperature 98.2 F (36.8 C), temperature source Oral, resp. rate 18, weight 50.349 kg (111 lb), SpO2 98 %.  GENERAL:  80 y.o.-year-old patient lying in the bed in mild to moderate distress due to pain in the back and hips.  EYES: Pupils equal, round, reactive to light and accommodation. No scleral icterus. Extraocular muscles intact.  HEENT: Head atraumatic, normocephalic. Oropharynx and nasopharynx clear.  NECK:  Supple, no jugular venous distention. No thyroid enlargement, no tenderness.  LUNGS: Normal breath sounds bilaterally, no wheezing, rales,rhonchi or crepitation. No use of accessory muscles of respiration.  CARDIOVASCULAR: S1, S2 normal. 4/6 systolic murmur was heard in aortic auscultation side radiating to bilateral  carotids , no rubs, or gallops.  ABDOMEN: Soft, nontender, nondistended. Bowel sounds present. No organomegaly or mass.  EXTREMITIES: No pedal edema, cyanosis, or clubbing.  NEUROLOGIC: Cranial nerves II through XII are intact. Muscle strength 5/5 in all extremities. Sensation intact. Gait not checked.  PSYCHIATRIC: The patient is alert and oriented x 3. Uncomfortable and grimacing, restless SKIN: No obvious rash, lesion, or ulcer.   LABORATORY PANEL:   CBC  Recent Labs Lab 08/09/15 1147  WBC 12.3*  HGB 9.8*  HCT 29.7*  PLT 179  MCV 107.9*  MCH 35.7*  MCHC 33.1  RDW 16.5*   ------------------------------------------------------------------------------------------------------------------  Chemistries   Recent Labs Lab 08/09/15 1147  NA 139  K 5.4*  CL 111  CO2 18*  GLUCOSE 103*  BUN 24*  CREATININE 1.06*  CALCIUM 9.7   ------------------------------------------------------------------------------------------------------------------  Cardiac Enzymes  Recent Labs Lab 08/09/15 1147  TROPONINI 0.03   ------------------------------------------------------------------------------------------------------------------  RADIOLOGY: Dg Chest 2 View  08/09/2015  CLINICAL DATA:  Low back and bilateral hip pain since falling approximately 10 days ago. Syncopal  episode. EXAM: CHEST  2 VIEW COMPARISON:  Portable examinations 10/15/2014 and 05/01/2014. FINDINGS: Chronic left hemidiaphragm elevation appears unchanged. There is stable mild cardiomegaly and aortic atherosclerosis. The lungs are clear. No pneumothorax or significant pleural effusion is seen. The bones appear unchanged. IMPRESSION: No acute cardiopulmonary process. Electronically Signed   By: Richardean Sale M.D.   On: 08/09/2015 11:46   Dg Lumbar Spine Complete  08/09/2015  CLINICAL DATA:  Low back and bilateral hip pain since falling 10 days ago. Syncopal episode. EXAM: LUMBAR SPINE - COMPLETE 4+ VIEW COMPARISON:  One  view abdomen 04/2014.  Hip radiographs 10/12/2014. FINDINGS: The bones are demineralized. There is transitional lumbosacral anatomy with a partially lumbarized S1 segment. The alignment is near anatomic. There is a convex right scoliosis and a grade 1 anterolisthesis at L5-S1. Mild facet degenerative changes are present throughout the lumbar spine. No acute osseous findings are seen within the spine. However, there is angulation of the anterior cortex of the upper sacrum on the lateral view which could reflect a fracture. In addition, there are fractures of the right superior and inferior pubic rami which are not apparent on the prior radiographs. These are indeterminate in age and potentially subacute. IMPRESSION: 1. No acute osseous findings seen within the lumbar spine. 2. Age-indeterminate fractures of the sacrum and right pubic rami. Electronically Signed   By: Richardean Sale M.D.   On: 08/09/2015 11:51   Dg Hips Bilat With Pelvis 3-4 Views  08/09/2015  CLINICAL DATA:  Fall 10 days ago, bilateral hip pain EXAM: DG HIP (WITH OR WITHOUT PELVIS) 3-4V BILAT COMPARISON:  10/12/2014 and 04/28/2015. FINDINGS: Five views bilateral hip submitted. There is diffuse osteopenia. Bilateral hip prosthesis with anatomic alignment. Degenerative changes pubic symphysis. There is interval fracture deformity of the right inferior pubic ramus of indeterminate age. Clinical correlation is necessary. IMPRESSION: Diffuse osteopenia. Bilateral hip prosthesis with anatomic alignment. Interval fracture deformity of the right inferior pubic ramus of indeterminate age. Clinical correlation is necessary. Electronically Signed   By: Lahoma Crocker M.D.   On: 08/09/2015 11:50    EKG: Orders placed or performed during the hospital encounter of 10/15/14  . EKG 12-Lead  . EKG 12-Lead  . EKG    IMPRESSION AND PLAN:  Principal Problem:   Fracture of right inferior pubic ramus (HCC) Active Problems:   Sacral fracture (HCC)    Syncope   Junctional rhythm   Bradycardia   Hyperkalemia   Acute renal insufficiency   Leukocytosis   Essential hypertension   Pelvic fracture (HCC)   Pelvic fracture, closed, initial encounter  #1. Pelvic fracture, closed, initial encounter. Admit patient to a medical floor, initiate her on tramadol. Get physical therapist involved for further recommendations #2 junctional rhythm, hold patient's digoxin and Cardizem, get digoxin level, follow patient on telemetry. Check cardiac enzymes 3. Continue patient on aspirin therapy. #3. Syncope, questionable related to arrhythmia/bradycardia. Patient was evaluated by paramedics during syncopal episode and no bradycardia was noted at that time. Get echocardiogram done to rule out severe aortic stenosis, get cardiologist involved. #4 acute renal insufficiency, initiate patient on IV fluids, follow creatinine level in the morning, no urinary tract infection per urinalysis. #5. Leukocytosis, unclear etiology, could be related to stress, follow with no antibiotic therapy. #6. Hyperkalemia. Stop patient's potassium supplements, continue patient on IV fluids, follow patient's potassium level in the morning  All the records are reviewed and case discussed with ED provider. Management plans discussed with the patient, family and  they are in agreement.  CODE STATUS: Code Status History    Date Active Date Inactive Code Status Order ID Comments User Context   10/16/2014  3:06 AM 10/20/2014  8:46 PM DNR [id number]  Lance Coon, MD Inpatient   10/12/2014 12:20 PM 10/15/2014 10:27 PM Full Code [id number]  Dereck Leep, MD Inpatient    Questions for Most Recent Historical Code Status (Order [id number])    Question Answer Comment   In the event of cardiac or respiratory ARREST Do not call a "code blue"    In the event of cardiac or respiratory ARREST Do not perform Intubation, CPR, defibrillation or ACLS    In the event of cardiac or respiratory ARREST Use  medication by any route, position, wound care, and other measures to relive pain and suffering. May use oxygen, suction and manual treatment of airway obstruction as needed for comfort.        TOTAL TIME TAKING CARE OF THIS PATIENT: 55 minutes.    [name] M.D on 08/09/2015 at 3:05 PM  Between 7am to 6pm - Pager - [phone] After 6pm go to [email or web address] - password [email or web address] [facility]  [facility]  Office  [phone]  CC: Primary care physician; Adin Hector, MD

## 2015-08-09 NOTE — ED Notes (Signed)
Patient transported to X-ray 

## 2015-08-09 NOTE — ED Notes (Signed)
Pts son reports that pt fell a week ago out of a chair onto carpet. PT stood after fall and son reports was able to wash dishes and had no complaints. Son reports pt has been increasingly reporting 10/10 pain on both hips and has been able to ambulate with a cane up until today when pt was placed in a wheelchair at PCP and reported pain increased to where pt felt as though she could not walk. Son reports EMS was called after initial fall and was cleared as having stable vital signs and had declined transportation to ED.

## 2015-08-09 NOTE — ED Notes (Signed)
Fell 10 days ago, reports hips hurt when she walks.

## 2015-08-09 NOTE — ED Provider Notes (Signed)
[facility] Emergency Department Provider Note  ____________________________________________  Time seen: Approximately 11:06 AM  I have reviewed the triage vital signs and the nursing notes.   HISTORY  Chief Complaint Hip Pain    HPI Vickie Howard is a 80 y.o. female with a history of A. fib, HTN, presenting with bilateral hip pain. The patient reports that 10 days ago she was sitting at her dining room table when she had the acute onset of a lightheaded feeling, "I knew I was going to pass out." The next thing she knew she was on the ground, "it was hard but is able to pick myself." EMS was called and she was evaluated at the home but she refused transport. Initially, she was doing well but over the past several days she has developed progressively worsening bilateral hip pain now requiring walker use for ambulation one prior she was only using a cane, as well as low back pain. She denies any numbness, tingle in her weakness, or any urinary or fecal incontinence or retention. She denies any chest pain, shortness of breath, palpitations, lightheadedness or further syncopal episodes. She has not had any recent changes in her medications.   Past Medical History  Diagnosis Date  . Heart murmur   . History of hiatal hernia   . Arthritis   . Occasional tremors     hands  . Atrial fibrillation ([place])   . HLD (hyperlipidemia)   . HTN (hypertension)   . GERD (gastroesophageal reflux disease)     Patient Active Problem List   Diagnosis Date Noted  . Atrial fibrillation with rapid ventricular response ([place]) 10/16/2014  . Elevated troponin 10/16/2014  . Atrial fibrillation ([place]) 10/16/2014  . GERD without esophagitis 10/16/2014  . HLD (hyperlipidemia) 10/16/2014  . HTN (hypertension) 10/16/2014  . Acute blood loss anemia 10/16/2014  . S/P total hip arthroplasty 10/12/2014    Past Surgical History  Procedure Laterality Date  . Tubal ligation    . Joint  replacement      right total hip 2015  . Left total hip arthroplasty  10/12/2014  . Total hip arthroplasty Left 10/12/2014    Procedure: TOTAL HIP ARTHROPLASTY;  Surgeon: Dereck Leep, MD;  Location: [facility] ORS;  Service: Orthopedics;  Laterality: Left;    Current Outpatient Rx  Name  Route  Sig  Dispense  Refill  . acetaminophen (TYLENOL) 500 MG tablet   Oral   Take 1 tablet (500 mg total) by mouth every 6 (six) hours as needed for mild pain or fever.   30 tablet   0   . ALPRAZolam (XANAX) 0.25 MG tablet   Oral   Take 1 tablet (0.25 mg total) by mouth 3 (three) times daily as needed for anxiety or sleep.   30 tablet   0   . atorvastatin (LIPITOR) 20 MG tablet   Oral   Take 20 mg by mouth daily.         Marland Kitchen diltiazem (CARDIZEM) 60 MG tablet   Oral   Take 1 tablet (60 mg total) by mouth every 6 (six) hours.   120 tablet   0   . enoxaparin (LOVENOX) 30 MG/0.3ML injection   Subcutaneous   Inject 0.3 mLs (30 mg total) into the skin every 12 (twelve) hours.   28 Syringe   0   . feeding supplement, ENSURE ENLIVE, (ENSURE ENLIVE) LIQD   Oral   Take 237 mLs by mouth 2 (two) times daily between meals.  237 mL   12   . Ferrous Sulfate (IRON) 142 (45 FE) MG TBCR   Oral   Take 45 mg of iron by mouth 1 day or 1 dose.         . gabapentin (NEURONTIN) 100 MG capsule   Oral   Take 100 mg by mouth 1 day or 1 dose.         . lactulose (CHRONULAC) 10 GM/15ML solution   Oral   Take 30 mLs (20 g total) by mouth 2 (two) times daily as needed for severe constipation (not relieved by MOM or dulcolax).   240 mL   0   . Multiple Vitamins-Minerals (MULTIVITAMIN WITH MINERALS) tablet   Oral   Take 1 tablet by mouth daily. [id number]         . omeprazole (PRILOSEC) 20 MG capsule   Oral   Take 20 mg by mouth daily.         Marland Kitchen oxyCODONE (OXY IR/ROXICODONE) 5 MG immediate release tablet   Oral   Take 1 tablet (5 mg total) by mouth every 4 (four) hours as needed for severe pain.    30 tablet   0   . senna-docusate (SENOKOT-S) 8.6-50 MG per tablet   Oral   Take 1 tablet by mouth at bedtime as needed for mild constipation.   30 tablet   0   . sertraline (ZOLOFT) 50 MG tablet   Oral   Take 50 mg by mouth daily.           Allergies Codeine sulfate and Levaquin  Family History  Problem Relation Age of Onset  . Aneurysm Father   . Breast cancer Daughter     Social History Social History  Substance Use Topics  . Smoking status: Never Smoker   . Smokeless tobacco: None  . Alcohol Use: No    Review of Systems Constitutional: No fever/chills. Positive syncope. Eyes: No visual changes. No blurred or double vision. ENT: No sore throat. Cardiovascular: Denies chest pain, palpitations. Respiratory: Denies shortness of breath.  No cough. Gastrointestinal: No abdominal pain.  No nausea, no vomiting.  No diarrhea.  No constipation. Genitourinary: Negative for dysuria. Musculoskeletal: Positive for back pain. Positive for bilateral hip pain that is equal on both sides. Skin: Negative for rash. Neurological: Negative for headaches, focal weakness or numbness. No tingling.  10-point ROS otherwise negative.  ____________________________________________   PHYSICAL EXAM:  VITAL SIGNS: ED Triage Vitals  Enc Vitals Group     BP 08/09/15 1023 146/53 mmHg     Pulse Rate 08/09/15 1023 50     Resp 08/09/15 1023 18     Temp 08/09/15 1023 98.2 F (36.8 C)     Temp Source 08/09/15 1023 Oral     SpO2 08/09/15 1023 100 %     Weight 08/09/15 1023 111 lb (50.349 kg)     Height --      Head Cir --      Peak Flow --      Pain Score 08/09/15 1020 9     Pain Loc --      Pain Edu? --      Excl. in [place]? --     Constitutional: Patient is alert and oriented and answering all questions appropriately.  Eyes: Conjunctivae are normal.  EOMI. no scleral icterus. Head: Atraumatic. Nose: No congestion/rhinnorhea. Mouth/Throat: Mucous membranes are moist. No  malocclusion or dental injury. Neck: No stridor.  Supple.  JVD. Cardiovascular: slow rate between  43 and 49 while I am in the examination room with the patient, regular rhythm. No murmurs, rubs or gallops.  Respiratory: Normal respiratory effort.  No retractions. Lungs CTAB.  No wheezes, rales or ronchi. Gastrointestinal: Soft and nontender. No distention. No peritoneal signs. Musculoskeletal: Pelvis is stable. Significant pain with range of motion and tenderness to palpation over the greater trochanters bilaterally. Full range of motion without pain of the bilateral ankles knees. Normal DP and PT pulses bilaterally. No lower shotty edema. No palpable cords or tenderness to palpation in the calves. Negative Homans sign. Normal sensation to light touch throughout the bilateral lower extremities. If he is a tender to palpation in the lumbar spine from the mid lumbar spine to the sacrum without focality, step-offs or deformities. Neurologic:  Normal speech and language. No gross focal neurologic deficits are appreciated.  Skin:  Skin is warm, dry and intact. No rash noted. Psychiatric: Mood and affect are normal. Speech and behavior are normal.  Normal judgement.  ____________________________________________   LABS (all labs ordered are listed, but only abnormal results are displayed)  Labs Reviewed  URINALYSIS COMPLETEWITH MICROSCOPIC ([facility] ONLY) - Abnormal; Notable for the following:    Color, Urine STRAW (*)    APPearance CLEAR (*)    Squamous Epithelial / LPF 0-5 (*)    All other components within normal limits  CBC - Abnormal; Notable for the following:    WBC 12.3 (*)    RBC 2.75 (*)    Hemoglobin 9.8 (*)    HCT 29.7 (*)    MCV 107.9 (*)    MCH 35.7 (*)    RDW 16.5 (*)    All other components within normal limits  BASIC METABOLIC PANEL - Abnormal; Notable for the following:    Potassium 5.4 (*)    CO2 18 (*)    Glucose, Bld 103 (*)    BUN 24 (*)    Creatinine, Ser 1.06 (*)     GFR calc non Af Amer 49 (*)    GFR calc Af Amer 57 (*)    All other components within normal limits  TROPONIN I   ____________________________________________  EKG  ED ECG REPORT I, Eula Listen, the attending physician, personally viewed and interpreted this ECG.   Date: 08/09/2015  EKG Time: 1101  Rate: 48  Rhythm: Likely a junctional rhythm, as there may BP waves directly after each T-wave with a significant amount of prolongation. This is regular rhythm.  Axis: normal  Intervals:Prolonged PR  ST&T Change: No ST elevation.  ____________________________________________  RADIOLOGY  Dg Chest 2 View  08/09/2015  CLINICAL DATA:  Low back and bilateral hip pain since falling approximately 10 days ago. Syncopal episode. EXAM: CHEST  2 VIEW COMPARISON:  Portable examinations 10/15/2014 and 05/01/2014. FINDINGS: Chronic left hemidiaphragm elevation appears unchanged. There is stable mild cardiomegaly and aortic atherosclerosis. The lungs are clear. No pneumothorax or significant pleural effusion is seen. The bones appear unchanged. IMPRESSION: No acute cardiopulmonary process. Electronically Signed   By: Richardean Sale M.D.   On: 08/09/2015 11:46   Dg Lumbar Spine Complete  08/09/2015  CLINICAL DATA:  Low back and bilateral hip pain since falling 10 days ago. Syncopal episode. EXAM: LUMBAR SPINE - COMPLETE 4+ VIEW COMPARISON:  One view abdomen 04/2014.  Hip radiographs 10/12/2014. FINDINGS: The bones are demineralized. There is transitional lumbosacral anatomy with a partially lumbarized S1 segment. The alignment is near anatomic. There is a convex right scoliosis and a grade 1 anterolisthesis  at L5-S1. Mild facet degenerative changes are present throughout the lumbar spine. No acute osseous findings are seen within the spine. However, there is angulation of the anterior cortex of the upper sacrum on the lateral view which could reflect a fracture. In addition, there are fractures of  the right superior and inferior pubic rami which are not apparent on the prior radiographs. These are indeterminate in age and potentially subacute. IMPRESSION: 1. No acute osseous findings seen within the lumbar spine. 2. Age-indeterminate fractures of the sacrum and right pubic rami. Electronically Signed   By: Richardean Sale M.D.   On: 08/09/2015 11:51   Dg Hips Bilat With Pelvis 3-4 Views  08/09/2015  CLINICAL DATA:  Fall 10 days ago, bilateral hip pain EXAM: DG HIP (WITH OR WITHOUT PELVIS) 3-4V BILAT COMPARISON:  10/12/2014 and 04/28/2015. FINDINGS: Five views bilateral hip submitted. There is diffuse osteopenia. Bilateral hip prosthesis with anatomic alignment. Degenerative changes pubic symphysis. There is interval fracture deformity of the right inferior pubic ramus of indeterminate age. Clinical correlation is necessary. IMPRESSION: Diffuse osteopenia. Bilateral hip prosthesis with anatomic alignment. Interval fracture deformity of the right inferior pubic ramus of indeterminate age. Clinical correlation is necessary. Electronically Signed   By: Lahoma Crocker M.D.   On: 08/09/2015 11:50    ____________________________________________   PROCEDURES  Procedure(s) performed: None  Critical Care performed: No ____________________________________________   INITIAL IMPRESSION / ASSESSMENT AND PLAN / ED COURSE  Pertinent labs & imaging results that were available during my care of the patient were reviewed by me and considered in my medical decision making (see chart for details).  80 y.o. female with a history of A. fib here for bilateral hip pain after syncopal episode 10 days ago. I will evaluate her for her trauma of her hips and lumbar spine, but she does not have any evidence for spinal cord compression or cauda equina syndrome. I am more concerned about her bradycardia and will further evaluate her with EKG and additional electrolyte and lab  work.  ----------------------------------------- 11:17 AM on 08/09/2015 -----------------------------------------  Patient's EKG shows a regular rhythm with a likely junctional rhythm. She will need to be admitted to the hospital as this is likely the cause of her syncopal episode.  ----------------------------------------- 1:24 PM on 08/09/2015 -----------------------------------------  The patient continues to have bradycardia and junctional rhythm. She has some mild renal insufficiency, so I'll give her fluid for that. She does have a right pubic ramus fracture and a possible sacral fracture, which will be conservative management. Orthopedics is aware and can follow the patient up. I'm planning to admit the patient to the hospitalist for further evaluation of her syncope and bradycardia.  ____________________________________________  FINAL CLINICAL IMPRESSION(S) / ED DIAGNOSES  Final diagnoses:  Junctional rhythm  Syncope, unspecified syncope type  Bilateral hip pain  Midline low back pain without sciatica  Inferior pubic ramus fracture, right, closed, initial encounter ([place])  Closed fracture of sacrum, unspecified fracture morphology, initial encounter ([place])      NEW MEDICATIONS STARTED DURING THIS VISIT:  New Prescriptions   No medications on file     Eula Listen, MD 08/09/15 1325

## 2015-08-10 ENCOUNTER — Observation Stay
Admit: 2015-08-10 | Discharge: 2015-08-10 | Disposition: A | Discharge status: Home / Self Care | Payer: Medicare Other | Attending: Cardiology | Admitting: Cardiology

## 2015-08-10 DIAGNOSIS — S32501A Unspecified fracture of right pubis, initial encounter for closed fracture: Secondary | ICD-10-CM | POA: Diagnosis not present

## 2015-08-10 LAB — BASIC METABOLIC PANEL
Anion gap: 4 — ABNORMAL LOW (ref 5–15)
BUN: 18 mg/dL (ref 6–20)
CHLORIDE: 111 mmol/L (ref 101–111)
CO2: 25 mmol/L (ref 22–32)
CREATININE: 0.88 mg/dL (ref 0.44–1.00)
Calcium: 8.8 mg/dL — ABNORMAL LOW (ref 8.9–10.3)
GFR calc Af Amer: >60 mL/min (ref >60)
GFR calc non Af Amer: >60 mL/min (ref >60)
Glucose, Bld: 86 mg/dL (ref 65–99)
POTASSIUM: 4.4 mmol/L (ref 3.5–5.1)
SODIUM: 140 mmol/L (ref 135–145)

## 2015-08-10 LAB — CBC
HEMATOCRIT: 24.7 % — AB (ref 35.0–47.0)
Hemoglobin: 8.4 g/dL — ABNORMAL LOW (ref 12.0–16.0)
MCH: 37 pg — AB (ref 26.0–34.0)
MCHC: 33.9 g/dL (ref 32.0–36.0)
MCV: 109 fL — AB (ref 80.0–100.0)
PLATELETS: 147 10*3/uL — AB (ref 150–440)
RBC: 2.26 MIL/uL — ABNORMAL LOW (ref 3.80–5.20)
RDW: 15.8 % — AB (ref 11.5–14.5)
WBC: 7.7 10*3/uL (ref 3.6–11.0)

## 2015-08-10 LAB — TROPONIN I: Troponin I: 0.06 ng/mL — ABNORMAL HIGH (ref <0.031)

## 2015-08-10 LAB — HEMOGLOBIN A1C: Hgb A1c MFr Bld: 5.3 % (ref 4.0–6.0)

## 2015-08-10 NOTE — Care Management Obs Status (Signed)
[facility] NOTIFICATION   Patient Details  Name: Vickie Howard MRN: [id number] Date of Birth: [birth year]   Medicare Observation Status Notification Given:  Yes    Katrina Stack, RN 08/10/2015, 8:39 AM

## 2015-08-10 NOTE — Consult Note (Signed)
[facility]  CARDIOLOGY CONSULT NOTE  Patient ID: Vickie Howard MRN: [id number] DOB/AGE: [birth year] 80 y.o.  Admit date: 08/09/2015 Referring Physician Dr. Ether Griffins Primary Physician   Primary Cardiologist Dr. Clayborn Bigness Reason for Consultation bradycardia  HPI: Patient is a 80 year old female with history of atrial fibrillation, hypertension and hyperlipidemia. She had been treated with digoxin and Cardizem. Approximate 10 days ago he had an apparent syncopal episode. She did not seek immediate medical attention. She began noting hip and pelvic pain when ambulating and presented to the emergency room where she was noted to have evidence of a fracture of the right inferior pubic ramus.. She was also noted to be bradycardic. She has had some lightheadedness but no other syncopal episodes. She had evidence of volume depletion with relative hyperkalemia. BUN and creatinine were 24 and 1.06. 9 months ago her creatinine was normal at 0.7. TFR is 49. She was taken off of her AV nodal related medications including digoxin and Cardizem. A non-trough  dig level was 1.7. She denies chest pain. She complains of hip and leg pain when ambulating.  Review of Systems  Constitutional: Negative.   HENT: Negative.   Eyes: Negative.   Respiratory: Negative.   Cardiovascular: Negative.   Gastrointestinal: Negative.   Genitourinary: Negative.   Musculoskeletal: Positive for joint pain and falls.  Skin: Negative.   Neurological: Positive for dizziness and loss of consciousness.  Endo/Heme/Allergies: Negative.   Psychiatric/Behavioral: Negative.     Past Medical History  Diagnosis Date  . Heart murmur   . History of hiatal hernia   . Arthritis   . Occasional tremors     hands  . Atrial fibrillation ([place])   . HLD (hyperlipidemia)   . HTN (hypertension)   . GERD (gastroesophageal reflux disease)     Family History  Problem Relation Age of Onset  . Aneurysm  Father   . Breast cancer Daughter     Social History   Social History  . Marital Status: Widowed    Spouse Name: N/A  . Number of Children: N/A  . Years of Education: N/A   Occupational History  . Not on file.   Social History Main Topics  . Smoking status: Never Smoker   . Smokeless tobacco: Not on file  . Alcohol Use: No  . Drug Use: No  . Sexual Activity: No   Other Topics Concern  . Not on file   Social History Narrative    Past Surgical History  Procedure Laterality Date  . Tubal ligation    . Joint replacement      right total hip 2015  . Left total hip arthroplasty  10/12/2014  . Total hip arthroplasty Left 10/12/2014    Procedure: TOTAL HIP ARTHROPLASTY;  Surgeon: Dereck Leep, MD;  Location: [facility] ORS;  Service: Orthopedics;  Laterality: Left;     Prescriptions prior to admission  Medication Sig Dispense Refill Last Dose  . acetaminophen (TYLENOL) 500 MG tablet Take 1,000 mg by mouth every 6 (six) hours as needed for mild pain or headache.   08/09/2015 at 0730  . aspirin EC 81 MG tablet Take 81 mg by mouth daily.   08/09/2015 at 0730  . atorvastatin (LIPITOR) 20 MG tablet Take 20 mg by mouth daily.   08/09/2015 at Unknown time  . cyanocobalamin (,VITAMIN B-12,) 1000 MCG/ML injection Inject 1,000 mcg into the muscle every 30 (thirty) days.   08/09/2015 at Unknown time  . digoxin (LANOXIN)  0.125 MG tablet Take 0.125 mg by mouth daily.   08/09/2015 at Unknown time  . diltiazem (CARDIZEM) 90 MG tablet Take 90 mg by mouth 4 (four) times daily.   08/09/2015 at Unknown time  . docusate sodium (COLACE) 100 MG capsule Take 100 mg by mouth 2 (two) times daily.   08/09/2015 at Unknown time  . ferrous sulfate 325 (65 FE) MG tablet Take 325 mg by mouth daily with breakfast.   08/09/2015 at Unknown time  . gabapentin (NEURONTIN) 100 MG capsule Take 100 mg by mouth daily.    08/09/2015 at Unknown time  . Multiple Vitamins-Minerals (PRESERVISION AREDS 2) CAPS Take 1 capsule by mouth daily.    08/09/2015 at Unknown time  . omeprazole (PRILOSEC) 20 MG capsule Take 20 mg by mouth daily.   08/09/2015 at Unknown time  . potassium chloride SA (K-DUR,KLOR-CON) 20 MEQ tablet Take 40 mEq by mouth 2 (two) times daily.   08/09/2015 at Unknown time  . sertraline (ZOLOFT) 50 MG tablet Take 50 mg by mouth daily.   08/09/2015 at Unknown time    Physical Exam: Blood pressure 162/49, pulse 52, temperature 97.9 F (36.6 C), temperature source Oral, resp. rate 20, height 4\' 11"  (1.499 m), weight 50.531 kg (111 lb 6.4 oz), SpO2 97 %.   Wt Readings from Last 1 Encounters:  08/09/15 50.531 kg (111 lb 6.4 oz)     General appearance: alert and cooperative Head: Normocephalic, without obvious abnormality, atraumatic Resp: clear to auscultation bilaterally Chest wall: no tenderness Cardio: Bradycardia with 1 to 2/6 systolic murmur GI: soft, non-tender; bowel sounds normal; no masses,  no organomegaly Pulses: 2+ and symmetric Neurologic: Grossly normal  Labs:   Lab Results  Component Value Date   WBC 7.7 08/10/2015   HGB 8.4* 08/10/2015   HCT 24.7* 08/10/2015   MCV 109.0* 08/10/2015   PLT 147* 08/10/2015    Recent Labs Lab 08/10/15 0520  NA 140  K 4.4  CL 111  CO2 25  BUN 18  CREATININE 0.88  CALCIUM 8.8*  GLUCOSE 86   Lab Results  Component Value Date   TROPONINI 0.06* 08/10/2015      Radiology: Pubic ramus fracture EKG: Junctional rhythm with sinus bradycardia with rates of high 30s to mid 40s.  ASSESSMENT AND PLAN:  80 year old female with a pubic ramus fracture and evidence of bradycardia. Patient was on Cardizem and digoxin presented after suffering a probable syncopal episode resulting in a pubic ramus fracture 10 days ago. She was noted to have heart rates in the upper 30s to lower 40s with junctional intermittent leave with sinus bradycardia. She was taken off digoxin and Cardizem. Her dig level was 1.7 however this was a non-trough level. Renal function was slightly worse due  to probable dehydration. We'll need to hold her AV nodal related medications and follow heart rate. If she still has symptomatic bradycardia, consideration for permanent pacemaker will need to be raised. Signed: Teodoro Spray MD, [facility] 08/10/2015, 9:42 AM

## 2015-08-10 NOTE — Progress Notes (Signed)
Call from central telemetry saying that patient HR in low 30s. Patient has been sustaining in upper 30s-50s all day. Dr. Gladstone Lighter and Ubaldo Glassing paged and made aware. Patient has NPO after MN order in, in the event that she will have a pacemaker placed tomorrow. Patient's BP stable and she is awake and sitting in chair, in NAD. Will continue to monitor.

## 2015-08-10 NOTE — Care Management (Signed)
Patient presented to the ED from home with hip pain.  She was noted to have significant bradycardia.  Her cardizem and digoxin is on hold. Patient at present is not complaining of symptoms. Spoke with patient in the presence of her daughter Jacqulyn Liner.  Patient lives in a [place] and does have life alert.  The fall in which she sustained the pubic fracture occurred 10 days ago.  It is reported as a syncopal event. Patient did not volunteer information that ems was called and she refused transport.  Patient bathes at the sink and fixes her own meals.  Says she has a walker.  Daughter verbalizes concerns of patient safety living alone.  Patient says she wants to stay in her own home.  She says "I have the right to make my own decisions.  I do not want to be a burden to my family but I have the right." Discussed physical therapy recommendations of home with home health.  Discussed that could anticipate discharge home 3.8 unless there were continued concerns regarding cardiac status. Patty asked to speak with CM out of room without patient.  She says that the patient never leaves her home anymore, does not go to church, will not go on outings and she does not take her antidepressants, she does not eat right and gets dehydrated.  Describes frequent admissions to hospital with dehydration .  Review of previous Allscripts and Epic admits do not reveal frequent admissions for dehydration.  Had a previous admit 10/2014 related to a fib.  Allscripts admits related to a fib and ortho surgery .  Patient does not have frequent presentations to this hospital on record.   Discussed home health services at discharge and agreeable  but Chong Sicilian feels that patient should not be living alone. Family has been looking at assisted living communities but patient is very firm in her desire to stay in her home.  Patient may benefit from some in home companion services.   Patient may also do well at retirement communities such as [facility] and  [facility]. Provided written resources.  Chong Sicilian says that patient is having a pacemaker insertion 08/11/2015.  At present, there is not documentation stating this is a definite plan but is very possible.  Discussed with Patty if this surgical procedure did occur, patient may have a decline in her functional status and physical therapy recommendations may be different. Patient would be reassessed.

## 2015-08-10 NOTE — Evaluation (Signed)
Physical Therapy Evaluation Patient Details Name: Vickie Howard MRN: [id number] DOB: 02/07/1936 Today's Date: 08/10/2015   History of Present Illness  Pt is a 80 y.o. female with PMH of A-Fib, HTN, and heart murmur.  Pt presented with B hip pain and back pain and difficulty walking s/p a fall 10 days ago. Pt was admitted for sacral fx, R inferior pubic ramus fx, bradycardia and acute renal insufficiency.       Clinical Impression  Prior to admission pt was independent with RW.  Pt lives alone.  Pt's initial heart rate was 41 bpm in supine.  Pt was supervision for bed mobility to sitting EOB.  Pt's HR sitting EOB was 62 bpm.  Pt complained of 6/10 pain sitting EOB.  Pt was CGA for sit to stand with RW for 2 sets.  Pt was CGA for ambulation for 50 feet with RW.  Pt did not have complaints of dizziness or other adverse symptoms during transfers and ambulation.  Pt's HR during ambulation was 74 bpm.  Pt's HR at end of session sitting in chair was 41 bpm.  Due to aforementioned function and strength deficits, pt is in need of skilled physical therapy.  It is recommended that pt is discharged to home with familial support and home health PT when medically appropriate.     Follow Up Recommendations Home health PT    Equipment Recommendations       Recommendations for Other Services       Precautions / Restrictions Precautions Precautions: Fall Restrictions Weight Bearing Restrictions: Yes Other Position/Activity Restrictions: WBAT       Mobility  Bed Mobility Overal bed mobility: Needs Assistance Bed Mobility: Rolling;Sidelying to Sit Rolling: Supervision Sidelying to sit: Supervision          Transfers Overall transfer level: Needs assistance Equipment used: Rolling walker (2 wheeled) Transfers: Sit to/from [employer] sets) Sit to Stand: Min guard            Ambulation/Gait Ambulation/Gait assistance: Min guard Ambulation Distance (Feet): 50 Feet Assistive device:  Rolling walker (2 wheeled) Gait Pattern/deviations: Step-through pattern;Decreased step length - right;Decreased step length - left;Trunk flexed Gait velocity: decreased      Stairs            Wheelchair Mobility    Modified Rankin (Stroke Patients Only)       Balance Overall balance assessment: Needs assistance Sitting-balance support: Feet supported Sitting balance-Leahy Scale: Good     Standing balance support: Bilateral upper extremity supported (RW) Standing balance-Leahy Scale: Good                               Pertinent Vitals/Pain Pain Assessment: 0-10 Pain Score: 6  Pain Location: hip and lower back Pain Descriptors / Indicators: Aching;Grimacing;Discomfort Pain Intervention(s): Limited activity within patient's tolerance;Monitored during session;Premedicated before session;Repositioned (Nursing notified of pain after session)    Home Living Family/patient expects to be discharged to:: Private residence Living Arrangements: Alone Available Help at Discharge: Family Type of Home: Apartment Home Access: Level entry     Home Layout: One level Home Equipment: Environmental consultant - 2 wheels      Prior Function Level of Independence: Independent with assistive device(s)         Comments: utilized RW with mobility prior to admittance     Hand Dominance        Extremity/Trunk Assessment   Upper Extremity Assessment: Overall WFL for tasks  assessed           Lower Extremity Assessment: Generalized weakness      Cervical / Trunk Assessment: Kyphotic  Communication   Communication: No difficulties  Cognition Arousal/Alertness: Awake/alert Behavior During Therapy: WFL for tasks assessed/performed Overall Cognitive Status: Within Functional Limits for tasks assessed                      General Comments   Nursing was contacted and cleared pt for physical therapy.  Dr. Ubaldo Glassing was contacted and cleared pt for PT and for ambulation  (Dr. Ubaldo Glassing stated that increased troponin was not an MI event and wanted to see HR response with acitivity).  Pt was agreeable and tolerated session fairly well.  Pt's son was present during session.  Nursing was notified of pain in hips and low back and of HR during session.     Exercises        Assessment/Plan    PT Assessment Patient needs continued PT services  PT Diagnosis Generalized weakness   PT Problem List Decreased strength;Decreased activity tolerance;Decreased balance;Decreased mobility;Decreased knowledge of use of DME  PT Treatment Interventions DME instruction;Gait training;Functional mobility training;Therapeutic activities;Therapeutic exercise;Patient/family education;Balance training   PT Goals (Current goals can be found in the Care Plan section) Acute Rehab PT Goals Patient Stated Goal: to go home PT Goal Formulation: With patient Time For Goal Achievement: 08/24/15 Potential to Achieve Goals: Good    Frequency 7X/week   Barriers to discharge        Co-evaluation               End of Session Equipment Utilized During Treatment: Gait belt Activity Tolerance: Patient tolerated treatment well Patient left: in chair;with call bell/phone within reach;with chair alarm set;with family/visitor present Nurse Communication: Mobility status         Time: [id number] PT Time Calculation (min) (ACUTE ONLY): 34 min   Charges:         PT G Codes:       Mittie Bodo, SPT Mittie Bodo 08/10/2015, 4:38 PM

## 2015-08-10 NOTE — Progress Notes (Signed)
Patient's HR dropped to 35. Troponin 0.06. Dr. pyreddy called. Monitor patient and hold beta blockers for now.

## 2015-08-10 NOTE — Progress Notes (Addendum)
Patient still reporting 6/10 hip pain and headache. Patient is not able to take tylenol or tramadol yet. RN offered to speak with MD about another pain medication option to help control it better, but patient declined this offer. Patient states she would prefer to wait a little longer. Family at bedside aware and is in agreement with patient. RN told patient to let her know if her pain becomes too much and she feels she needs something stronger. RN will be assessing pain frequently and spoke with patient about pain management as a priority today.

## 2015-08-10 NOTE — Progress Notes (Signed)
*  PRELIMINARY RESULTS* Echocardiogram 2D Echocardiogram has been performed.  [name] 08/10/2015, 10:42 AM

## 2015-08-10 NOTE — Progress Notes (Signed)
[facility] [place] NAME: Vickie Howard    MR#:  [id number]  DATE OF BIRTH:  Nov 02, 1935  SUBJECTIVE:   Patient here due to a fall 10 days ago and having significant hip pain and noted to have a pelvic fracture. Patient also noted to be severely bradycardic with heart rates in the 40s to 30s with a junctional rhythm. Daughter at bedside.  Still has significant Hip pain.  HR still low but hemodynamically stable.    REVIEW OF SYSTEMS:    Review of Systems  Constitutional: Negative for fever and chills.  HENT: Negative for congestion and tinnitus.   Eyes: Negative for blurred vision and double vision.  Respiratory: Negative for cough, shortness of breath and wheezing.   Cardiovascular: Negative for chest pain, orthopnea and PND.  Gastrointestinal: Negative for nausea, vomiting, abdominal pain and diarrhea.  Genitourinary: Negative for dysuria and hematuria.  Musculoskeletal: Positive for joint pain (bilateral hip).  Neurological: Positive for weakness. Negative for dizziness, sensory change and focal weakness.  All other systems reviewed and are negative.   Nutrition: Heart Healthy Tolerating Diet: yes Tolerating PT: Await Eval.    DRUG ALLERGIES:   Allergies  Allergen Reactions  . Codeine Sulfate Other (See Comments)    Reaction:  Syncope  . Levaquin [Levofloxacin] Nausea And Vomiting  . Oxycodone Other (See Comments)    Reaction:  Disoriented     VITALS:  Blood pressure 154/41, pulse 43, temperature 97.9 F (36.6 C), temperature source Oral, resp. rate 16, height 4\' 11"  (1.499 m), weight 50.531 kg (111 lb 6.4 oz), SpO2 95 %.  PHYSICAL EXAMINATION:   Physical Exam  GENERAL:  80 y.o.-year-old patient lying in the bed with no acute distress.  EYES: Pupils equal, round, reactive to light and accommodation. No scleral icterus. Extraocular muscles intact.  HEENT: Head atraumatic, normocephalic. Oropharynx and  nasopharynx clear.  NECK:  Supple, no jugular venous distention. No thyroid enlargement, no tenderness.  LUNGS: Normal breath sounds bilaterally, no wheezing, rales, rhonchi. No use of accessory muscles of respiration.  CARDIOVASCULAR: S1, S2 normal. II/VI SEM at base, No rubs, or gallops.  ABDOMEN: Soft, nontender, nondistended. Bowel sounds present. No organomegaly or mass.  EXTREMITIES: No cyanosis, clubbing or edema b/l.    NEUROLOGIC: Cranial nerves II through XII are intact. No focal Motor or sensory deficits b/l. Globally weak.    PSYCHIATRIC: The patient is alert and oriented x 3.  SKIN: No obvious rash, lesion, or ulcer.    LABORATORY PANEL:   CBC  Recent Labs Lab 08/10/15 0520  WBC 7.7  HGB 8.4*  HCT 24.7*  PLT 147*   ------------------------------------------------------------------------------------------------------------------  Chemistries   Recent Labs Lab 08/10/15 0520  NA 140  K 4.4  CL 111  CO2 25  GLUCOSE 86  BUN 18  CREATININE 0.88  CALCIUM 8.8*   ------------------------------------------------------------------------------------------------------------------  Cardiac Enzymes  Recent Labs Lab 08/10/15 0520  TROPONINI 0.06*   ------------------------------------------------------------------------------------------------------------------  RADIOLOGY:  Dg Chest 2 View  08/09/2015  CLINICAL DATA:  Low back and bilateral hip pain since falling approximately 10 days ago. Syncopal episode. EXAM: CHEST  2 VIEW COMPARISON:  Portable examinations 10/15/2014 and 05/01/2014. FINDINGS: Chronic left hemidiaphragm elevation appears unchanged. There is stable mild cardiomegaly and aortic atherosclerosis. The lungs are clear. No pneumothorax or significant pleural effusion is seen. The bones appear unchanged. IMPRESSION: No acute cardiopulmonary process. Electronically Signed   By: Richardean Sale M.D.   On: 08/09/2015 11:46  Dg Lumbar Spine  Complete  08/09/2015  CLINICAL DATA:  Low back and bilateral hip pain since falling 10 days ago. Syncopal episode. EXAM: LUMBAR SPINE - COMPLETE 4+ VIEW COMPARISON:  One view abdomen 04/2014.  Hip radiographs 10/12/2014. FINDINGS: The bones are demineralized. There is transitional lumbosacral anatomy with a partially lumbarized S1 segment. The alignment is near anatomic. There is a convex right scoliosis and a grade 1 anterolisthesis at L5-S1. Mild facet degenerative changes are present throughout the lumbar spine. No acute osseous findings are seen within the spine. However, there is angulation of the anterior cortex of the upper sacrum on the lateral view which could reflect a fracture. In addition, there are fractures of the right superior and inferior pubic rami which are not apparent on the prior radiographs. These are indeterminate in age and potentially subacute. IMPRESSION: 1. No acute osseous findings seen within the lumbar spine. 2. Age-indeterminate fractures of the sacrum and right pubic rami. Electronically Signed   By: Richardean Sale M.D.   On: 08/09/2015 11:51   Dg Hips Bilat With Pelvis 3-4 Views  08/09/2015  CLINICAL DATA:  Fall 10 days ago, bilateral hip pain EXAM: DG HIP (WITH OR WITHOUT PELVIS) 3-4V BILAT COMPARISON:  10/12/2014 and 04/28/2015. FINDINGS: Five views bilateral hip submitted. There is diffuse osteopenia. Bilateral hip prosthesis with anatomic alignment. Degenerative changes pubic symphysis. There is interval fracture deformity of the right inferior pubic ramus of indeterminate age. Clinical correlation is necessary. IMPRESSION: Diffuse osteopenia. Bilateral hip prosthesis with anatomic alignment. Interval fracture deformity of the right inferior pubic ramus of indeterminate age. Clinical correlation is necessary. Electronically Signed   By: Lahoma Crocker M.D.   On: 08/09/2015 11:50     ASSESSMENT AND PLAN:   80 year old female with past history of osteoporosis, history of  previous hip surgery, chronic atrial fibrillation, hypertension, hyperlipidemia, GERD who presented to the hospital due to bilateral hip pain and that noted to have a pubic rami fracture and also noted to be severely bradycardic.  #1 bradycardia/junctional rhythms-patient was on high doses of Cardizem, digoxin. Both have been discontinued. -Heart rate is still low but patient is hemodynamically stable. Patient was seen by cardiology and recommended holding medications for now. If she develops worsening lower heart rates or hemodynamic instability then she may need a pacemaker. - await Echo results.   #2 acute renal failure-improved with IV fluids. -We'll follow BUN and creatinine.  #3 hyperkalemia-potassium normalized.  #4 status post fall and pubic rami fracture-nonoperable. -Continue supportive care with pain control. Await physical therapy evaluation.  #5 Depression - cont. Zoloft.   #6.  Neuropathy - cont. Neurontin.   #7  GERD - cont. Protonix.    All the records are reviewed and case discussed with Care Management/Social Workerr. Management plans discussed with the patient, family and they are in agreement.  CODE STATUS: Full Code  DVT Prophylaxis: Lovenox  TOTAL TIME TAKING CARE OF THIS PATIENT: 30 minutes.   POSSIBLE D/C IN 1-2 DAYS, DEPENDING ON CLINICAL CONDITION.   Henreitta Leber M.D on 08/10/2015 at 1:50 PM  Between 7am to 6pm - Pager - [phone]  After 6pm go to [email or web address] - password [email or web address] [facility]  [facility]  Office  [phone]  CC: Primary care physician; Adin Hector, MD

## 2015-08-11 ENCOUNTER — Observation Stay: Payer: Medicare Other

## 2015-08-11 DIAGNOSIS — S32501A Unspecified fracture of right pubis, initial encounter for closed fracture: Secondary | ICD-10-CM | POA: Diagnosis not present

## 2015-08-11 LAB — BASIC METABOLIC PANEL
ANION GAP: 3 — AB (ref 5–15)
BUN: 18 mg/dL (ref 6–20)
CHLORIDE: 109 mmol/L (ref 101–111)
CO2: 28 mmol/L (ref 22–32)
Calcium: 9 mg/dL (ref 8.9–10.3)
Creatinine, Ser: 0.87 mg/dL (ref 0.44–1.00)
GFR calc Af Amer: >60 mL/min (ref >60)
Glucose, Bld: 85 mg/dL (ref 65–99)
POTASSIUM: 4.3 mmol/L (ref 3.5–5.1)
SODIUM: 140 mmol/L (ref 135–145)

## 2015-08-11 LAB — CBC
HCT: 24.1 % — ABNORMAL LOW (ref 35.0–47.0)
HEMOGLOBIN: 8.3 g/dL — AB (ref 12.0–16.0)
MCH: 36.5 pg — AB (ref 26.0–34.0)
MCHC: 34.4 g/dL (ref 32.0–36.0)
MCV: 106 fL — AB (ref 80.0–100.0)
Platelets: 145 10*3/uL — ABNORMAL LOW (ref 150–440)
RBC: 2.27 MIL/uL — AB (ref 3.80–5.20)
RDW: 15.9 % — ABNORMAL HIGH (ref 11.5–14.5)
WBC: 7.4 10*3/uL (ref 3.6–11.0)

## 2015-08-11 MED ORDER — SODIUM CHLORIDE 0.9 % IV SOLN
INTRAVENOUS | Status: DC
  Administered 2015-08-12: 09:00:00 via INTRAVENOUS

## 2015-08-11 MED ORDER — ENOXAPARIN SODIUM 40 MG/0.4ML ~~LOC~~ SOLN
40.0000 mg | SUBCUTANEOUS | Status: DC
  Administered 2015-08-11: 40 mg via SUBCUTANEOUS
  Filled 2015-08-11: qty 0.4

## 2015-08-11 MED ORDER — CHLORHEXIDINE GLUCONATE 4 % EX LIQD
60.0000 mL | Freq: Once | CUTANEOUS | Status: AC
  Administered 2015-08-12: 4 via TOPICAL

## 2015-08-11 MED ORDER — CEFAZOLIN SODIUM-DEXTROSE 2-3 GM-% IV SOLR
2.0000 g | INTRAVENOUS | Status: DC
Start: 2015-08-11 — End: 2015-08-12
  Filled 2015-08-11: qty 50

## 2015-08-11 MED ORDER — CHLORHEXIDINE GLUCONATE 4 % EX LIQD
60.0000 mL | Freq: Once | CUTANEOUS | Status: AC
  Administered 2015-08-11: 4 via TOPICAL

## 2015-08-11 NOTE — Progress Notes (Signed)
Physical Therapy Treatment Patient Details Name: SHERIKA SCHROM MRN: [id number] DOB: [birth year] Today's Date: 08/11/2015    History of Present Illness Pt is a 80 y.o. female with PMH of A-Fib, HTN, and heart murmur.  Pt presented with B hip pain and back pain and difficulty walking s/p a fall 10 days ago. Pt was admitted for sacral fx, R inferior pubic ramus fx, bradycardia and acute renal insufficiency.      PT Comments    Pt's HR at beginning of session was 41 bpm.  Pt was supervision during bed mobility.  Pt's HR sitting EOB was 65 bpm.  Pt was CGA for sit to stand and stand pivot transfer both with RW.  Pt's HR was 72 bpm after stand pivot transfer.  Pt performed in bed and sitting in chair exercises. Pt's HR at end of session was 40 bpm.  Nursing notified of pt's HR during session.  Pt is planned to receive a pacemaker tomorrow.   Follow Up Recommendations  Home health PT     Equipment Recommendations       Recommendations for Other Services       Precautions / Restrictions Precautions Precautions: Fall Restrictions Weight Bearing Restrictions: Yes Other Position/Activity Restrictions: WBAT     Mobility  Bed Mobility Overal bed mobility: Needs Assistance Bed Mobility: Rolling;Sidelying to Sit Rolling: Supervision Sidelying to sit: Supervision       General bed mobility comments: Required extra time   Transfers Overall transfer level: Needs assistance Equipment used: Rolling walker (2 wheeled) Transfers: Sit to/from [employer] Sit to Stand: Min guard Stand pivot transfers: Min guard       General transfer comment: required extra time, trunk flexed .  Required occasional VC and tactile cues for walker placement.  Ambulation/Gait                 Stairs            Wheelchair Mobility    Modified Rankin (Stroke Patients Only)       Balance Overall balance assessment: Needs assistance Sitting-balance support: Feet  supported Sitting balance-Leahy Scale: Good     Standing balance support: Bilateral upper extremity supported (RW ) Standing balance-Leahy Scale: Good                      Cognition Arousal/Alertness: Awake/alert Behavior During Therapy: WFL for tasks assessed/performed Overall Cognitive Status: Within Functional Limits for tasks assessed                      Exercises Total Joint Exercises Ankle Circles/Pumps: AROM;Both (2 sets of 20 ) Gluteal Sets: AROM;Both;10 reps (Painfull during exercise ) Heel Slides: AROM;Both;20 reps (2x10) Hip ABduction/ADduction: AROM;Both;20 reps (2x10) Straight Leg Raises: AROM;Both;10 reps (6 inch raise) Long Arc Quad: AROM;Both;10 reps (Painful during exercise ) General Exercises - Upper Extremity Shoulder Flexion: AROM;Both;20 reps (2x10) Elbow Flexion: AROM;Both;20 reps (2x10)  General Exercises - Lower Extremity Hip ABduction/ADduction: AROM;Both;20 reps (adduction squeezes into pillow 2x10)  Long arc quad exercise was performed sitting in chair.  All other exercises were performed supine in bed.    General Comments   Nursing was contacted and cleared pt for physical therapy.  Pt was agreeable and tolerated session well. Straight leg raise (supine in bed), gluteal squeezes (supine in bed) and long arc quad (sitting in chair) were painful for patient (pain:6/10).   Nursing was notified of pt in chair after session.  Pt's  daughter was present at the beginning of the session.       Pertinent Vitals/Pain Pain Assessment: 0-10 Pain Score: 7  Pain Location: Hips and lower back  Pain Descriptors / Indicators: Aching;Grimacing;Discomfort Pain Intervention(s): Limited activity within patient's tolerance;Monitored during session;Repositioned  See flow sheet for vitals.     Home Living                      Prior Function            PT Goals (current goals can now be found in the care plan section) Acute Rehab PT  Goals Patient Stated Goal: to go home PT Goal Formulation: With patient Time For Goal Achievement: 08/24/15 Potential to Achieve Goals: Good Additional Goals Additional Goal #1: Pt will have appropriate HR repsonse during activity Progress towards PT goals: Progressing toward goals    Frequency  7X/week    PT Plan Current plan remains appropriate    Co-evaluation             End of Session Equipment Utilized During Treatment: Gait belt Activity Tolerance: Patient tolerated treatment well Patient left: in chair;with call bell/phone within reach;with chair alarm set     Time: [id number] PT Time Calculation (min) (ACUTE ONLY): 24 min  Charges:                       G Codes:      Mittie Bodo, SPT Mittie Bodo 08/11/2015, 4:29 PM

## 2015-08-11 NOTE — Progress Notes (Signed)
[place] [facility] [place] NAME: Vickie Howard    MR#:  [id number]  DATE OF BIRTH:  25-Apr-1936  SUBJECTIVE:   Patient doing well this morning. She denies chest pain or lightheadedness. She occasionally has fatigue and shortness of breath. REVIEW OF SYSTEMS:    Review of Systems  Constitutional: Negative for fever and chills.  HENT: Negative for congestion and tinnitus.   Eyes: Negative for blurred vision and double vision.  Respiratory: Negative for cough, shortness of breath and wheezing.   Cardiovascular: Negative for chest pain, orthopnea and PND.  Gastrointestinal: Negative for nausea, vomiting, abdominal pain and diarrhea.  Genitourinary: Negative for dysuria and hematuria.  Neurological: Positive for weakness. Negative for dizziness, sensory change and focal weakness.  All other systems reviewed and are negative.   Nutrition: Heart Healthy Tolerating Diet: yes Tolerating PT: Home health PT  DRUG ALLERGIES:   Allergies  Allergen Reactions  . Codeine Sulfate Other (See Comments)    Reaction:  Syncope  . Levaquin [Levofloxacin] Nausea And Vomiting  . Oxycodone Other (See Comments)    Reaction:  Disoriented     VITALS:  Blood pressure 149/37, pulse 40, temperature 98.3 F (36.8 C), temperature source Oral, resp. rate 16, height 4\' 11"  (1.499 m), weight 50.531 kg (111 lb 6.4 oz), SpO2 96 %.  PHYSICAL EXAMINATION:   Physical Exam  Constitutional: She is oriented to person, place, and time and well-developed, well-nourished, and in no distress. No distress.  HENT:  Head: Normocephalic.  Eyes: No scleral icterus.  Neck: Normal range of motion. Neck supple. No JVD present. No tracheal deviation present.  Cardiovascular: Regular rhythm and normal heart sounds.  Exam reveals no gallop and no friction rub.   No murmur heard. Bradycardia  Pulmonary/Chest: Effort normal and breath sounds normal. No respiratory distress. She  has no wheezes. She has no rales. She exhibits no tenderness.  Abdominal: Soft. Bowel sounds are normal. She exhibits no distension and no mass. There is no tenderness. There is no rebound and no guarding.  Musculoskeletal: Normal range of motion. She exhibits no edema.  Neurological: She is alert and oriented to person, place, and time.  Skin: Skin is warm. No rash noted. No erythema.  Psychiatric: Affect and judgment normal.      LABORATORY PANEL:   CBC  Recent Labs Lab 08/11/15 0404  WBC 7.4  HGB 8.3*  HCT 24.1*  PLT 145*   ------------------------------------------------------------------------------------------------------------------  Chemistries   Recent Labs Lab 08/11/15 0404  NA 140  K 4.3  CL 109  CO2 28  GLUCOSE 85  BUN 18  CREATININE 0.87  CALCIUM 9.0   ------------------------------------------------------------------------------------------------------------------  Cardiac Enzymes  Recent Labs Lab 08/10/15 0520  TROPONINI 0.06*   ------------------------------------------------------------------------------------------------------------------  RADIOLOGY:  Dg Chest 2 View  08/09/2015  CLINICAL DATA:  Low back and bilateral hip pain since falling approximately 10 days ago. Syncopal episode. EXAM: CHEST  2 VIEW COMPARISON:  Portable examinations 10/15/2014 and 05/01/2014. FINDINGS: Chronic left hemidiaphragm elevation appears unchanged. There is stable mild cardiomegaly and aortic atherosclerosis. The lungs are clear. No pneumothorax or significant pleural effusion is seen. The bones appear unchanged. IMPRESSION: No acute cardiopulmonary process. Electronically Signed   By: Richardean Sale M.D.   On: 08/09/2015 11:46   Dg Lumbar Spine Complete  08/09/2015  CLINICAL DATA:  Low back and bilateral hip pain since falling 10 days ago. Syncopal episode. EXAM: LUMBAR SPINE - COMPLETE 4+ VIEW COMPARISON:  One view  abdomen 04/2014.  Hip radiographs 10/12/2014.  FINDINGS: The bones are demineralized. There is transitional lumbosacral anatomy with a partially lumbarized S1 segment. The alignment is near anatomic. There is a convex right scoliosis and a grade 1 anterolisthesis at L5-S1. Mild facet degenerative changes are present throughout the lumbar spine. No acute osseous findings are seen within the spine. However, there is angulation of the anterior cortex of the upper sacrum on the lateral view which could reflect a fracture. In addition, there are fractures of the right superior and inferior pubic rami which are not apparent on the prior radiographs. These are indeterminate in age and potentially subacute. IMPRESSION: 1. No acute osseous findings seen within the lumbar spine. 2. Age-indeterminate fractures of the sacrum and right pubic rami. Electronically Signed   By: Richardean Sale M.D.   On: 08/09/2015 11:51   Dg Hips Bilat With Pelvis 3-4 Views  08/09/2015  CLINICAL DATA:  Fall 10 days ago, bilateral hip pain EXAM: DG HIP (WITH OR WITHOUT PELVIS) 3-4V BILAT COMPARISON:  10/12/2014 and 04/28/2015. FINDINGS: Five views bilateral hip submitted. There is diffuse osteopenia. Bilateral hip prosthesis with anatomic alignment. Degenerative changes pubic symphysis. There is interval fracture deformity of the right inferior pubic ramus of indeterminate age. Clinical correlation is necessary. IMPRESSION: Diffuse osteopenia. Bilateral hip prosthesis with anatomic alignment. Interval fracture deformity of the right inferior pubic ramus of indeterminate age. Clinical correlation is necessary. Electronically Signed   By: Lahoma Crocker M.D.   On: 08/09/2015 11:50     ASSESSMENT AND PLAN:   80 year old female with past history of osteoporosis, history of previous hip surgery, chronic atrial fibrillation, hypertension, hyperlipidemia, GERD who presented to the hospital due to bilateral hip pain and that noted to have a pubic rami fracture and also noted to be severely  bradycardic.  1 bradycardia/junctional rhythms" She was on high doses of Cardizem and digoxin, both of which have been discontinued.  She continues to have bradycardia. Plan for pacemaker tomorrow. Echo shows normal ejection fraction.   2 acute renal failure: improved with IV fluids.   3 hyperkalemia: Resolved.  4 status post fall and pubic rami fracture: This is nonoperable. Continue supportive care with pain control.  Physical therapy recommends home with home health at discharge.   5 Depression: Continue Zoloft   6. Neuropathy: Continue Neurontin.   7  GERD : Continue Protonix.     Management plans discussed with the patient and family and they are in agreement.  CODE STATUS: Full Code  DVT Prophylaxis: Lovenox  TOTAL TIME TAKING CARE OF THIS PATIENT: 28 minutes.   POSSIBLE D/C IN 1-2 DAYS, DEPENDING ON CLINICAL CONDITION.   Desirie Minteer M.D on 08/11/2015 at 11:26 AM  Between 7am to 6pm - Pager - [phone]  After 6pm go to [email or web address] - password [email or web address] [facility]  [place] [facility]  Office  [phone]  CC: Primary care physician; Adin Hector, MD

## 2015-08-11 NOTE — Progress Notes (Signed)
[place]  SUBJECTIVE: Hip pain   Filed Vitals:   08/10/15 2032 08/10/15 2136 08/11/15 0349 08/11/15 0829  BP: 163/48 150/48 153/46 153/51  Pulse: 48 41 48 53  Temp:   98.3 F (36.8 C) 98.1 F (36.7 C)  TempSrc:   Oral Oral  Resp:   16 18  Height:      Weight:      SpO2: 97%  95% 98%    Intake/Output Summary (Last 24 hours) at 08/11/15 0858 Last data filed at 08/11/15 0800  Gross per 24 hour  Intake    660 ml  Output   2500 ml  Net  -1840 ml    LABS: Basic Metabolic Panel:  Recent Labs  08/10/15 0520 08/11/15 0404  NA 140 140  K 4.4 4.3  CL 111 109  CO2 25 28  GLUCOSE 86 85  BUN 18 18  CREATININE 0.88 0.87  CALCIUM 8.8* 9.0   Liver Function Tests: No results for input(s): AST, ALT, ALKPHOS, BILITOT, PROT, ALBUMIN in the last 72 hours. No results for input(s): LIPASE, AMYLASE in the last 72 hours. CBC:  Recent Labs  08/10/15 0520 08/11/15 0404  WBC 7.7 7.4  HGB 8.4* 8.3*  HCT 24.7* 24.1*  MCV 109.0* 106.0*  PLT 147* 145*   Cardiac Enzymes:  Recent Labs  08/09/15 1739 08/09/15 2244 08/10/15 0520  TROPONINI <0.03 0.03 0.06*   BNP: Invalid input(s): POCBNP D-Dimer: No results for input(s): DDIMER in the last 72 hours. Hemoglobin A1C:  Recent Labs  08/09/15 1147  HGBA1C 5.3   Fasting Lipid Panel: No results for input(s): CHOL, HDL, LDLCALC, TRIG, CHOLHDL, LDLDIRECT in the last 72 hours. Thyroid Function Tests:  Recent Labs  08/09/15 1147  TSH 2.415   Anemia Panel: No results for input(s): VITAMINB12, FOLATE, FERRITIN, TIBC, IRON, RETICCTPCT in the last 72 hours.   Physical Exam: Blood pressure 153/51, pulse 53, temperature 98.1 F (36.7 C), temperature source Oral, resp. rate 18, height 4\' 11"  (1.499 m), weight 50.531 kg (111 lb 6.4 oz), SpO2 98 %.   Wt Readings from Last 1 Encounters:  08/09/15 50.531 kg (111 lb 6.4 oz)     General appearance: alert and cooperative Head:  Normocephalic, without obvious abnormality, atraumatic Resp: clear to auscultation bilaterally Cardio: bradycardia GI: soft, non-tender; bowel sounds normal; no masses,  no organomegaly Extremities: extremities normal, atraumatic, no cyanosis or edema Neurologic: Grossly normal  TELEMETRY: Reviewed telemetry pt in sinus bradycardia:  ASSESSMENT AND PLAN:  Principal Problem:   Fracture of right inferior pubic ramus (HCC)-followed by ortho Active Problems:   Sacral fracture (HCC)   Syncope   Junctional rhythm   Bradycardia-plan ppm tomorrow. Procedure operator not available today. Remain off of rate related meds   Leukocytosis   Hyperkalemia   Acute renal insufficiency   Essential hypertension   Pelvic fracture (HCC)   Pelvic fracture, closed, initial encounter   Pressure ulcer    Teodoro Spray., MD, [facility] 08/11/2015 8:58 AM

## 2015-08-12 ENCOUNTER — Encounter: Payer: Self-pay | Admitting: Anesthesiology

## 2015-08-12 ENCOUNTER — Observation Stay: Payer: Medicare Other | Admitting: Anesthesiology

## 2015-08-12 ENCOUNTER — Encounter
Admission: EM | Disposition: A | Discharge status: Other Acute Inpatient Hospital | Payer: Self-pay | Source: Home / Self Care | Attending: Internal Medicine

## 2015-08-12 ENCOUNTER — Observation Stay: Payer: Medicare Other

## 2015-08-12 DIAGNOSIS — S32501A Unspecified fracture of right pubis, initial encounter for closed fracture: Secondary | ICD-10-CM | POA: Diagnosis not present

## 2015-08-12 HISTORY — PX: PACEMAKER INSERTION: SHX728

## 2015-08-12 SURGERY — INSERTION, CARDIAC PACEMAKER
Anesthesia: General | Wound class: Clean

## 2015-08-12 MED ORDER — ENSURE ENLIVE PO LIQD
237.0000 mL | Freq: Two times a day (BID) | ORAL | Status: DC
  Administered 2015-08-14 (×2): 237 mL via ORAL

## 2015-08-12 MED ORDER — LIDOCAINE 1 % OPTIME INJ - NO CHARGE
INTRAMUSCULAR | Status: DC | PRN
  Administered 2015-08-12: 30 mL

## 2015-08-12 MED ORDER — GENTAMICIN SULFATE 40 MG/ML IJ SOLN
INTRAMUSCULAR | Status: DC | PRN
  Administered 2015-08-12: 300 mL

## 2015-08-12 MED ORDER — CEFAZOLIN SODIUM 1-5 GM-% IV SOLN
1.0000 g | Freq: Four times a day (QID) | INTRAVENOUS | Status: AC
  Administered 2015-08-12 – 2015-08-13 (×3): 1 g via INTRAVENOUS
  Filled 2015-08-12 (×3): qty 50

## 2015-08-12 MED ORDER — ONDANSETRON HCL 4 MG/2ML IJ SOLN: 4.0000 mg | Freq: Four times a day (QID) | INTRAMUSCULAR | Status: DC | PRN

## 2015-08-12 MED ORDER — SODIUM CHLORIDE 0.9 % IJ SOLN
INTRAMUSCULAR | Status: AC
  Filled 2015-08-12: qty 50

## 2015-08-12 MED ORDER — SODIUM CHLORIDE 0.9 % IV SOLN
INTRAVENOUS | Status: DC | PRN
  Administered 2015-08-12: 11:00:00 via INTRAVENOUS

## 2015-08-12 MED ORDER — AMLODIPINE BESYLATE 10 MG PO TABS
10.0000 mg | ORAL_TABLET | Freq: Every day | ORAL | Status: DC
  Administered 2015-08-12 – 2015-08-15 (×4): 10 mg via ORAL
  Filled 2015-08-12 (×4): qty 1

## 2015-08-12 MED ORDER — MIDAZOLAM HCL 2 MG/2ML IJ SOLN
INTRAMUSCULAR | Status: DC | PRN
  Administered 2015-08-12 (×2): .5 mg via INTRAVENOUS

## 2015-08-12 MED ORDER — PROPOFOL 500 MG/50ML IV EMUL
INTRAVENOUS | Status: DC | PRN
  Administered 2015-08-12: 25 ug/kg/min via INTRAVENOUS

## 2015-08-12 MED ORDER — FENTANYL CITRATE (PF) 100 MCG/2ML IJ SOLN
INTRAMUSCULAR | Status: DC | PRN
  Administered 2015-08-12: 25 ug via INTRAVENOUS

## 2015-08-12 MED ORDER — FENTANYL CITRATE (PF) 100 MCG/2ML IJ SOLN: 25.0000 ug | INTRAMUSCULAR | Status: DC | PRN

## 2015-08-12 MED ORDER — EPHEDRINE SULFATE 50 MG/ML IJ SOLN
INTRAMUSCULAR | Status: DC | PRN
  Administered 2015-08-12: 5 mg via INTRAVENOUS

## 2015-08-12 MED ORDER — ACETAMINOPHEN 325 MG PO TABS: 325.0000 mg | ORAL_TABLET | ORAL | Status: DC | PRN

## 2015-08-12 MED ORDER — GENTAMICIN SULFATE 40 MG/ML IJ SOLN
INTRAMUSCULAR | Status: AC
  Filled 2015-08-12: qty 2

## 2015-08-12 SURGICAL SUPPLY — 35 items
BAG DECANTER FOR FLEXI CONT (MISCELLANEOUS) ×3 IMPLANT
BRUSH SCRUB 4% CHG (MISCELLANEOUS) ×3 IMPLANT
CABLE SURG 12 DISP A/V CHANNEL (MISCELLANEOUS) ×3 IMPLANT
CANISTER SUCT 1200ML W/VALVE (MISCELLANEOUS) ×3 IMPLANT
CHLORAPREP W/TINT 26ML (MISCELLANEOUS) ×3 IMPLANT
CLOSURE WOUND 1/4X4 (GAUZE/BANDAGES/DRESSINGS) ×1
COVER LIGHT HANDLE STERIS (MISCELLANEOUS) ×6 IMPLANT
COVER MAYO STAND STRL (DRAPES) ×3 IMPLANT
DRAPE C-ARM XRAY 36X54 (DRAPES) ×3 IMPLANT
DRESSING TELFA 4X3 1S ST N-ADH (GAUZE/BANDAGES/DRESSINGS) ×3 IMPLANT
DRSG TEGADERM 4X4.75 (GAUZE/BANDAGES/DRESSINGS) ×3 IMPLANT
DRSG TELFA 3X8 NADH (GAUZE/BANDAGES/DRESSINGS) ×3 IMPLANT
ELECT REM PT RETURN 9FT ADLT (ELECTROSURGICAL) ×3
ELECTRODE REM PT RTRN 9FT ADLT (ELECTROSURGICAL) ×1 IMPLANT
GLOVE BIO SURGEON STRL SZ7.5 (GLOVE) ×3 IMPLANT
GLOVE BIO SURGEON STRL SZ8 (GLOVE) ×3 IMPLANT
GOWN STRL REUS W/ TWL LRG LVL3 (GOWN DISPOSABLE) ×1 IMPLANT
GOWN STRL REUS W/ TWL XL LVL3 (GOWN DISPOSABLE) ×1 IMPLANT
GOWN STRL REUS W/TWL LRG LVL3 (GOWN DISPOSABLE) ×2
GOWN STRL REUS W/TWL XL LVL3 (GOWN DISPOSABLE) ×2
IMMOBILIZER SHDR MD LX WHT (SOFTGOODS) IMPLANT
IMMOBILIZER SHDR XL LX WHT (SOFTGOODS) IMPLANT
INTRO PACEMAKR LEAD 9FR 13CM (INTRODUCER)
INTRO PACEMKR SHEATH II 7FR (MISCELLANEOUS)
INTRODUCER PACEMKR LD 9FR 13CM (INTRODUCER) IMPLANT
INTRODUCER PACEMKR SHTH II 7FR (MISCELLANEOUS) IMPLANT
IV NS 500ML (IV SOLUTION) ×2
IV NS 500ML BAXH (IV SOLUTION) ×1 IMPLANT
KIT RM TURNOVER STRD PROC AR (KITS) ×3 IMPLANT
LABEL OR SOLS (LABEL) ×3 IMPLANT
MARKER SKIN DUAL TIP RULER LAB (MISCELLANEOUS) ×3 IMPLANT
PACK PACE INSERTION (MISCELLANEOUS) ×3 IMPLANT
PAD STATPAD (MISCELLANEOUS) ×3 IMPLANT
STRIP CLOSURE SKIN 1/4X4 (GAUZE/BANDAGES/DRESSINGS) ×2 IMPLANT
SUT SILK 0 SH 30 (SUTURE) ×9 IMPLANT

## 2015-08-12 NOTE — Progress Notes (Signed)
[place] at [place] NAME: Vickie Howard    MR#:  [id number]  DATE OF BIRTH:  May 10, 1936  SUBJECTIVE:   Patient going to same-day surgery for pacemaker placement. No acute issues overnight.  REVIEW OF SYSTEMS:    Review of Systems  Constitutional: Negative for fever and chills.  HENT: Negative for congestion and tinnitus.   Eyes: Negative for blurred vision and double vision.  Respiratory: Negative for cough, shortness of breath and wheezing.   Cardiovascular: Negative for chest pain, orthopnea and PND.  Gastrointestinal: Negative for nausea, vomiting, abdominal pain and diarrhea.  Genitourinary: Negative for dysuria and hematuria.  Neurological: Negative for dizziness, sensory change, focal weakness and weakness.  All other systems reviewed and are negative.   Nutrition: Heart Healthy Tolerating Diet: yes Tolerating PT: Home health PT  DRUG ALLERGIES:   Allergies  Allergen Reactions  . Codeine Sulfate Other (See Comments)    Reaction:  Syncope  . Levaquin [Levofloxacin] Nausea And Vomiting  . Oxycodone Other (See Comments)    Reaction:  Disoriented     VITALS:  Blood pressure 156/42, pulse 40, temperature 98.3 F (36.8 C), temperature source Oral, resp. rate 18, height 4\' 11"  (1.499 m), weight 50.531 kg (111 lb 6.4 oz), SpO2 98 %.  PHYSICAL EXAMINATION:   Physical Exam  Constitutional: She is oriented to person, place, and time and well-developed, well-nourished, and in no distress. No distress.  HENT:  Head: Normocephalic.  Eyes: No scleral icterus.  Neck: Normal range of motion. Neck supple. No JVD present. No tracheal deviation present.  Cardiovascular: Regular rhythm and normal heart sounds.  Exam reveals no gallop and no friction rub.   No murmur heard. Bradycardia  Pulmonary/Chest: Effort normal and breath sounds normal. No respiratory distress. She has no wheezes. She has no rales. She exhibits no  tenderness.  Abdominal: Soft. Bowel sounds are normal. She exhibits no distension and no mass. There is no tenderness. There is no rebound and no guarding.  Musculoskeletal: Normal range of motion. She exhibits no edema.  Neurological: She is alert and oriented to person, place, and time.  Skin: Skin is warm. No rash noted. No erythema.  Psychiatric: Affect and judgment normal.      LABORATORY PANEL:   CBC  Recent Labs Lab 08/11/15 0404  WBC 7.4  HGB 8.3*  HCT 24.1*  PLT 145*   ------------------------------------------------------------------------------------------------------------------  Chemistries   Recent Labs Lab 08/11/15 0404  NA 140  K 4.3  CL 109  CO2 28  GLUCOSE 85  BUN 18  CREATININE 0.87  CALCIUM 9.0   ------------------------------------------------------------------------------------------------------------------  Cardiac Enzymes  Recent Labs Lab 08/10/15 0520  TROPONINI 0.06*   ------------------------------------------------------------------------------------------------------------------  RADIOLOGY:  Dg Chest 2 View  08/11/2015  CLINICAL DATA:  Congestive heart failure. Intermittent chest pressure and shortness breath for 1 month. Atrial fibrillation. Recent bradycardia. Scheduled for pacemaker placement tomorrow. EXAM: CHEST  2 VIEW COMPARISON:  08/09/2015 FINDINGS: Mild cardiomegaly and tortuosity of thoracic aorta are stable. Both lungs are clear. Chronic elevation of left hemidiaphragm is stable. No evidence of pleural effusion. IMPRESSION: Stable mild cardiomegaly chronic elevation of left hemidiaphragm. No active lung disease. Electronically Signed   By: Earle Gell M.D.   On: 08/11/2015 20:16     ASSESSMENT AND PLAN:   80 year old female with past history of osteoporosis, history of previous hip surgery, chronic atrial fibrillation, hypertension, hyperlipidemia, GERD who presented to the hospital due to bilateral hip pain and  that noted  to have a pubic rami fracture and also noted to be severely bradycardic.  1 bradycardia/junctional rhythms:She was on high doses of Cardizem and digoxin, both of which have been discontinued.  She continues to have bradycardia and therefore patient is going for pacemaker today.  Echo shows normal ejection fraction.   2 acute renal failure: improved with IV fluids.   3 hyperkalemia: Resolved.  4 status post fall and pubic rami fracture: This is nonoperable. Continue supportive care with pain control.  Physical therapy recommends home with home health at discharge.   5 Depression: Continue Zoloft   6. Neuropathy: Continue Neurontin.   7  GERD : Continue Protonix.     Management plans discussed with the patient and family and they are in agreement.  CODE STATUS: Full Code  DVT Prophylaxis: Lovenox  TOTAL TIME TAKING CARE OF THIS PATIENT: 24 minutes.   POSSIBLE D/C tomorrow, DEPENDING ON CLINICAL CONDITION.   Vickie Howard M.D on 08/12/2015 at 10:50 AM  Between 7am to 6pm - Pager - [phone]  After 6pm go to [email or web address] - password [email or web address] [facility]  [place] [facility]  Office  [phone]  CC: Primary care physician; Adin Hector, MD

## 2015-08-12 NOTE — Op Note (Signed)
[facility]   08/09/2015 - 08/12/2015                     12:44 PM  PATIENT:  Vickie Howard    PRE-OPERATIVE DIAGNOSIS:  sinus bradycardia  POST-OPERATIVE DIAGNOSIS:  Same  PROCEDURE:  INSERTION PACEMAKER attempt unable to obtain access   SURGEON:  Barnell Shieh, MD    ANESTHESIA:     PREOPERATIVE INDICATIONS:  Vickie Howard is a  80 y.o. female with a diagnosis of sinus bradycardia who failed conservative measures and elected for surgical management.    The risks benefits and alternatives were discussed with the patient preoperatively including but not limited to the risks of infection, bleeding, cardiopulmonary complications, the need for revision surgery, among others, and the patient was willing to proceed.   OPERATIVE PROCEDURE: The patient was brought to the operating room the fasting state. The left pectoral region was prepped and draped in the usual sterile manner. Anesthesia was obtained 1% lidocaine. The pacemaker pocket was generated by electrocautery and blunt dissection. Access by fine needle aspiration was attempted however was unsuccessful. Due to severe kyphosis, the patient required that her head be elevated Main axis very difficult. 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 The procedure was concluded without attempt axis of the right subclavian vein. Patient was returned to the PACU in stable condition. Chest x-ray to rule out pneumothorax is pending.

## 2015-08-12 NOTE — Progress Notes (Signed)
[place]  SUBJECTIVE: No complaints. multiple family at bedside   Filed Vitals:   08/11/15 1113 08/11/15 1553 08/11/15 2035 08/12/15 0405  BP: 149/37  154/41 156/42  Pulse: 40 38 45 40  Temp: 98.3 F (36.8 C)  98.1 F (36.7 C) 98.3 F (36.8 C)  TempSrc: Oral  Oral Oral  Resp: 16  18   Height:      Weight:      SpO2: 96%  95% 98%    Intake/Output Summary (Last 24 hours) at 08/12/15 0903 Last data filed at 08/12/15 0725  Gross per 24 hour  Intake    243 ml  Output    825 ml  Net   -582 ml    LABS: Basic Metabolic Panel:  Recent Labs  08/10/15 0520 08/11/15 0404  NA 140 140  K 4.4 4.3  CL 111 109  CO2 25 28  GLUCOSE 86 85  BUN 18 18  CREATININE 0.88 0.87  CALCIUM 8.8* 9.0   Liver Function Tests: No results for input(s): AST, ALT, ALKPHOS, BILITOT, PROT, ALBUMIN in the last 72 hours. No results for input(s): LIPASE, AMYLASE in the last 72 hours. CBC:  Recent Labs  08/10/15 0520 08/11/15 0404  WBC 7.7 7.4  HGB 8.4* 8.3*  HCT 24.7* 24.1*  MCV 109.0* 106.0*  PLT 147* 145*   Cardiac Enzymes:  Recent Labs  08/09/15 1739 08/09/15 2244 08/10/15 0520  TROPONINI <0.03 0.03 0.06*   BNP: Invalid input(s): POCBNP D-Dimer: No results for input(s): DDIMER in the last 72 hours. Hemoglobin A1C:  Recent Labs  08/09/15 1147  HGBA1C 5.3   Fasting Lipid Panel: No results for input(s): CHOL, HDL, LDLCALC, TRIG, CHOLHDL, LDLDIRECT in the last 72 hours. Thyroid Function Tests:  Recent Labs  08/09/15 1147  TSH 2.415   Anemia Panel: No results for input(s): VITAMINB12, FOLATE, FERRITIN, TIBC, IRON, RETICCTPCT in the last 72 hours.   Physical Exam: Blood pressure 156/42, pulse 40, temperature 98.3 F (36.8 C), temperature source Oral, resp. rate 18, height 4\' 11"  (1.499 m), weight 50.531 kg (111 lb 6.4 oz), SpO2 98 %.   Wt Readings from Last 1 Encounters:  08/09/15 50.531 kg (111 lb 6.4 oz)     General  appearance: alert and cooperative Resp: clear to auscultation bilaterally Cardio: bradycardia Neurologic: Grossly normal  TELEMETRY: Reviewed telemetry pt in sinus bradycardia:  ASSESSMENT AND PLAN:  Principal Problem:   Fracture of right inferior pubic ramus (HCC) Active Problems:   Sacral fracture (HCC)   Syncope   Junctional rhythm-for ppm today due to symptomatic bradycardia.    Bradycardia   Leukocytosis   Hyperkalemia   Acute renal insufficiency   Essential hypertension   Pelvic fracture (HCC)   Pelvic fracture, closed, initial encounter   Pressure ulcer    Teodoro Spray., MD, [facility] 08/12/2015 9:03 AM

## 2015-08-12 NOTE — Care Management (Signed)
Informed that arrangements made by cardiology for transfer to [facility] for pacemaker insertion.  Attempt today unsuccessful due to anatomical configuration.  Instructed staff and gave written instructions on transfer process.  At present there is no bed and name of accepting physician is not known.

## 2015-08-12 NOTE — Progress Notes (Signed)
Dr. Saralyn Pilar paged to clarify that patient is not having any more procedures done today. MD confirmed that patient can eat. MD talked to son after procedure and patient will stay here until they figure out the next plan.

## 2015-08-12 NOTE — Progress Notes (Signed)
Patient will be transferred to [facility]. Request has been accepted and transfer center has called for report on the patient. Informed primary RN that there is no bed available at this time and if the patient's condition is to change to update them. Patient is resting comfortably and her heart rate is anywhere from high 30's to 60's when she moves around. Will continue to monitor.

## 2015-08-12 NOTE — Anesthesia Preprocedure Evaluation (Signed)
Anesthesia Evaluation  Patient identified by MRN, date of birth, ID band Patient awake    Reviewed: Allergy & Precautions, H&P , NPO status , Patient's Chart, lab work & pertinent test results  History of Anesthesia Complications Negative for: history of anesthetic complications  Airway Mallampati: III  TM Distance: >3 FB Neck ROM: limited    Dental  (+) Poor Dentition, Chipped   Pulmonary neg pulmonary ROS, neg shortness of breath,    Pulmonary exam normal breath sounds clear to auscultation       Cardiovascular Exercise Tolerance: Good hypertension, (-) angina(-) Past MI and (-) DOE Normal cardiovascular exam+ dysrhythmias Atrial Fibrillation  Rhythm:regular Rate:Normal     Neuro/Psych negative neurological ROS  negative psych ROS   GI/Hepatic Neg liver ROS, hiatal hernia, GERD  Controlled and Medicated,  Endo/Other  negative endocrine ROS  Renal/GU Renal disease  negative genitourinary   Musculoskeletal  (+) Arthritis ,   Abdominal   Peds  Hematology negative hematology ROS (+)   Anesthesia Other Findings Past Medical History:   Heart murmur                                                 History of hiatal hernia                                     Arthritis                                                    Occasional tremors                                             Comment:hands   Atrial fibrillation (HCC)                                    HLD (hyperlipidemia)                                         HTN (hypertension)                                           GERD (gastroesophageal reflux disease)                      Past Surgical History:   TUBAL LIGATION                                                JOINT REPLACEMENT  Comment:right total hip 2015   Left total hip arthroplasty                      10/12/2014     TOTAL HIP ARTHROPLASTY                           Left 10/12/2014       Comment:Procedure: TOTAL HIP ARTHROPLASTY;  Surgeon:               Dereck Leep, MD;  Location: [facility] ORS;                Service: Orthopedics;  Laterality: Left;  BMI    Body Mass Index   22.48 kg/m 2      Reproductive/Obstetrics negative OB ROS                             Anesthesia Physical Anesthesia Plan  ASA: IV  Anesthesia Plan: General   Post-op Pain Management:    Induction:   Airway Management Planned:   Additional Equipment:   Intra-op Plan:   Post-operative Plan:   Informed Consent: I have reviewed the patients History and Physical, chart, labs and discussed the procedure including the risks, benefits and alternatives for the proposed anesthesia with the patient or authorized representative who has indicated his/her understanding and acceptance.   Dental Advisory Given  Plan Discussed with: Anesthesiologist, CRNA and Surgeon  Anesthesia Plan Comments:         Anesthesia Quick Evaluation

## 2015-08-12 NOTE — Progress Notes (Signed)
Initial Nutrition Assessment    INTERVENTION:   Meals and Snacks: Cater to patient preferences Medical Food Supplement Therapy: recommend addition of Ensure Enlive po BID between meals,  each supplement provides 350 kcal and 20 grams of protein  NUTRITION DIAGNOSIS:   Increased nutrient needs related to acute illness, wound healing as evidenced by estimated needs.  GOAL:   Patient will meet greater than or equal to 90% of their needs  MONITOR:   PO intake, Supplement acceptance, Labs, Skin  REASON FOR ASSESSMENT:    (pressure ulcer)    ASSESSMENT:    Pt admitted with pelvic fracture, syncope, acute renal insuffiency  Past Medical History  Diagnosis Date  . Heart murmur   . History of hiatal hernia   . Arthritis   . Occasional tremors     hands  . Atrial fibrillation ([place])   . HLD (hyperlipidemia)   . HTN (hypertension)   . GERD (gastroesophageal reflux disease)     Diet Order:  Diet Heart Room service appropriate?: Yes; Fluid consistency:: Thin   Energy Intake: recorded po intake 60% of meals on average, likes Ensure  Skin:   (stage II pressure ulcer on sacrum)  Last BM:  3/8    Recent Labs Lab 08/09/15 1147 08/10/15 0520 08/11/15 0404  NA 139 140 140  K 5.4* 4.4 4.3  CL 111 111 109  CO2 18* 25 28  BUN 24* 18 18  CREATININE 1.06* 0.88 0.87  CALCIUM 9.7 8.8* 9.0  GLUCOSE 103* 86 85    Meds: reviewed  Height:   Ht Readings from Last 1 Encounters:  08/09/15 4\' 11"  (1.499 m)    Weight:   Wt Readings from Last 1 Encounters:  08/09/15 111 lb 6.4 oz (50.531 kg)    BMI:  Body mass index is 22.49 kg/(m^2).  EDUCATION NEEDS:   No education needs identified at this time  [place], [place], LDN [phone] Pager  [phone] Weekend/On-Call Pager

## 2015-08-12 NOTE — Progress Notes (Signed)
Notified Dr. Benjie Karvonen that patient's pacemaker was unable to be placed. Also notified that blood pressure is elevated at 172/42 and heart rate is in the 40's at rest. Dr. Benjie Karvonen stated to give patient 10mg  of norvasc and stated she will get in touch with Dr. Saralyn Pilar for the plan for the patient.

## 2015-08-12 NOTE — Anesthesia Postprocedure Evaluation (Signed)
Anesthesia Post Note  Patient: Vickie Howard  Procedure(s) Performed: Procedure(s) (LRB): INSERTION PACEMAKER attempt unable to obtain access  (N/A)  Patient location during evaluation: PACU Anesthesia Type: General Level of consciousness: awake and alert Pain management: pain level controlled Vital Signs Assessment: post-procedure vital signs reviewed and stable Respiratory status: spontaneous breathing, nonlabored ventilation, respiratory function stable and patient connected to nasal cannula oxygen Cardiovascular status: blood pressure returned to baseline and stable Postop Assessment: no signs of nausea or vomiting Anesthetic complications: no    Last Vitals:  Filed Vitals:   08/12/15 1325 08/12/15 1342  BP: 159/60 172/42  Pulse: 45 44  Temp: 36.6 C 36.4 C  Resp: 17 18    Last Pain:  Filed Vitals:   08/12/15 1342  PainSc: 7                  Broadus John K Azriel Dancy

## 2015-08-12 NOTE — Progress Notes (Signed)
Pacemaker not able to be placed today due to anatomical configuration. Will transfer to [facility] for consideration for leadless pacemaker.

## 2015-08-12 NOTE — Transfer of Care (Signed)
Immediate Anesthesia Transfer of Care Note  Patient: Vickie Howard  Procedure(s) Performed: Procedure(s): INSERTION PACEMAKER attempt unable to obtain access  (N/A)  Patient Location: PACU  Anesthesia Type:General  Level of Consciousness: awake and patient cooperative  Airway & Oxygen Therapy: Patient Spontanous Breathing  Post-op Assessment: Report given to RN  Post vital signs: Reviewed and stable  Last Vitals:  Filed Vitals:   08/12/15 1103 08/12/15 1249  BP: 172/54 165/53  Pulse: 45 80  Temp: 36.3 C 98.58F  Resp: 16 15    Complications: No apparent anesthesia complications

## 2015-08-12 NOTE — Progress Notes (Signed)
PT Cancellation Note  Patient Details Name: Vickie Howard MRN: [id number] DOB: 1936-03-27   Cancelled Treatment:    Reason Eval/Treat Not Completed: Patient at procedure or test/unavailable.  Pt is currently off floor receiving a pacemaker.  PT will be re-attempted at another date and time.  Mittie Bodo, SPT Mittie Bodo 08/12/2015, 12:58 PM

## 2015-08-12 NOTE — Progress Notes (Signed)
Notified CCMD of patient's transport to OR.

## 2015-08-12 NOTE — Discharge Summary (Signed)
[facility] [place] NAME: Jenine Margrave    MR#:  [id number]  DATE OF BIRTH:  1935-10-04  DATE OF ADMISSION:  08/09/2015  ADMITTING PHYSICIAN: [name], MD  DATE OF DISCHARGE: 08/12/2015  PRIMARY CARE PHYSICIAN: [name], MD    ADMISSION DIAGNOSIS:  Pain [R52] Junctional rhythm [I49.8] Bilateral hip pain [M25.551, M25.552] Inferior pubic ramus fracture, right, closed, initial encounter ([place]) [S32.591A] Midline low back pain without sciatica [M54.5] Syncope, unspecified syncope type [R55] Closed fracture of sacrum, unspecified fracture morphology, initial encounter ([place]) [S32.10XA]  DISCHARGE DIAGNOSIS:  Principal Problem:   Fracture of right inferior pubic ramus (HCC) Active Problems:   Sacral fracture (HCC)   Syncope   Junctional rhythm   Bradycardia   Leukocytosis   Hyperkalemia   Acute renal insufficiency   Essential hypertension   Pelvic fracture (HCC)   Pelvic fracture, closed, initial encounter   Pressure ulcer   SECONDARY DIAGNOSIS:   Past Medical History  Diagnosis Date  . Heart murmur   . History of hiatal hernia   . Arthritis   . Occasional tremors     hands  . Atrial fibrillation ([place])   . HLD (hyperlipidemia)   . HTN (hypertension)   . GERD (gastroesophageal reflux disease)     HOSPITAL COURSE:   80 year old female with past history of osteoporosis, history of previous hip surgery, chronic atrial fibrillation, hypertension, hyperlipidemia, GERD who presented to the hospital due to bilateral hip pain and that noted to have a pubic rami fracture and also noted to be severely bradycardic.  1 bradycardia/junctional rhythms:She was on high doses of Cardizem and digoxin, both of these medication were discontinued on admission Due to persistent bradycardia with heart rates in the 30s-40s a pacemaker was recommended. This was attempted by Dr Lorinda Creed on 3/9. Due to severe kyphosis, the patient  required that her head be elevated Main axis very difficult. 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 The procedure was concluded without attempt axis of the right subclavian vein She will be transferred to [facility] for pacemaker evaluation and insertion.  Echo shows normal ejection fraction.   2 acute renal failure: improved with IV fluids.   3 hyperkalemia: Resolved.  4 status post fall and pubic rami fracture: This is nonoperable. Continue supportive care with pain control.  Physical therapy recommends home with home health at discharge.   5 Depression: Continue Zoloft   6. Neuropathy: Continue Neurontin.   7 GERD : Continue Protonix.    DISCHARGE CONDITIONS AND DIET:  Stable condition heart healthy diet  CONSULTS OBTAINED:  Treatment Team:  Teodoro Spray, MD Yolonda Kida, MD  DRUG ALLERGIES:   Allergies  Allergen Reactions  . Codeine Sulfate Other (See Comments)    Reaction:  Syncope  . Levaquin [Levofloxacin] Nausea And Vomiting  . Oxycodone Other (See Comments)    Reaction:  Disoriented     DISCHARGE MEDICATIONS:   Current Discharge Medication List    CONTINUE these medications which have NOT CHANGED   Details  acetaminophen (TYLENOL) 500 MG tablet Take 1,000 mg by mouth every 6 (six) hours as needed for mild pain or headache.    aspirin EC 81 MG tablet Take 81 mg by mouth daily.    atorvastatin (LIPITOR) 20 MG tablet Take 20 mg by mouth daily.    cyanocobalamin (,VITAMIN B-12,) 1000 MCG/ML injection Inject 1,000 mcg into the muscle every 30 (thirty) days.  digoxin (LANOXIN) 0.125 MG tablet Take 0.125 mg by mouth daily.    diltiazem (CARDIZEM) 90 MG tablet Take 90 mg by mouth 4 (four) times daily.    docusate sodium (COLACE) 100 MG capsule Take 100 mg by mouth 2 (two) times daily.    ferrous sulfate 325 (65 FE) MG tablet Take 325 mg by mouth daily with breakfast.    gabapentin (NEURONTIN) 100 MG capsule Take 100 mg by mouth  daily.     Multiple Vitamins-Minerals (PRESERVISION AREDS 2) CAPS Take 1 capsule by mouth daily.    omeprazole (PRILOSEC) 20 MG capsule Take 20 mg by mouth daily.    potassium chloride SA (K-DUR,KLOR-CON) 20 MEQ tablet Take 40 mEq by mouth 2 (two) times daily.    sertraline (ZOLOFT) 50 MG tablet Take 50 mg by mouth daily.              Today   CHIEF COMPLAINT:  Doing well this am no acute issues   VITAL SIGNS:  Blood pressure 144/43, pulse 61, temperature 97.6 F (36.4 C), temperature source Oral, resp. rate 18, height 4\' 11"  (1.499 m), weight 50.531 kg (111 lb 6.4 oz), SpO2 99 %.   REVIEW OF SYSTEMS:  Review of Systems  Constitutional: Positive for malaise/fatigue. Negative for fever and chills.  HENT: Negative for sore throat.   Eyes: Negative for blurred vision.  Respiratory: Negative for cough, hemoptysis, shortness of breath and wheezing.   Cardiovascular: Negative for chest pain, palpitations and leg swelling.  Gastrointestinal: Negative for nausea, vomiting, abdominal pain, diarrhea and blood in stool.  Genitourinary: Negative for dysuria.  Musculoskeletal: Negative for back pain.  Neurological: Negative for dizziness, tremors and headaches.  Endo/Heme/Allergies: Does not bruise/bleed easily.     PHYSICAL EXAMINATION:  GENERAL:  80 y.o.-year-old patient lying in the bed with no acute distress.  NECK:  Supple, no jugular venous distention. No thyroid enlargement, no tenderness.  LUNGS: Normal breath sounds bilaterally, no wheezing, rales,rhonchi  No use of accessory muscles of respiration.  CARDIOVASCULAR: S1, S2 bradycardia. No murmurs, rubs, or gallops.  ABDOMEN: Soft, non-tender, non-distended. Bowel sounds present. No organomegaly or mass.  EXTREMITIES: No pedal edema, cyanosis, or clubbing.  PSYCHIATRIC: The patient is alert and oriented x 3.  SKIN: No obvious rash, lesion, or ulcer.   DATA REVIEW:   CBC  Recent Labs Lab 08/11/15 0404  WBC 7.4   HGB 8.3*  HCT 24.1*  PLT 145*    Chemistries   Recent Labs Lab 08/11/15 0404  NA 140  K 4.3  CL 109  CO2 28  GLUCOSE 85  BUN 18  CREATININE 0.87  CALCIUM 9.0    Cardiac Enzymes  Recent Labs Lab 08/09/15 1739 08/09/15 2244 08/10/15 0520  TROPONINI <0.03 0.03 0.06*    Microbiology Results  @[email or web address] @  RADIOLOGY:  Dg Chest 2 View  08/11/2015  CLINICAL DATA:  Congestive heart failure. Intermittent chest pressure and shortness breath for 1 month. Atrial fibrillation. Recent bradycardia. Scheduled for pacemaker placement tomorrow. EXAM: CHEST  2 VIEW COMPARISON:  08/09/2015 FINDINGS: Mild cardiomegaly and tortuosity of thoracic aorta are stable. Both lungs are clear. Chronic elevation of left hemidiaphragm is stable. No evidence of pleural effusion. IMPRESSION: Stable mild cardiomegaly chronic elevation of left hemidiaphragm. No active lung disease. Electronically Signed   By: Earle Gell M.D.   On: 08/11/2015 20:16   Dg Chest Port 1 View  08/12/2015  CLINICAL DATA:  Sick sinus syndrome.  Pacemaker placement EXAM: PORTABLE CHEST 1  VIEW COMPARISON:  08/11/2015 FINDINGS: No pacemaker device.  No pneumothorax post attempted pacemaker. Heart size upper normal. Pulmonary vascularity is normal. Negative for edema. Small left effusion has developed in the interval. Mild left lower lobe atelectasis/ infiltrate slightly more prominent today. Right lung remains clear. IMPRESSION: Slight increase in left lower lobe airspace disease and small left effusion. Possible pneumonia versus atelectasis. Unsuccessful pacemaker placement. Electronically Signed   By: Franchot Gallo M.D.   On: 08/12/2015 16:07   Dg C-arm 1-60 Min-no Report  08/12/2015  CLINICAL DATA: surgery C-ARM 1-60 MINUTES Fluoroscopy was utilized by the requesting physician.  No radiographic interpretation.      Management plans discussed with the patient and she is in agreement. Stable for discharge [facility]  Patient should  follow up with [facility]:     Code Status Orders        Start     Ordered   08/09/15 1659  Full code   Continuous     08/09/15 1658    Code Status History    Date Active Date Inactive Code Status Order ID Comments User Context   10/16/2014  3:06 AM 10/20/2014  8:46 PM DNR [id number]  Lance Coon, MD Inpatient   10/12/2014 12:20 PM 10/15/2014 10:27 PM Full Code [id number]  Dereck Leep, MD Inpatient      TOTAL TIME TAKING CARE OF THIS PATIENT: 35 minutes.    Note: This dictation was prepared with Dragon dictation along with smaller phrase technology. Any transcriptional errors that result from this process are unintentional.  Arlisha Patalano M.D on 08/12/2015 at 6:57 PM  Between 7am to 6pm - Pager - [phone] After 6pm go to [email or web address] - password [email or web address] [facility]  [place] [facility]  Office  [phone]  CC: Primary care physician; Adin Hector, MD

## 2015-08-12 NOTE — Progress Notes (Signed)
Report given to same day surgery. Confirmed with patient that CHG bath was completed twice last night and this morning. Fluids started. Consent is in chart. Per same day to hold all medications before procedure. Patient updated and family is at bedside.

## 2015-08-13 ENCOUNTER — Encounter: Payer: Self-pay | Admitting: Cardiology

## 2015-08-13 DIAGNOSIS — S32501A Unspecified fracture of right pubis, initial encounter for closed fracture: Secondary | ICD-10-CM | POA: Diagnosis not present

## 2015-08-13 LAB — CBC
HCT: 26.7 % — ABNORMAL LOW (ref 35.0–47.0)
Hemoglobin: 9.1 g/dL — ABNORMAL LOW (ref 12.0–16.0)
MCH: 36.6 pg — AB (ref 26.0–34.0)
MCHC: 34 g/dL (ref 32.0–36.0)
MCV: 107.6 fL — AB (ref 80.0–100.0)
PLATELETS: 171 10*3/uL (ref 150–440)
RBC: 2.48 MIL/uL — ABNORMAL LOW (ref 3.80–5.20)
RDW: 15.7 % — AB (ref 11.5–14.5)
WBC: 10.5 10*3/uL (ref 3.6–11.0)

## 2015-08-13 MED ORDER — ENSURE ENLIVE PO LIQD: 237.0000 mL | Freq: Two times a day (BID) | ORAL | Status: DC

## 2015-08-13 NOTE — Progress Notes (Signed)
Physical Therapy Treatment Patient Details Name: Vickie Howard MRN: [id number] DOB: 09-14-1935 Today's Date: 08/13/2015    History of Present Illness Pt is a 80 y.o. female with PMH of A-Fib, HTN, and heart murmur.  Pt presented with B hip pain and back pain and difficulty walking s/p a fall 10 days ago. Pt was admitted for sacral fx, R inferior pubic ramus fx, bradycardia and acute renal insufficiency.      PT Comments    Pt's HR was 56 bpm beginning of session.  Pt told PT that she was depressed due to her situation was out of her control; nursing notified.  During this talk, pt's HR increased to 113 bpm.  Pt was given appropriate empathetic response and instructed on diaphragmatic breathing.  Pt's HR decreased to 68 bpm.  Pt then performed exercises within bed.  Pt's HR during in bed exercise was 68-74 bpm.  Pt's HR at end of session was 64 bpm.  Pt still waiting for transfer to outside hospital.     Follow Up Recommendations  Home health PT     Equipment Recommendations       Recommendations for Other Services       Precautions / Restrictions Precautions Precautions: Fall Restrictions Weight Bearing Restrictions: No Other Position/Activity Restrictions: WBAT     Mobility  Bed Mobility               General bed mobility comments: pt was not moved from bed  Transfers                 General transfer comment: pt was not moved from bed   Ambulation/Gait                 Stairs            Wheelchair Mobility    Modified Rankin (Stroke Patients Only)       Balance                                    Cognition Arousal/Alertness: Awake/alert Behavior During Therapy: WFL for tasks assessed/performed Overall Cognitive Status: Within Functional Limits for tasks assessed                      Exercises Total Joint Exercises Ankle Circles/Pumps: AROM;Both (3x15) Short Arc Quad: AROM;Both (3x10) Heel Slides:  AROM;Both (3x10) Hip ABduction/ADduction: AROM;Both (3x10) Straight Leg Raises: AROM;Both (3x10)  All exercises were performed supine in bed.    General Comments   Nursing was contacted and cleared pt for physical therapy.  Pt was agreeable and session was modified due to HR response.  Pt's son was present at the beginning of session.       Pertinent Vitals/Pain Pain Assessment: 0-10 Pain Score: 6  Pain Location: hips and lower back  Pain Descriptors / Indicators: Aching;Discomfort Pain Intervention(s): Limited activity within patient's tolerance;Monitored during session;Premedicated before session;Repositioned    Home Living                      Prior Function            PT Goals (current goals can now be found in the care plan section) Acute Rehab PT Goals Patient Stated Goal: to go home PT Goal Formulation: With patient Time For Goal Achievement: 08/24/15 Potential to Achieve Goals: Fair Additional Goals Additional Goal #1: Pt will have  appropriate HR repsonse during activity Progress towards PT goals: Progressing toward goals    Frequency  Min 2X/week    PT Plan Frequency needs to be updated (Due to cardiac status and plan for transfer)    Co-evaluation             End of Session   Activity Tolerance: Other (comment) (Session modified due to HR )       Time: [id number] PT Time Calculation (min) (ACUTE ONLY): 26 min  Charges:                       G Codes:       Mittie Bodo, SPT Mittie Bodo 08/13/2015, 4:16 PM

## 2015-08-13 NOTE — Care Management (Signed)
Found that  Per Linna Hoff at [facility] the following hospitals are in network with patient's [place], [place], [place].  There are currently no beds available at [facility].  Spoke with attending regarding another facility.  She stated it was up to the cardiologist.

## 2015-08-13 NOTE — Progress Notes (Signed)
Spoke to [facility] transfer center to get an update. Facility is still on divert and does not have a bed available for the patient now. They stated there are over 100 patient's in the ER there and 40 of them haven't even been seen yet. Family and patient updated. Per reports from care management, [facility] is also on divert.

## 2015-08-13 NOTE — Discharge Summary (Signed)
[place] at [place] NAME: Vickie Howard    MR#:  [id number]  DATE OF BIRTH:  January 24, 1936  DATE OF ADMISSION:  08/09/2015  ADMITTING PHYSICIAN: [name], MD  DATE OF DISCHARGE: 08/13/2015  PRIMARY CARE PHYSICIAN: [name], MD    ADMISSION DIAGNOSIS:  Pain [R52] Junctional rhythm [I49.8] Bilateral hip pain [M25.551, M25.552] Inferior pubic ramus fracture, right, closed, initial encounter ([place]) [S32.591A] Midline low back pain without sciatica [M54.5] Syncope, unspecified syncope type [R55] Closed fracture of sacrum, unspecified fracture morphology, initial encounter ([place]) [S32.10XA]  DISCHARGE DIAGNOSIS:  Principal Problem:   Fracture of right inferior pubic ramus (HCC) Active Problems:   Sacral fracture (HCC)   Syncope   Junctional rhythm   Bradycardia   Leukocytosis   Hyperkalemia   Acute renal insufficiency   Essential hypertension   Pelvic fracture (HCC)   Pelvic fracture, closed, initial encounter   Pressure ulcer   SECONDARY DIAGNOSIS:   Past Medical History  Diagnosis Date  . Heart murmur   . History of hiatal hernia   . Arthritis   . Occasional tremors     hands  . Atrial fibrillation ([place])   . HLD (hyperlipidemia)   . HTN (hypertension)   . GERD (gastroesophageal reflux disease)     HOSPITAL COURSE:   80 year old female with past history of osteoporosis, history of previous hip surgery, chronic atrial fibrillation, hypertension, hyperlipidemia, GERD who presented to the hospital due to bilateral hip pain and that noted to have a pubic rami fracture and also noted to be severely bradycardic.  1 bradycardia/junctional rhythms:She was on high doses of Cardizem and digoxin, both of these medication were discontinued on admission Due to persistent bradycardia with heart rates in the 30s-40s a pacemaker was recommended. This was attempted by Dr Lorinda Creed on 3/9. Due to severe kyphosis, the  patient required that her head be elevated Main axis very difficult. 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 The procedure was concluded without attempt axis of the right subclavian vein She will be transferred to [facility] for pacemaker evaluation and insertion.  Echo shows normal ejection fraction.   2 acute renal failure: improved with IV fluids.   3 hyperkalemia: Resolved.  4 status post fall and pubic rami fracture: This is nonoperable. Continue supportive care with pain control.  Physical therapy recommends home with home health at discharge.   5 Depression: Continue Zoloft   6. Neuropathy: Continue Neurontin.   7 GERD : Continue Protonix.  10. Essential HTN: Patient was started on Norvasc.  DISCHARGE CONDITIONS AND DIET:  Stable condition heart healthy diet  CONSULTS OBTAINED:  Treatment Team:  Teodoro Spray, MD Yolonda Kida, MD  DRUG ALLERGIES:   Allergies  Allergen Reactions  . Codeine Sulfate Other (See Comments)    Reaction:  Syncope  . Levaquin [Levofloxacin] Nausea And Vomiting  . Oxycodone Other (See Comments)    Reaction:  Disoriented     DISCHARGE MEDICATIONS:   Current Discharge Medication List    START taking these medications   Details  feeding supplement, ENSURE ENLIVE, (ENSURE ENLIVE) LIQD Take 237 mLs by mouth 2 (two) times daily between meals. Qty: 237 mL, Refills: 12      CONTINUE these medications which have NOT CHANGED   Details  acetaminophen (TYLENOL) 500 MG tablet Take 1,000 mg by mouth every 6 (six) hours as needed for mild pain or headache.    aspirin EC 81 MG  tablet Take 81 mg by mouth daily.    atorvastatin (LIPITOR) 20 MG tablet Take 20 mg by mouth daily.    cyanocobalamin (,VITAMIN B-12,) 1000 MCG/ML injection Inject 1,000 mcg into the muscle every 30 (thirty) days.    docusate sodium (COLACE) 100 MG capsule Take 100 mg by mouth 2 (two) times daily.    ferrous sulfate 325 (65 FE) MG tablet Take 325  mg by mouth daily with breakfast.    gabapentin (NEURONTIN) 100 MG capsule Take 100 mg by mouth daily.     Multiple Vitamins-Minerals (PRESERVISION AREDS 2) CAPS Take 1 capsule by mouth daily.    omeprazole (PRILOSEC) 20 MG capsule Take 20 mg by mouth daily.    potassium chloride SA (K-DUR,KLOR-CON) 20 MEQ tablet Take 40 mEq by mouth 2 (two) times daily.    sertraline (ZOLOFT) 50 MG tablet Take 50 mg by mouth daily.      STOP taking these medications     digoxin (LANOXIN) 0.125 MG tablet      diltiazem (CARDIZEM) 90 MG tablet               Today   CHIEF COMPLAINT:  Doing well this am no acute issues   VITAL SIGNS:  Blood pressure 144/40, pulse 42, temperature 98 F (36.7 C), temperature source Oral, resp. rate 18, height 4\' 11"  (1.499 m), weight 50.531 kg (111 lb 6.4 oz), SpO2 98 %.   REVIEW OF SYSTEMS:  Review of Systems  Constitutional: Positive for malaise/fatigue. Negative for fever and chills.  HENT: Negative for sore throat.   Eyes: Negative for blurred vision.  Respiratory: Negative for cough, hemoptysis, shortness of breath and wheezing.   Cardiovascular: Negative for chest pain, palpitations and leg swelling.  Gastrointestinal: Negative for nausea, vomiting, abdominal pain, diarrhea and blood in stool.  Genitourinary: Negative for dysuria.  Musculoskeletal: Negative for back pain.  Neurological: Negative for dizziness, tremors and headaches.  Endo/Heme/Allergies: Does not bruise/bleed easily.     PHYSICAL EXAMINATION:  GENERAL:  80 y.o.-year-old patient lying in the bed with no acute distress.  NECK:  Supple, no jugular venous distention. No thyroid enlargement, no tenderness.  LUNGS: Normal breath sounds bilaterally, no wheezing, rales,rhonchi  No use of accessory muscles of respiration.  CARDIOVASCULAR: S1, S2 bradycardia. No murmurs, rubs, or gallops.  ABDOMEN: Soft, non-tender, non-distended. Bowel sounds present. No organomegaly or mass.   EXTREMITIES: No pedal edema, cyanosis, or clubbing.  PSYCHIATRIC: The patient is alert and oriented x 3.  SKIN: No obvious rash, lesion, or ulcer.   DATA REVIEW:   CBC  Recent Labs Lab 08/13/15 0517  WBC 10.5  HGB 9.1*  HCT 26.7*  PLT 171    Chemistries   Recent Labs Lab 08/11/15 0404  NA 140  K 4.3  CL 109  CO2 28  GLUCOSE 85  BUN 18  CREATININE 0.87  CALCIUM 9.0    Cardiac Enzymes  Recent Labs Lab 08/09/15 1739 08/09/15 2244 08/10/15 0520  TROPONINI <0.03 0.03 0.06*    Microbiology Results  @MICRORSLT48 @  RADIOLOGY:  Dg Chest 2 View  08/11/2015  CLINICAL DATA:  Congestive heart failure. Intermittent chest pressure and shortness breath for 1 month. Atrial fibrillation. Recent bradycardia. Scheduled for pacemaker placement tomorrow. EXAM: CHEST  2 VIEW COMPARISON:  08/09/2015 FINDINGS: Mild cardiomegaly and tortuosity of thoracic aorta are stable. Both lungs are clear. Chronic elevation of left hemidiaphragm is stable. No evidence of pleural effusion. IMPRESSION: Stable mild cardiomegaly chronic elevation of left hemidiaphragm.  No active lung disease. Electronically Signed   By: Earle Gell M.D.   On: 08/11/2015 20:16   Dg Chest Port 1 View  08/12/2015  CLINICAL DATA:  Sick sinus syndrome.  Pacemaker placement EXAM: PORTABLE CHEST 1 VIEW COMPARISON:  08/11/2015 FINDINGS: No pacemaker device.  No pneumothorax post attempted pacemaker. Heart size upper normal. Pulmonary vascularity is normal. Negative for edema. Small left effusion has developed in the interval. Mild left lower lobe atelectasis/ infiltrate slightly more prominent today. Right lung remains clear. IMPRESSION: Slight increase in left lower lobe airspace disease and small left effusion. Possible pneumonia versus atelectasis. Unsuccessful pacemaker placement. Electronically Signed   By: Franchot Gallo M.D.   On: 08/12/2015 16:07   Dg C-arm 1-60 Min-no Report  08/12/2015  CLINICAL DATA: surgery C-ARM 1-60  MINUTES Fluoroscopy was utilized by the requesting physician.  No radiographic interpretation.      Management plans discussed with the patient and she is in agreement. Stable for discharge [facility]  Patient should follow up with [place]:     Code Status Orders        Start     Ordered   08/09/15 1659  Full code   Continuous     08/09/15 1658    Code Status History    Date Active Date Inactive Code Status Order ID Comments User Context   10/16/2014  3:06 AM 10/20/2014  8:46 PM DNR [id number]  Lance Coon, MD Inpatient   10/12/2014 12:20 PM 10/15/2014 10:27 PM Full Code [id number]  Dereck Leep, MD Inpatient      TOTAL TIME TAKING CARE OF THIS PATIENT: 35 minutes.    Note: This dictation was prepared with Dragon dictation along with smaller phrase technology. Any transcriptional errors that result from this process are unintentional.  Osmany Azer M.D on 08/13/2015 at 7:42 AM  Between 7am to 6pm - Pager - [phone] After 6pm go to [email or web address] - password [email or web address] [facility]  [place] [facility]  Office  [phone]  CC: Primary care physician; Adin Hector, MD

## 2015-08-14 DIAGNOSIS — S32501A Unspecified fracture of right pubis, initial encounter for closed fracture: Secondary | ICD-10-CM | POA: Diagnosis not present

## 2015-08-14 LAB — GLUCOSE, CAPILLARY: GLUCOSE-CAPILLARY: 94 mg/dL (ref 65–99)

## 2015-08-14 NOTE — Progress Notes (Signed)
Pt remains a/o and comfortable with toradol. Vss. Heart rate in 50-60's all day. [facility]. famly at bedside. Pt up to br with walker and single assist.

## 2015-08-14 NOTE — Progress Notes (Signed)
HR non-sustained, fluctuating from 40 to 59. Will continue to monitor.

## 2015-08-14 NOTE — Care Management Note (Signed)
Case Management Note  Patient Details  Name: LAYLAMARIE MESKILL MRN: [id number] Date of Birth: Dec 04, 1935  Subjective/Objective:         Attempted to contact the [facility] several times today and have been unable to speak with a Scientist, research (life sciences). Will attempt again tomorrow.  Action/Plan:   Expected Discharge Date:                  Expected Discharge Plan:     In-House Referral:     Discharge planning Services     Post Acute Care Choice:    Choice offered to:     DME Arranged:    DME Agency:     HH Arranged:    [place] Agency:     Status of Service:     Medicare Important Message Given:    Date Medicare IM Given:    Medicare IM give by:    Date Additional Medicare IM Given:    Additional Medicare Important Message give by:     If discussed at [place] of Stay Meetings, dates discussed:    Additional Comments:  Haileigh Pitz A, RN 08/14/2015, 4:31 PM

## 2015-08-14 NOTE — Progress Notes (Signed)
[place] at [place] NAME: Vickie Howard    MR#:  [id number]  DATE OF BIRTH:  [birth year]  SUBJECTIVE:   No complaint REVIEW OF SYSTEMS:    Review of Systems  Constitutional: Negative for fever and chills.  HENT: Negative for congestion and tinnitus.   Eyes: Negative for blurred vision and double vision.  Respiratory: Negative for cough, shortness of breath and wheezing.   Cardiovascular: Negative for chest pain, orthopnea and PND.  Gastrointestinal: Negative for nausea, vomiting, abdominal pain and diarrhea.  Genitourinary: Negative for dysuria and hematuria.  Neurological: No weakness. Negative for dizziness, sensory change and focal weakness.  All other systems reviewed and are negative.   Nutrition: Heart Healthy Tolerating Diet: yes Tolerating PT: Home health PT  DRUG ALLERGIES:   Allergies  Allergen Reactions  . Codeine Sulfate Other (See Comments)    Reaction:  Syncope  . Levaquin [Levofloxacin] Nausea And Vomiting  . Oxycodone Other (See Comments)    Reaction:  Disoriented     VITALS:  Blood pressure 137/29, pulse 50, temperature 98 F (36.7 C), temperature source Oral, resp. rate 18, height 4\' 11"  (1.499 m), weight 50.531 kg (111 lb 6.4 oz), SpO2 96 %.  PHYSICAL EXAMINATION:   Physical Exam  Constitutional: She is oriented to person, place, and time and well-developed, well-nourished, and in no distress. No distress.  HENT:  Head: Normocephalic.  Eyes: No scleral icterus.  Neck: Normal range of motion. Neck supple. No JVD present. No tracheal deviation present.  Cardiovascular: Regular rhythm and normal heart sounds.  Exam reveals no gallop and no friction rub.   No murmur heard. Bradycardia  Pulmonary/Chest: Effort normal and breath sounds normal. No respiratory distress. She has no wheezes. She has no rales. She exhibits no tenderness.  Abdominal: Soft. Bowel sounds are normal. She exhibits no  distension and no mass. There is no tenderness. There is no rebound and no guarding.  Musculoskeletal: Normal range of motion. She exhibits no edema.  Neurological: She is alert and oriented to person, place, and time.  Skin: Skin is warm. No rash noted. No erythema.  Psychiatric: Affect and judgment normal.      LABORATORY PANEL:   CBC  Recent Labs Lab 08/13/15 0517  WBC 10.5  HGB 9.1*  HCT 26.7*  PLT 171   ------------------------------------------------------------------------------------------------------------------  Chemistries   Recent Labs Lab 08/11/15 0404  NA 140  K 4.3  CL 109  CO2 28  GLUCOSE 85  BUN 18  CREATININE 0.87  CALCIUM 9.0   ------------------------------------------------------------------------------------------------------------------  Cardiac Enzymes  Recent Labs Lab 08/10/15 0520  TROPONINI 0.06*   ------------------------------------------------------------------------------------------------------------------  RADIOLOGY:  No results found.   ASSESSMENT AND PLAN:   80 year old female with past history of osteoporosis, history of previous hip surgery, chronic atrial fibrillation, hypertension, hyperlipidemia, GERD who presented to the hospital due to bilateral hip pain and that noted to have a pubic rami fracture and also noted to be severely bradycardic.  1 bradycardia/junctional rhythms She was on high doses of Cardizem and digoxin, both of these medication were discontinued on admission Due to persistent bradycardia with heart rates in the 30s-40s a pacemaker was recommended. This was attempted by Dr Lorinda Creed on 3/9. Due to severe kyphosis, the patient required that her head be elevated Main axis very difficult. 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 The procedure was concluded without attempt axis of the right subclavian vein She will be transferred to [facility] for  pacemaker evaluation and insertion.  Echo shows  normal ejection fraction.  2 acute renal failure: improved with IV fluids.   3 hyperkalemia: Resolved.  4 status post fall and pubic rami fracture: This is nonoperable. Continue supportive care with pain control.  Physical therapy recommends home with home health at discharge.   5 Depression: Continue Zoloft   6. Neuropathy: Continue Neurontin.   7  GERD : Continue Protonix.  8. Essential HTN: Patient was started on Norvasc.  All the records are reviewed and case discussed with Care Management/Social Workerr. Management plans discussed with the patient, her son and they are in agreement.  CODE STATUS: Full code  TOTAL TIME TAKING CARE OF THIS PATIENT:28 minutes.  Greater than 50% time was spent on coordination of care and face-to-face counseling.  Waiting for transfer to [facility] if bed is available. POSSIBLE D/C IN 1-2 DAYS, DEPENDING ON CLINICAL CONDITION.   Demetrios Loll M.D on 08/14/2015 at 4:51 PM  Between 7am to 6pm - Pager - [phone]  After 6pm go to [email or web address] - password [email or web address] [facility]  [place] [facility]  Office  [phone]  CC: Primary care physician; Adin Hector, MD

## 2015-08-14 NOTE — Progress Notes (Addendum)
Vickie Howard.. RN from [facility] called to get update from the  patient and stated they're working diligently to get a bed for her and hopefully it will happen today and if not Sunday. The message will be conveyed to the patient since she's very anxious about  getting a bed at [facility].

## 2015-08-15 DIAGNOSIS — R011 Cardiac murmur, unspecified: Secondary | ICD-10-CM | POA: Insufficient documentation

## 2015-08-15 DIAGNOSIS — I350 Nonrheumatic aortic (valve) stenosis: Secondary | ICD-10-CM | POA: Insufficient documentation

## 2015-08-15 DIAGNOSIS — S32501A Unspecified fracture of right pubis, initial encounter for closed fracture: Secondary | ICD-10-CM | POA: Diagnosis not present

## 2015-08-15 NOTE — Progress Notes (Addendum)
Ines Bloomer RN at [facility] just called stated they have a bed for the patient with a room number 3119. The staff at [facility] is responsible to arrange for transportation.  The nurse indicated [facility] should send along imaging on disc and a summary. The son is updated of bed availability.

## 2015-08-15 NOTE — Progress Notes (Signed)
Pt to be discharged to [facility] this morning. Report called to james at the facility. Report given to [facility]. Tele exchanged for theirs. Central tele notified.pt comfortable. Family in attendance.

## 2015-08-15 NOTE — Discharge Summary (Addendum)
[place] at [place] NAME: Vickie Howard    MR#:  [id number]  DATE OF BIRTH:  1936-05-02  DATE OF ADMISSION:  08/09/2015  ADMITTING PHYSICIAN: [name], MD  DATE OF DISCHARGE: 08/15/2015  PRIMARY CARE PHYSICIAN: [name], MD    ADMISSION DIAGNOSIS:  Pain [R52] Junctional rhythm [I49.8] Bilateral hip pain [M25.551, M25.552] Inferior pubic ramus fracture, right, closed, initial encounter ([place]) [S32.591A] Midline low back pain without sciatica [M54.5] Syncope, unspecified syncope type [R55] Closed fracture of sacrum, unspecified fracture morphology, initial encounter ([place]) [S32.10XA]  DISCHARGE DIAGNOSIS:  Principal Problem:   Fracture of right inferior pubic ramus (HCC) Active Problems:   Sacral fracture (HCC)   Syncope   Junctional rhythm   Bradycardia   Leukocytosis   Hyperkalemia   Acute renal insufficiency   Essential hypertension   Pelvic fracture (HCC)   Pelvic fracture, closed, initial encounter   Pressure ulcer   SECONDARY DIAGNOSIS:   Past Medical History  Diagnosis Date  . Heart murmur   . History of hiatal hernia   . Arthritis   . Occasional tremors     hands  . Atrial fibrillation ([place])   . HLD (hyperlipidemia)   . HTN (hypertension)   . GERD (gastroesophageal reflux disease)     HOSPITAL COURSE:   80 year old female with past history of osteoporosis, history of previous hip surgery, chronic atrial fibrillation, hypertension, hyperlipidemia, GERD who presented to the hospital due to bilateral hip pain and that noted to have a pubic rami fracture and also noted to be severely bradycardic.  1 bradycardia/junctional rhythms:She was on high doses of Cardizem and digoxin, both of these medication were discontinued on admission Due to persistent bradycardia with heart rates in the 30s-40s a pacemaker was recommended. This was attempted by Dr Lorinda Creed on 3/9. Due to severe kyphosis, the  patient required that her head be elevated Main axis very difficult. 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 The procedure was concluded without attempt axis of the right subclavian vein She is being transferred to [facility] for pacemaker evaluation and insertion.  Echo shows normal ejection fraction.   2 acute renal failure: improved with IV fluids.   3 hyperkalemia: Resolved.  4 status post fall and pubic rami fracture: This is nonoperable. Continue supportive care with pain control.  Physical therapy recommends home with home health at discharge.   5 Depression: Continue Zoloft   6. Neuropathy: Continue Neurontin.   7 GERD : Continue Protonix.  10. Essential HTN: Patient was started on Norvasc.  DISCHARGE CONDITIONS AND DIET:  Stable condition,  heart healthy diet  CONSULTS OBTAINED:  Treatment Team:  Teodoro Spray, MD Yolonda Kida, MD  DRUG ALLERGIES:   Allergies  Allergen Reactions  . Codeine Sulfate Other (See Comments)    Reaction:  Syncope  . Levaquin [Levofloxacin] Nausea And Vomiting  . Oxycodone Other (See Comments)    Reaction:  Disoriented     DISCHARGE MEDICATIONS:   Current Discharge Medication List    START taking these medications   Details  feeding supplement, ENSURE ENLIVE, (ENSURE ENLIVE) LIQD Take 237 mLs by mouth 2 (two) times daily between meals. Qty: 237 mL, Refills: 12      CONTINUE these medications which have NOT CHANGED   Details  acetaminophen (TYLENOL) 500 MG tablet Take 1,000 mg by mouth every 6 (six) hours as needed for mild pain or headache.    aspirin EC 81  MG tablet Take 81 mg by mouth daily.    atorvastatin (LIPITOR) 20 MG tablet Take 20 mg by mouth daily.    cyanocobalamin (,VITAMIN B-12,) 1000 MCG/ML injection Inject 1,000 mcg into the muscle every 30 (thirty) days.    docusate sodium (COLACE) 100 MG capsule Take 100 mg by mouth 2 (two) times daily.    ferrous sulfate 325 (65 FE) MG tablet Take  325 mg by mouth daily with breakfast.    gabapentin (NEURONTIN) 100 MG capsule Take 100 mg by mouth daily.     Multiple Vitamins-Minerals (PRESERVISION AREDS 2) CAPS Take 1 capsule by mouth daily.    omeprazole (PRILOSEC) 20 MG capsule Take 20 mg by mouth daily.    potassium chloride SA (K-DUR,KLOR-CON) 20 MEQ tablet Take 40 mEq by mouth 2 (two) times daily.    sertraline (ZOLOFT) 50 MG tablet Take 50 mg by mouth daily.      STOP taking these medications     digoxin (LANOXIN) 0.125 MG tablet      diltiazem (CARDIZEM) 90 MG tablet               Today   CHIEF COMPLAINT:  No complaint.   VITAL SIGNS:  Blood pressure 129/45, pulse 50, temperature 97.9 F (36.6 C), temperature source Oral, resp. rate 16, height 4\' 11"  (1.499 m), weight 50.531 kg (111 lb 6.4 oz), SpO2 93 %.   REVIEW OF SYSTEMS:  Review of Systems  Constitutional: Positive for malaise/fatigue. Negative for fever and chills.  HENT: Negative for sore throat.   Eyes: Negative for blurred vision.  Respiratory: Negative for cough, hemoptysis, shortness of breath and wheezing.   Cardiovascular: Negative for chest pain, palpitations and leg swelling.  Gastrointestinal: Negative for nausea, vomiting, abdominal pain, diarrhea and blood in stool.  Genitourinary: Negative for dysuria.  Musculoskeletal: Negative for back pain.  Neurological: Negative for dizziness, tremors and headaches.  Endo/Heme/Allergies: Does not bruise/bleed easily.     PHYSICAL EXAMINATION:  GENERAL:  80 y.o.-year-old patient lying in the bed with no acute distress.  NECK:  Supple, no jugular venous distention. No thyroid enlargement, no tenderness.  LUNGS: Normal breath sounds bilaterally, no wheezing, rales,rhonchi  No use of accessory muscles of respiration.  CARDIOVASCULAR: S1, S2 bradycardia. No murmurs, rubs, or gallops.  ABDOMEN: Soft, non-tender, non-distended. Bowel sounds present. No organomegaly or mass.  EXTREMITIES: No  pedal edema, cyanosis, or clubbing.  PSYCHIATRIC: The patient is alert and oriented x 3.  SKIN: No obvious rash, lesion, or ulcer.   DATA REVIEW:   CBC  Recent Labs Lab 08/13/15 0517  WBC 10.5  HGB 9.1*  HCT 26.7*  PLT 171    Chemistries   Recent Labs Lab 08/11/15 0404  NA 140  K 4.3  CL 109  CO2 28  GLUCOSE 85  BUN 18  CREATININE 0.87  CALCIUM 9.0    Cardiac Enzymes  Recent Labs Lab 08/09/15 1739 08/09/15 2244 08/10/15 0520  TROPONINI <0.03 0.03 0.06*    Microbiology Results  @MICRORSLT48 @  RADIOLOGY:  No results found.    Management plans discussed with the patient and she is in agreement. Stable for discharge [facility]  Patient should follow up with [place]:     Code Status Orders        Start     Ordered   08/09/15 1659  Full code   Continuous     08/09/15 1658    Code Status History    Date  Active Date Inactive Code Status Order ID Comments User Context   10/16/2014  3:06 AM 10/20/2014  8:46 PM DNR [id number]  Lance Coon, MD Inpatient   10/12/2014 12:20 PM 10/15/2014 10:27 PM Full Code [id number]  Dereck Leep, MD Inpatient      TOTAL TIME TAKING CARE OF THIS PATIENT: 33 minutes.    Note: This dictation was prepared with Dragon dictation along with smaller phrase technology. Any transcriptional errors that result from this process are unintentional.  Demetrios Loll M.D on 08/15/2015 at 8:26 AM  Between 7am to 6pm - Pager - [phone] After 6pm go to [email or web address] - password [email or web address] [facility]  [place] [facility]  Office  [phone]  CC: Primary care physician; Adin Hector, MD

## 2015-09-02 DIAGNOSIS — Z950 Presence of cardiac pacemaker: Secondary | ICD-10-CM | POA: Insufficient documentation

## 2016-07-10 DIAGNOSIS — D696 Thrombocytopenia, unspecified: Secondary | ICD-10-CM | POA: Insufficient documentation

## 2017-01-08 DIAGNOSIS — I700 Atherosclerosis of aorta: Secondary | ICD-10-CM | POA: Insufficient documentation

## 2017-10-14 NOTE — Progress Notes (Signed)
[place]  Telephone:(336) [phone] Fax:(336) [phone]  ID: Demetrio Lapping OB: 1935-10-04  MR#: [id number]  AOZ#:[id number]  Patient Care Team: Adin Hector, MD as PCP - General (Internal Medicine)  CHIEF COMPLAINT: Anemia, unspecified.  INTERVAL HISTORY: Patient is an 82 year old female who was recently found to have a significantly decreased hemoglobin with normal iron stores.  She admits to chronic weakness and fatigue, but otherwise feels well.  She has no neurologic complaints.  She denies any recent fevers or illnesses.  She has a good appetite and denies weight loss.  She denies any chest pain or shortness of breath.  She denies any nausea, vomiting, constipation, or diarrhea.  She has no melena or hematochezia.  She has no urinary complaints.  Patient feels at her baseline offers no specific complaints today.  REVIEW OF SYSTEMS:   Review of Systems  Constitutional: Negative.  Negative for fever, malaise/fatigue and weight loss.  Respiratory: Negative.  Negative for cough, hemoptysis and shortness of breath.   Cardiovascular: Negative.  Negative for chest pain and leg swelling.  Gastrointestinal: Negative.  Negative for abdominal pain, blood in stool and melena.  Genitourinary: Negative.  Negative for hematuria.  Musculoskeletal: Negative.   Skin: Negative.  Negative for rash.  Neurological: Negative.  Negative for sensory change, focal weakness and weakness.  Psychiatric/Behavioral: Negative.  The patient is not nervous/anxious.     As per HPI. Otherwise, a complete review of systems is negative.  PAST MEDICAL HISTORY: Past Medical History:  Diagnosis Date  . Arthritis   . Atrial fibrillation ([place])   . GERD (gastroesophageal reflux disease)   . Heart murmur   . History of hiatal hernia   . HLD (hyperlipidemia)   . HTN (hypertension)   . Occasional tremors    hands    PAST SURGICAL HISTORY: Past Surgical History:  Procedure Laterality  Date  . JOINT REPLACEMENT     right total hip 2015  . Left total hip arthroplasty  10/12/2014  . PACEMAKER INSERTION N/A 08/12/2015   Procedure: INSERTION PACEMAKER attempt unable to obtain access ;  Surgeon: Isaias Cowman, MD;  Location: [facility] ORS;  Service: Cardiovascular;  Laterality: N/A;  . TOTAL HIP ARTHROPLASTY Left 10/12/2014   Procedure: TOTAL HIP ARTHROPLASTY;  Surgeon: Dereck Leep, MD;  Location: [facility] ORS;  Service: Orthopedics;  Laterality: Left;  . TUBAL LIGATION      FAMILY HISTORY: Family History  Problem Relation Age of Onset  . Aneurysm Father   . Breast cancer Daughter     ADVANCED DIRECTIVES (Y/N):  N  HEALTH MAINTENANCE: Social History   Tobacco Use  . Smoking status: Never Smoker  . Smokeless tobacco: Never Used  Substance Use Topics  . Alcohol use: No  . Drug use: No     Colonoscopy:  PAP:  Bone density:  Lipid panel:  Allergies  Allergen Reactions  . Codeine Sulfate Other (See Comments)    Reaction:  Syncope  . Levaquin [Levofloxacin] Nausea And Vomiting  . Oxycodone Other (See Comments)    Reaction:  Disoriented     Current Outpatient Medications  Medication Sig Dispense Refill  . aspirin EC 81 MG tablet Take 81 mg by mouth daily.    Marland Kitchen atorvastatin (LIPITOR) 20 MG tablet Take 20 mg by mouth daily.    . Cyanocobalamin (VITAMIN B 12 PO) Take 1 mcg by mouth daily.    Marland Kitchen docusate sodium (COLACE) 100 MG capsule Take 100 mg by mouth 2 (two)  times daily.    . ferrous sulfate 325 (65 FE) MG tablet Take 325 mg by mouth daily with breakfast.    . Multiple Vitamins-Minerals (PRESERVISION AREDS 2) CAPS Take 1 capsule by mouth daily.    Marland Kitchen omeprazole (PRILOSEC) 20 MG capsule Take 20 mg by mouth daily.    . sertraline (ZOLOFT) 50 MG tablet Take 50 mg by mouth daily.    Marland Kitchen acetaminophen (TYLENOL) 500 MG tablet Take 1,000 mg by mouth every 6 (six) hours as needed for mild pain or headache.    . cyanocobalamin (,VITAMIN B-12,) 1000 MCG/ML injection Inject  1,000 mcg into the muscle every 30 (thirty) days.    . feeding supplement, ENSURE ENLIVE, (ENSURE ENLIVE) LIQD Take 237 mLs by mouth 2 (two) times daily between meals. (Patient not taking: Reported on 10/15/2017) 237 mL 12  . gabapentin (NEURONTIN) 100 MG capsule Take 100 mg by mouth daily.     . potassium chloride SA (K-DUR,KLOR-CON) 20 MEQ tablet Take 40 mEq by mouth 2 (two) times daily.     No current facility-administered medications for this visit.     OBJECTIVE: Vitals:   10/15/17 1336  BP: (!) 130/59  Pulse: 63  Resp: 18  Temp: 98.3 F (36.8 C)  SpO2: 99%     Body mass index is 19.49 kg/m.    ECOG FS:0 - Asymptomatic  General: Thin, no acute distress. Eyes: Pink conjunctiva, anicteric sclera. HEENT: Normocephalic, moist mucous membranes, clear oropharnyx. Lungs: Clear to auscultation bilaterally. Heart: Regular rate and rhythm. No rubs, murmurs, or gallops. Abdomen: Soft, nontender, nondistended. No organomegaly noted, normoactive bowel sounds. Musculoskeletal: No edema, cyanosis, or clubbing. Neuro: Alert, answering all questions appropriately. Cranial nerves grossly intact. Skin: No rashes or petechiae noted. Psych: Normal affect. Lymphatics: No cervical, calvicular, axillary or inguinal LAD.   LAB RESULTS:  Lab Results  Component Value Date   NA 140 08/11/2015   K 4.3 08/11/2015   CL 109 08/11/2015   CO2 28 08/11/2015   GLUCOSE 85 08/11/2015   BUN 18 08/11/2015   CREATININE 0.87 08/11/2015   CALCIUM 9.0 08/11/2015   PROT 6.9 10/29/2014   ALBUMIN 3.2 (L) 10/29/2014   AST 18 10/29/2014   ALT 18 10/29/2014   ALKPHOS 85 10/29/2014   BILITOT 0.2 (L) 10/29/2014   GFRNONAA >60 08/11/2015   GFRAA >60 08/11/2015    Lab Results  Component Value Date   WBC 8.2 10/15/2017   NEUTROABS 2.8 10/29/2014   HGB 8.4 (L) 10/15/2017   HCT 24.8 (L) 10/15/2017   MCV 105.9 (H) 10/15/2017   PLT 178 10/15/2017     STUDIES: No results found.  ASSESSMENT: Anemia,  unspecified.  PLAN:   1. Anemia, unspecified: Patient's hemoglobin continues to be decreased at 8.4 with normal iron stores.  Her LDH is within normal limits likely indicating that there is no underlying hemolysis.  The remainder of her laboratory work including B12 and folate are pending at time of dictation.  It is possible given her elevated MCV that patient has underlying MDS.  This would take a bone marrow biopsy to diagnose, which is not necessary at this point.  Patient agreed that if she became more symptomatic or hemoglobin continues to trend down she would consider one in the near future.  No intervention is needed at this time. Return to clinic in 3 weeks with repeat laboratory work and further evaluation. 2.  Elevated MCV: B12 and folate are pending at time of dictation.  Possible MDS  and bone marrow biopsy as above.  Approximately 45 minutes was spent in discussion of which greater than 50% was consultation.  Patient expressed understanding and was in agreement with this plan. She also understands that She can call clinic at any time with any questions, concerns, or complaints.    Lloyd Huger, MD   10/15/2017 3:30 PM

## 2017-10-15 ENCOUNTER — Encounter: Payer: Self-pay | Admitting: Oncology

## 2017-10-15 ENCOUNTER — Inpatient Hospital Stay: Payer: Medicare Other | Attending: Oncology | Admitting: Oncology

## 2017-10-15 ENCOUNTER — Inpatient Hospital Stay: Payer: Medicare Other

## 2017-10-15 VITALS — BP 130/59 | HR 63 | Temp 98.3°F | Resp 18 | Ht 59.0 in | Wt 96.5 lb

## 2017-10-15 DIAGNOSIS — D649 Anemia, unspecified: Secondary | ICD-10-CM | POA: Insufficient documentation

## 2017-10-15 LAB — CBC
HEMATOCRIT: 24.8 % — AB (ref 35.0–47.0)
HEMOGLOBIN: 8.4 g/dL — AB (ref 12.0–16.0)
MCH: 35.7 pg — AB (ref 26.0–34.0)
MCHC: 33.7 g/dL (ref 32.0–36.0)
MCV: 105.9 fL — ABNORMAL HIGH (ref 80.0–100.0)
Platelets: 178 10*3/uL (ref 150–440)
RBC: 2.34 MIL/uL — ABNORMAL LOW (ref 3.80–5.20)
RDW: 16.1 % — AB (ref 11.5–14.5)
WBC: 8.2 10*3/uL (ref 3.6–11.0)

## 2017-10-15 LAB — RETICULOCYTES
RBC.: 2.37 MIL/uL — AB (ref 3.80–5.20)
Retic Count, Absolute: 28.4 10*3/uL (ref 19.0–183.0)
Retic Ct Pct: 1.2 % (ref 0.4–3.1)

## 2017-10-15 LAB — VITAMIN B12: VITAMIN B 12: 1413 pg/mL — AB (ref 180–914)

## 2017-10-15 LAB — LACTATE DEHYDROGENASE: LDH: 131 U/L (ref 98–192)

## 2017-10-15 LAB — FOLATE: Folate: 36 ng/mL (ref >5.9)

## 2017-10-15 LAB — DAT, POLYSPECIFIC AHG (ARMC ONLY): POLYSPECIFIC AHG TEST: NEGATIVE

## 2017-10-16 LAB — PROTEIN ELECTROPHORESIS, SERUM
A/G RATIO SPE: 1 (ref 0.7–1.7)
Albumin ELP: 3.8 g/dL (ref 2.9–4.4)
Alpha-1-Globulin: 0.3 g/dL (ref 0.0–0.4)
Alpha-2-Globulin: 1 g/dL (ref 0.4–1.0)
Beta Globulin: 0.9 g/dL (ref 0.7–1.3)
Gamma Globulin: 1.5 g/dL (ref 0.4–1.8)
Globulin, Total: 3.8 g/dL (ref 2.2–3.9)
M-SPIKE, %: 1.2 g/dL — AB
TOTAL PROTEIN ELP: 7.6 g/dL (ref 6.0–8.5)

## 2017-10-16 LAB — HAPTOGLOBIN: HAPTOGLOBIN: 319 mg/dL — AB (ref 34–200)

## 2017-10-16 LAB — ERYTHROPOIETIN: ERYTHROPOIETIN: 88.7 m[IU]/mL — AB (ref 2.6–18.5)

## 2017-11-03 NOTE — Progress Notes (Signed)
[place]  Telephone:(336) [phone] Fax:(336) [phone]  ID: Vickie Howard OB: 10/18/1935  MR#: [id number]  UGQ#:[id number]  Patient Care Team: Adin Hector, MD as PCP - General (Internal Medicine)  CHIEF COMPLAINT: Anemia, unspecified.  INTERVAL HISTORY: Patient returns to clinic today for repeat laboratory work and further evaluation.  She continues to feel well and is at her baseline.  She admits to chronic weakness and fatigue that is unchanged. She denies any recent fevers or illnesses.  She has a fair appetite and denies weight loss.  She denies any chest pain or shortness of breath.  She denies any nausea, vomiting, constipation, or diarrhea.  She has no melena or hematochezia.  She has no urinary complaints.  Patient offers no further specific complaints today.  REVIEW OF SYSTEMS:   Review of Systems  Constitutional: Positive for malaise/fatigue. Negative for fever and weight loss.  Respiratory: Negative.  Negative for cough, hemoptysis and shortness of breath.   Cardiovascular: Negative.  Negative for chest pain and leg swelling.  Gastrointestinal: Negative.  Negative for abdominal pain, blood in stool and melena.  Genitourinary: Negative.  Negative for hematuria.  Musculoskeletal: Negative.  Negative for back pain.  Skin: Negative.  Negative for rash.  Neurological: Positive for weakness. Negative for sensory change and focal weakness.  Psychiatric/Behavioral: Negative.  The patient is not nervous/anxious.     As per HPI. Otherwise, a complete review of systems is negative.  PAST MEDICAL HISTORY: Past Medical History:  Diagnosis Date  . Arthritis   . Atrial fibrillation ([place])   . GERD (gastroesophageal reflux disease)   . Heart murmur   . History of hiatal hernia   . HLD (hyperlipidemia)   . HTN (hypertension)   . Occasional tremors    hands    PAST SURGICAL HISTORY: Past Surgical History:  Procedure Laterality Date  . JOINT  REPLACEMENT     right total hip 2015  . Left total hip arthroplasty  10/12/2014  . PACEMAKER INSERTION N/A 08/12/2015   Procedure: INSERTION PACEMAKER attempt unable to obtain access ;  Surgeon: Isaias Cowman, MD;  Location: [facility] ORS;  Service: Cardiovascular;  Laterality: N/A;  . TOTAL HIP ARTHROPLASTY Left 10/12/2014   Procedure: TOTAL HIP ARTHROPLASTY;  Surgeon: Dereck Leep, MD;  Location: [facility] ORS;  Service: Orthopedics;  Laterality: Left;  . TUBAL LIGATION      FAMILY HISTORY: Family History  Problem Relation Age of Onset  . Aneurysm Father   . Breast cancer Daughter     ADVANCED DIRECTIVES (Y/N):  N  HEALTH MAINTENANCE: Social History   Tobacco Use  . Smoking status: Never Smoker  . Smokeless tobacco: Never Used  Substance Use Topics  . Alcohol use: No  . Drug use: No     Colonoscopy:  PAP:  Bone density:  Lipid panel:  Allergies  Allergen Reactions  . Codeine Sulfate Other (See Comments)    Reaction:  Syncope  . Levaquin [Levofloxacin] Nausea And Vomiting  . Oxycodone Other (See Comments)    Reaction:  Disoriented     Current Outpatient Medications  Medication Sig Dispense Refill  . acetaminophen (TYLENOL) 500 MG tablet Take 1,000 mg by mouth every 6 (six) hours as needed for mild pain or headache.    Marland Kitchen aspirin EC 81 MG tablet Take 81 mg by mouth daily.    Marland Kitchen atorvastatin (LIPITOR) 20 MG tablet Take 20 mg by mouth daily.    . Cyanocobalamin (VITAMIN B 12 PO) Take  1 mcg by mouth daily.    Marland Kitchen docusate sodium (COLACE) 100 MG capsule Take 100 mg by mouth 2 (two) times daily.    . ferrous sulfate 325 (65 FE) MG tablet Take 325 mg by mouth daily with breakfast.    . gabapentin (NEURONTIN) 100 MG capsule Take 100 mg by mouth daily.     . Multiple Vitamins-Minerals (PRESERVISION AREDS 2) CAPS Take 1 capsule by mouth daily.    Marland Kitchen omeprazole (PRILOSEC) 20 MG capsule Take 20 mg by mouth daily.    . sertraline (ZOLOFT) 50 MG tablet Take 50 mg by mouth daily.    .  feeding supplement, ENSURE ENLIVE, (ENSURE ENLIVE) LIQD Take 237 mLs by mouth 2 (two) times daily between meals. (Patient not taking: Reported on 10/15/2017) 237 mL 12   No current facility-administered medications for this visit.     OBJECTIVE: Vitals:   11/05/17 1018  BP: (!) 151/74  Pulse: 82  Resp: 18  Temp: (!) 96.5 F (35.8 C)     Body mass index is 19.25 kg/m.    ECOG FS:0 - Asymptomatic  General: Thin, no acute distress. Eyes: Pink conjunctiva, anicteric sclera. Lungs: Clear to auscultation bilaterally. Heart: Regular rate and rhythm. No rubs, murmurs, or gallops. Abdomen: Soft, nontender, nondistended. No organomegaly noted, normoactive bowel sounds. Musculoskeletal: No edema, cyanosis, or clubbing. Neuro: Alert, answering all questions appropriately. Cranial nerves grossly intact. Skin: No rashes or petechiae noted. Psych: Normal affect.  LAB RESULTS:  Lab Results  Component Value Date   NA 140 08/11/2015   K 4.3 08/11/2015   CL 109 08/11/2015   CO2 28 08/11/2015   GLUCOSE 85 08/11/2015   BUN 18 08/11/2015   CREATININE 0.87 08/11/2015   CALCIUM 9.0 08/11/2015   PROT 6.9 10/29/2014   ALBUMIN 3.2 (L) 10/29/2014   AST 18 10/29/2014   ALT 18 10/29/2014   ALKPHOS 85 10/29/2014   BILITOT 0.2 (L) 10/29/2014   GFRNONAA >60 08/11/2015   GFRAA >60 08/11/2015    Lab Results  Component Value Date   WBC 10.7 11/05/2017   NEUTROABS 6.9 (H) 11/05/2017   HGB 9.1 (L) 11/05/2017   HCT 27.0 (L) 11/05/2017   MCV 106.2 (H) 11/05/2017   PLT 155 11/05/2017     STUDIES: No results found.  ASSESSMENT: Anemia, unspecified.  PLAN:   1. Anemia, unspecified: Patient's hemoglobin continues to be decreased, but essentially unchanged.  She has a high erythropoietin level with decreased reticulocyte count likely indicating a bone marrow disorder such as MDS.  The remainder of her laboratory work is either negative or within normal limits.  We discussed the possibility of a  bone marrow biopsy to confirm the diagnosis, but agreed that this is not necessary and an 82 year old female.  Would consider biopsy if patient became more symptomatic or her blood counts continue to drop.  No further interventions are needed.  Return to clinic in 3 months with repeat laboratory work and further evaluation.   2.  Elevated MCV: B12 and folate are within normal limits.  This may be secondary to underlying MDS as above.  Approximately 30 minutes was spent in discussion of which greater than 50% was consultation.  Patient expressed understanding and was in agreement with this plan. She also understands that She can call clinic at any time with any questions, concerns, or complaints.    Lloyd Huger, MD   11/07/2017 5:55 PM

## 2017-11-05 ENCOUNTER — Inpatient Hospital Stay: Payer: Medicare Other | Attending: Oncology

## 2017-11-05 ENCOUNTER — Inpatient Hospital Stay (HOSPITAL_BASED_OUTPATIENT_CLINIC_OR_DEPARTMENT_OTHER): Payer: Medicare Other | Admitting: Oncology

## 2017-11-05 ENCOUNTER — Other Ambulatory Visit: Payer: Self-pay

## 2017-11-05 VITALS — BP 151/74 | HR 82 | Temp 96.5°F | Resp 18 | Wt 95.3 lb

## 2017-11-05 DIAGNOSIS — D649 Anemia, unspecified: Secondary | ICD-10-CM

## 2017-11-05 LAB — CBC WITH DIFFERENTIAL/PLATELET
BAND NEUTROPHILS: 1 %
BASOS ABS: 0 10*3/uL (ref 0–0.1)
Basophils Relative: 0 %
Blasts: 0 %
EOS ABS: 0 10*3/uL (ref 0–0.7)
Eosinophils Relative: 0 %
HEMATOCRIT: 27 % — AB (ref 35.0–47.0)
Hemoglobin: 9.1 g/dL — ABNORMAL LOW (ref 12.0–16.0)
LYMPHS PCT: 13 %
Lymphs Abs: 1.4 10*3/uL (ref 1.0–3.6)
MCH: 35.8 pg — ABNORMAL HIGH (ref 26.0–34.0)
MCHC: 33.7 g/dL (ref 32.0–36.0)
MCV: 106.2 fL — ABNORMAL HIGH (ref 80.0–100.0)
METAMYELOCYTES PCT: 0 %
MONOS PCT: 22 %
Monocytes Absolute: 2.4 10*3/uL — ABNORMAL HIGH (ref 0.2–0.9)
Myelocytes: 0 %
NEUTROS ABS: 6.9 10*3/uL — AB (ref 1.4–6.5)
Neutrophils Relative %: 64 %
OTHER: 0 %
PROMYELOCYTES RELATIVE: 0 %
Platelets: 155 10*3/uL (ref 150–440)
RBC: 2.54 MIL/uL — AB (ref 3.80–5.20)
RDW: 16.4 % — ABNORMAL HIGH (ref 11.5–14.5)
SMEAR REVIEW: ADEQUATE
WBC: 10.7 10*3/uL (ref 3.6–11.0)
nRBC: 0 /100 WBC

## 2017-11-05 NOTE — Progress Notes (Signed)
Here for follow up. Per pt " I think Im doing ok .Marland KitchenMarland KitchenMarland Kitchenappetite up and down "

## 2018-02-10 NOTE — Progress Notes (Signed)
[place]  Telephone:(336) [phone] Fax:(336) [phone]  ID: Vickie Howard OB: [birth year]  MR#: [id number]  IHW#:[id number]  Patient Care Team: Adin Hector, MD as PCP - General (Internal Medicine)  CHIEF COMPLAINT: Anemia, unspecified.  INTERVAL HISTORY: Patient returns to clinic today for repeat laboratory for further evaluation.  She continues to feel well and remains asymptomatic.  She has chronic weakness and fatigue that is unchanged.  Her son reports increasing memory loss.  She has no neurologic complaints. She denies any recent fevers or illnesses.  She has a fair appetite and denies weight loss.  She denies any chest pain or shortness of breath.  She denies any nausea, vomiting, constipation, or diarrhea.  She has no melena or hematochezia.  She has no urinary complaints.  Patient has no further specific complaints today.  REVIEW OF SYSTEMS:   Review of Systems  Constitutional: Positive for malaise/fatigue. Negative for fever and weight loss.  Respiratory: Negative.  Negative for cough, hemoptysis and shortness of breath.   Cardiovascular: Negative.  Negative for chest pain and leg swelling.  Gastrointestinal: Negative.  Negative for abdominal pain, blood in stool and melena.  Genitourinary: Negative.  Negative for hematuria.  Musculoskeletal: Negative.  Negative for back pain.  Skin: Negative.  Negative for rash.  Neurological: Positive for weakness. Negative for sensory change and focal weakness.  Psychiatric/Behavioral: Positive for memory loss. The patient is not nervous/anxious.     As per HPI. Otherwise, a complete review of systems is negative.  PAST MEDICAL HISTORY: Past Medical History:  Diagnosis Date  . Arthritis   . Atrial fibrillation ([place])   . GERD (gastroesophageal reflux disease)   . Heart murmur   . History of hiatal hernia   . HLD (hyperlipidemia)   . HTN (hypertension)   . Occasional tremors    hands    PAST SURGICAL  HISTORY: Past Surgical History:  Procedure Laterality Date  . JOINT REPLACEMENT     right total hip 2015  . Left total hip arthroplasty  10/12/2014  . PACEMAKER INSERTION N/A 08/12/2015   Procedure: INSERTION PACEMAKER attempt unable to obtain access ;  Surgeon: Isaias Cowman, MD;  Location: [facility] ORS;  Service: Cardiovascular;  Laterality: N/A;  . TOTAL HIP ARTHROPLASTY Left 10/12/2014   Procedure: TOTAL HIP ARTHROPLASTY;  Surgeon: Dereck Leep, MD;  Location: [facility] ORS;  Service: Orthopedics;  Laterality: Left;  . TUBAL LIGATION      FAMILY HISTORY: Family History  Problem Relation Age of Onset  . Aneurysm Father   . Breast cancer Daughter     ADVANCED DIRECTIVES (Y/N):  N  HEALTH MAINTENANCE: Social History   Tobacco Use  . Smoking status: Never Smoker  . Smokeless tobacco: Never Used  Substance Use Topics  . Alcohol use: No  . Drug use: No     Colonoscopy:  PAP:  Bone density:  Lipid panel:  Allergies  Allergen Reactions  . Codeine Sulfate Other (See Comments)    Reaction:  Syncope  . Levaquin [Levofloxacin] Nausea And Vomiting  . Oxycodone Other (See Comments)    Reaction:  Disoriented     Current Outpatient Medications  Medication Sig Dispense Refill  . acetaminophen (TYLENOL) 500 MG tablet Take 1,000 mg by mouth every 6 (six) hours as needed for mild pain or headache.    Marland Kitchen aspirin EC 81 MG tablet Take 81 mg by mouth daily.    Marland Kitchen atorvastatin (LIPITOR) 20 MG tablet Take 20 mg by mouth  daily.    . Cyanocobalamin (VITAMIN B 12 PO) Take 1 mcg by mouth daily.    Marland Kitchen diltiazem (CARDIZEM CD) 120 MG 24 hr capsule   3  . docusate sodium (COLACE) 100 MG capsule Take 100 mg by mouth 2 (two) times daily.    . ferrous sulfate 325 (65 FE) MG tablet Take 325 mg by mouth daily with breakfast.    . LANOXIN 62.5 MCG TABS TK 1 T PO ONCE D  11  . lisinopril (PRINIVIL,ZESTRIL) 2.5 MG tablet   1  . Multiple Vitamins-Minerals (PRESERVISION AREDS 2) CAPS Take 1 capsule by mouth  daily.    Marland Kitchen omeprazole (PRILOSEC) 20 MG capsule Take 20 mg by mouth daily.    . sertraline (ZOLOFT) 50 MG tablet Take 50 mg by mouth daily.    . feeding supplement, ENSURE ENLIVE, (ENSURE ENLIVE) LIQD Take 237 mLs by mouth 2 (two) times daily between meals. (Patient not taking: Reported on 10/15/2017) 237 mL 12  . gabapentin (NEURONTIN) 100 MG capsule Take 100 mg by mouth daily.      No current facility-administered medications for this visit.     OBJECTIVE: Vitals:   02/11/18 1414  BP: 135/63  Pulse: 64  Resp: 18  Temp: (!) 97.2 F (36.2 C)     Body mass index is 18.86 kg/m.    ECOG FS:0 - Asymptomatic  General: Thin, no acute distress. Eyes: Pink conjunctiva, anicteric sclera. HEENT: Normocephalic, moist mucous membranes. Lungs: Clear to auscultation bilaterally. Heart: Regular rate and rhythm. No rubs, murmurs, or gallops. Abdomen: Soft, nontender, nondistended. No organomegaly noted, normoactive bowel sounds. Musculoskeletal: No edema, cyanosis, or clubbing. Neuro: Alert, answering all questions appropriately. Cranial nerves grossly intact. Skin: No rashes or petechiae noted. Psych: Normal affect.  LAB RESULTS:  Lab Results  Component Value Date   NA 140 08/11/2015   K 4.3 08/11/2015   CL 109 08/11/2015   CO2 28 08/11/2015   GLUCOSE 85 08/11/2015   BUN 18 08/11/2015   CREATININE 0.87 08/11/2015   CALCIUM 9.0 08/11/2015   PROT 6.9 10/29/2014   ALBUMIN 3.2 (L) 10/29/2014   AST 18 10/29/2014   ALT 18 10/29/2014   ALKPHOS 85 10/29/2014   BILITOT 0.2 (L) 10/29/2014   GFRNONAA >60 08/11/2015   GFRAA >60 08/11/2015    Lab Results  Component Value Date   WBC 11.6 (H) 02/11/2018   NEUTROABS 7.1 (H) 02/11/2018   HGB 8.6 (L) 02/11/2018   HCT 25.7 (L) 02/11/2018   MCV 106.4 (H) 02/11/2018   PLT 147 (L) 02/11/2018     STUDIES: No results found.  ASSESSMENT: Anemia, unspecified.  PLAN:   1. Anemia, unspecified: Patient's hemoglobin continues to be decreased  at 6.8, which is essentially unchanged for the past several years.  Previously, she was noted to have a high erythropoietin level with decreased reticulocyte count likely indicating a bone marrow disorder such as MDS.  The remainder of her laboratory work was either negative or within normal limits.  We discussed the possibility of a bone marrow biopsy to confirm the diagnosis, but agreed that this is not necessary and an 82 year old female.  Would consider biopsy if patient became more symptomatic or her blood counts continue to drop.  No further interventions are needed.  Return to clinic in 3 months with repeat laboratory work and further evaluation.   2.  Elevated MCV: B12 and folate are within normal limits.  This may be secondary to underlying MDS as above. 3.  Thrombocytopenia: Mild, continue to monitor as above. 4.  Leukocytosis: Likely reactive, but if remains persistently elevated will consider peripheral blood flow cytometry in the future.    Patient expressed understanding and was in agreement with this plan. She also understands that She can call clinic at any time with any questions, concerns, or complaints.    Lloyd Huger, MD   02/12/2018 10:41 AM

## 2018-02-11 ENCOUNTER — Inpatient Hospital Stay: Payer: Medicare Other | Attending: Oncology

## 2018-02-11 ENCOUNTER — Inpatient Hospital Stay (HOSPITAL_BASED_OUTPATIENT_CLINIC_OR_DEPARTMENT_OTHER): Payer: Medicare Other | Admitting: Oncology

## 2018-02-11 ENCOUNTER — Encounter: Payer: Self-pay | Admitting: Oncology

## 2018-02-11 VITALS — BP 135/63 | HR 64 | Temp 97.2°F | Resp 18 | Wt 93.4 lb

## 2018-02-11 DIAGNOSIS — D72829 Elevated white blood cell count, unspecified: Secondary | ICD-10-CM

## 2018-02-11 DIAGNOSIS — D696 Thrombocytopenia, unspecified: Secondary | ICD-10-CM

## 2018-02-11 DIAGNOSIS — D649 Anemia, unspecified: Secondary | ICD-10-CM | POA: Diagnosis present

## 2018-02-11 LAB — CBC WITH DIFFERENTIAL/PLATELET
BASOS ABS: 0 10*3/uL (ref 0–0.1)
BASOS PCT: 0 %
Eosinophils Absolute: 0 10*3/uL (ref 0–0.7)
Eosinophils Relative: 0 %
HEMATOCRIT: 25.7 % — AB (ref 35.0–47.0)
Hemoglobin: 8.6 g/dL — ABNORMAL LOW (ref 12.0–16.0)
LYMPHS ABS: 1.3 10*3/uL (ref 1.0–3.6)
Lymphocytes Relative: 11 %
MCH: 35.5 pg — AB (ref 26.0–34.0)
MCHC: 33.3 g/dL (ref 32.0–36.0)
MCV: 106.4 fL — AB (ref 80.0–100.0)
Monocytes Absolute: 3.2 10*3/uL — ABNORMAL HIGH (ref 0.2–0.9)
Monocytes Relative: 28 %
NEUTROS ABS: 7.1 10*3/uL — AB (ref 1.4–6.5)
Neutrophils Relative %: 61 %
Platelets: 147 10*3/uL — ABNORMAL LOW (ref 150–440)
RBC: 2.42 MIL/uL — ABNORMAL LOW (ref 3.80–5.20)
RDW: 15.5 % — AB (ref 11.5–14.5)
WBC: 11.6 10*3/uL — ABNORMAL HIGH (ref 3.6–11.0)

## 2018-02-11 NOTE — Progress Notes (Signed)
Patient denies any concerns today.  

## 2018-05-12 NOTE — Progress Notes (Signed)
Vickie Howard  [phone]) [phone] Fax:(336) [phone]  ID: Demetrio Lapping OB: 1935-11-21  MR#: [id number]  HQI#:[id number]  Patient Care Team: Adin Hector, MD as PCP - General (Internal Medicine)  CHIEF COMPLAINT: Anemia, unspecified.  INTERVAL HISTORY: Patient returns to clinic today for repeat laboratory work and further evaluation.  She continues to have chronic weakness and fatigue. She has no neurologic complaints. She denies any recent fevers or illnesses.  She continues to have good appetite, but complains of slight weight loss.  She denies any chest pain or shortness of breath.  She denies any nausea, vomiting, constipation, or diarrhea.  She has no melena or hematochezia.  She has no urinary complaints.  Patient feels at her baseline offers no specific complaints today.  REVIEW OF SYSTEMS:   Review of Systems  Constitutional: Positive for malaise/fatigue and weight loss. Negative for fever.  Respiratory: Negative.  Negative for cough, hemoptysis and shortness of breath.   Cardiovascular: Negative.  Negative for chest pain and leg swelling.  Gastrointestinal: Negative.  Negative for abdominal pain, blood in stool and melena.  Genitourinary: Negative.  Negative for hematuria.  Musculoskeletal: Negative.  Negative for back pain.  Skin: Negative.  Negative for rash.  Neurological: Positive for weakness. Negative for sensory change and focal weakness.  Psychiatric/Behavioral: Positive for memory loss. The patient is not nervous/anxious.     As per HPI. Otherwise, a complete review of systems is negative.  PAST MEDICAL HISTORY: Past Medical History:  Diagnosis Date  . Arthritis   . Atrial fibrillation ([place])   . GERD (gastroesophageal reflux disease)   . Heart murmur   . History of hiatal hernia   . HLD (hyperlipidemia)   . HTN (hypertension)   . Occasional tremors    hands    PAST SURGICAL HISTORY: Past Surgical History:  Procedure  Laterality Date  . JOINT REPLACEMENT     right total hip 2015  . Left total hip arthroplasty  10/12/2014  . PACEMAKER INSERTION N/A 08/12/2015   Procedure: INSERTION PACEMAKER attempt unable to obtain access ;  Surgeon: Isaias Cowman, MD;  Location: [facility] ORS;  Service: Cardiovascular;  Laterality: N/A;  . TOTAL HIP ARTHROPLASTY Left 10/12/2014   Procedure: TOTAL HIP ARTHROPLASTY;  Surgeon: Dereck Leep, MD;  Location: [facility] ORS;  Service: Orthopedics;  Laterality: Left;  . TUBAL LIGATION      FAMILY HISTORY: Family History  Problem Relation Age of Onset  . Aneurysm Father   . Breast cancer Daughter     ADVANCED DIRECTIVES (Y/N):  N  HEALTH MAINTENANCE: Social History   Tobacco Use  . Smoking status: Never Smoker  . Smokeless tobacco: Never Used  Substance Use Topics  . Alcohol use: No  . Drug use: No     Colonoscopy:  PAP:  Bone density:  Lipid panel:  Allergies  Allergen Reactions  . Codeine Sulfate Other (See Comments)    Reaction:  Syncope  . Levaquin [Levofloxacin] Nausea And Vomiting  . Oxycodone Other (See Comments)    Reaction:  Disoriented     Current Outpatient Medications  Medication Sig Dispense Refill  . acetaminophen (TYLENOL) 500 MG tablet Take 1,000 mg by mouth every 6 (six) hours as needed for mild pain or headache.    Marland Kitchen aspirin EC 81 MG tablet Take 81 mg by mouth daily.    Marland Kitchen atorvastatin (LIPITOR) 20 MG tablet Take 20 mg by mouth daily.    . Cyanocobalamin (VITAMIN B 12 PO) Take  1 mcg by mouth daily.    Marland Kitchen docusate sodium (COLACE) 100 MG capsule Take 100 mg by mouth 2 (two) times daily.    . feeding supplement, ENSURE ENLIVE, (ENSURE ENLIVE) LIQD Take 237 mLs by mouth 2 (two) times daily between meals. 237 mL 12  . ferrous sulfate 325 (65 FE) MG tablet Take 325 mg by mouth daily with breakfast.    . gabapentin (NEURONTIN) 100 MG capsule Take 100 mg by mouth daily.     Marland Kitchen LANOXIN 62.5 MCG TABS TK 1 T PO ONCE D  11  . lisinopril (PRINIVIL,ZESTRIL)  2.5 MG tablet   1  . Multiple Vitamins-Minerals (PRESERVISION AREDS 2) CAPS Take 1 capsule by mouth daily.    Marland Kitchen omeprazole (PRILOSEC) 20 MG capsule Take 20 mg by mouth daily.    . sertraline (ZOLOFT) 50 MG tablet Take 50 mg by mouth daily. Patient taking 79m daily per Dr. KJens Som    No current facility-administered medications for this visit.     OBJECTIVE: Vitals:   05/13/18 1327 05/13/18 1335  BP:  (!) 179/71  Pulse:  67  Resp: 12   Temp:  98.6 F (37 C)     Body mass index is 18.6 kg/m.    ECOG FS:0 - Asymptomatic  General: Thin, no acute distress. Eyes: Pink conjunctiva, anicteric sclera. HEENT: Normocephalic, moist mucous membranes, clear oropharnyx. Lungs: Clear to auscultation bilaterally. Heart: Regular rate and rhythm. No rubs, murmurs, or gallops. Abdomen: Soft, nontender, nondistended. No organomegaly noted, normoactive bowel sounds. Musculoskeletal: No edema, cyanosis, or clubbing. Neuro: Alert, answering all questions appropriately. Cranial nerves grossly intact. Skin: No rashes or petechiae noted. Psych: Normal affect.  LAB RESULTS:  Lab Results  Component Value Date   NA 140 08/11/2015   K 4.3 08/11/2015   CL 109 08/11/2015   CO2 28 08/11/2015   GLUCOSE 85 08/11/2015   BUN 18 08/11/2015   CREATININE 0.87 08/11/2015   CALCIUM 9.0 08/11/2015   PROT 6.9 10/29/2014   ALBUMIN 3.2 (L) 10/29/2014   AST 18 10/29/2014   ALT 18 10/29/2014   ALKPHOS 85 10/29/2014   BILITOT 0.2 (L) 10/29/2014   GFRNONAA >60 08/11/2015   GFRAA >60 08/11/2015    Lab Results  Component Value Date   WBC 14.2 (H) 05/13/2018   NEUTROABS 8.7 (H) 05/13/2018   HGB 8.1 (L) 05/13/2018   HCT 25.5 (L) 05/13/2018   MCV 105.8 (H) 05/13/2018   PLT 214 05/13/2018     STUDIES: No results found.  ASSESSMENT: Anemia, unspecified.  PLAN:   1. Anemia, unspecified: Patient's hemoglobin continues to be decreased at 8.1, but relatively stable over the past several years.  Her baseline  appears to be between 8.0 and 9.0. Previously, she was noted to have a high erythropoietin level with decreased reticulocyte count likely indicating a bone marrow disorder such as MDS.  The remainder of her laboratory work was either negative or within normal limits.  We again discussed the possibility of a bone marrow biopsy to confirm the diagnosis, but agreed that this is not necessary at this time.  Would consider biopsy if patient became more symptomatic or her blood counts continue to drop.  No further interventions are needed.  Return to clinic in 3 months with repeat laboratory can further evaluation.   2.  Elevated MCV: B12 and folate are within normal limits.  This may be secondary to underlying MDS as above. 3.  Thrombocytopenia: Resolved. 4.  Leukocytosis: Slowly trending up.  Will order peripheral blood flow cytometry as well as an extended myeloid panel with next laboratory draw. 5.  Weight loss: Monitor.  Patient was offered dietary consult which he declined.  She plans to further discuss with primary care.   Patient expressed understanding and was in agreement with this plan. She also understands that She can call clinic at any time with any questions, concerns, or complaints.    Lloyd Huger, MD   05/14/2018 6:49 AM

## 2018-05-13 ENCOUNTER — Inpatient Hospital Stay (HOSPITAL_BASED_OUTPATIENT_CLINIC_OR_DEPARTMENT_OTHER): Payer: Medicare Other | Admitting: Oncology

## 2018-05-13 ENCOUNTER — Other Ambulatory Visit: Payer: Self-pay

## 2018-05-13 ENCOUNTER — Encounter: Payer: Self-pay | Admitting: Oncology

## 2018-05-13 ENCOUNTER — Inpatient Hospital Stay: Payer: Medicare Other | Attending: Oncology

## 2018-05-13 VITALS — BP 179/71 | HR 67 | Temp 98.6°F | Resp 12 | Ht 59.0 in | Wt 92.1 lb

## 2018-05-13 DIAGNOSIS — D649 Anemia, unspecified: Secondary | ICD-10-CM | POA: Diagnosis present

## 2018-05-13 DIAGNOSIS — R634 Abnormal weight loss: Secondary | ICD-10-CM

## 2018-05-13 DIAGNOSIS — R7989 Other specified abnormal findings of blood chemistry: Secondary | ICD-10-CM | POA: Diagnosis not present

## 2018-05-13 DIAGNOSIS — D72829 Elevated white blood cell count, unspecified: Secondary | ICD-10-CM | POA: Insufficient documentation

## 2018-05-13 DIAGNOSIS — M1990 Unspecified osteoarthritis, unspecified site: Secondary | ICD-10-CM | POA: Diagnosis not present

## 2018-05-13 LAB — CBC WITH DIFFERENTIAL/PLATELET
ABS IMMATURE GRANULOCYTES: 0.06 10*3/uL (ref 0.00–0.07)
BASOS ABS: 0 10*3/uL (ref 0.0–0.1)
Basophils Relative: 0 %
EOS ABS: 0 10*3/uL (ref 0.0–0.5)
EOS PCT: 0 %
HEMATOCRIT: 25.5 % — AB (ref 36.0–46.0)
HEMOGLOBIN: 8.1 g/dL — AB (ref 12.0–15.0)
Immature Granulocytes: 0 %
LYMPHS ABS: 1.1 10*3/uL (ref 0.7–4.0)
LYMPHS PCT: 8 %
MCH: 33.6 pg (ref 26.0–34.0)
MCHC: 31.8 g/dL (ref 30.0–36.0)
MCV: 105.8 fL — ABNORMAL HIGH (ref 80.0–100.0)
Monocytes Absolute: 4.4 10*3/uL — ABNORMAL HIGH (ref 0.1–1.0)
Monocytes Relative: 31 %
Neutro Abs: 8.7 10*3/uL — ABNORMAL HIGH (ref 1.7–7.7)
Neutrophils Relative %: 61 %
Platelets: 214 10*3/uL (ref 150–400)
RBC: 2.41 MIL/uL — ABNORMAL LOW (ref 3.87–5.11)
RDW: 15.6 % — ABNORMAL HIGH (ref 11.5–15.5)
WBC: 14.2 10*3/uL — ABNORMAL HIGH (ref 4.0–10.5)
nRBC: 0 % (ref 0.0–0.2)

## 2018-05-13 NOTE — Progress Notes (Signed)
Patient reports occasional tiredness. Her arthritis has worsened since her last visit.

## 2018-05-14 NOTE — Addendum Note (Signed)
Addended by: Wayna Chalet on: 05/14/2018 10:01 AM   Modules accepted: Orders

## 2018-05-21 DIAGNOSIS — M25431 Effusion, right wrist: Secondary | ICD-10-CM | POA: Insufficient documentation

## 2018-06-04 ENCOUNTER — Encounter: Payer: Self-pay | Admitting: Oncology

## 2018-06-04 ENCOUNTER — Inpatient Hospital Stay: Payer: Medicare Other

## 2018-06-04 ENCOUNTER — Inpatient Hospital Stay (HOSPITAL_BASED_OUTPATIENT_CLINIC_OR_DEPARTMENT_OTHER): Payer: Medicare Other | Admitting: Oncology

## 2018-06-04 VITALS — BP 142/78 | HR 72 | Temp 97.7°F | Resp 18 | Ht 59.0 in | Wt 95.8 lb

## 2018-06-04 DIAGNOSIS — D72829 Elevated white blood cell count, unspecified: Secondary | ICD-10-CM | POA: Diagnosis not present

## 2018-06-04 DIAGNOSIS — M1990 Unspecified osteoarthritis, unspecified site: Secondary | ICD-10-CM

## 2018-06-04 DIAGNOSIS — R7989 Other specified abnormal findings of blood chemistry: Secondary | ICD-10-CM

## 2018-06-04 DIAGNOSIS — D649 Anemia, unspecified: Secondary | ICD-10-CM | POA: Diagnosis not present

## 2018-06-04 DIAGNOSIS — R634 Abnormal weight loss: Secondary | ICD-10-CM

## 2018-06-04 LAB — CBC WITH DIFFERENTIAL/PLATELET
Abs Immature Granulocytes: 0.09 10*3/uL — ABNORMAL HIGH (ref 0.00–0.07)
Basophils Absolute: 0 10*3/uL (ref 0.0–0.1)
Basophils Relative: 0 %
EOS ABS: 0 10*3/uL (ref 0.0–0.5)
Eosinophils Relative: 0 %
HEMATOCRIT: 21.8 % — AB (ref 36.0–46.0)
Hemoglobin: 6.9 g/dL — ABNORMAL LOW (ref 12.0–15.0)
Immature Granulocytes: 1 %
LYMPHS ABS: 1.3 10*3/uL (ref 0.7–4.0)
Lymphocytes Relative: 9 %
MCH: 33 pg (ref 26.0–34.0)
MCHC: 31.7 g/dL (ref 30.0–36.0)
MCV: 104.3 fL — ABNORMAL HIGH (ref 80.0–100.0)
Monocytes Absolute: 4.8 10*3/uL — ABNORMAL HIGH (ref 0.1–1.0)
Monocytes Relative: 35 %
Neutro Abs: 7.6 10*3/uL (ref 1.7–7.7)
Neutrophils Relative %: 55 %
Platelets: 217 10*3/uL (ref 150–400)
RBC: 2.09 MIL/uL — ABNORMAL LOW (ref 3.87–5.11)
RDW: 15.7 % — AB (ref 11.5–15.5)
Smear Review: ADEQUATE
WBC: 13.8 10*3/uL — ABNORMAL HIGH (ref 4.0–10.5)
nRBC: 0 % (ref 0.0–0.2)

## 2018-06-04 LAB — PREPARE RBC (CROSSMATCH)

## 2018-06-04 MED ORDER — SODIUM CHLORIDE 0.9% IV SOLUTION
250.0000 mL | Freq: Once | INTRAVENOUS | Status: AC
  Administered 2018-06-04: 250 mL via INTRAVENOUS
  Filled 2018-06-04: qty 250

## 2018-06-04 MED ORDER — DIPHENHYDRAMINE HCL 50 MG/ML IJ SOLN
25.0000 mg | Freq: Once | INTRAMUSCULAR | Status: AC
  Administered 2018-06-04: 25 mg via INTRAVENOUS
  Filled 2018-06-04: qty 1

## 2018-06-04 MED ORDER — ACETAMINOPHEN 325 MG PO TABS
650.0000 mg | ORAL_TABLET | Freq: Once | ORAL | Status: AC
  Administered 2018-06-04: 650 mg via ORAL
  Filled 2018-06-04: qty 2

## 2018-06-04 NOTE — Progress Notes (Signed)
Blood bank called to report positive antibody screen. MD, Dr. Grayland Ormond, notified and aware.

## 2018-06-04 NOTE — Progress Notes (Signed)
[place]  Telephone:(336) [phone] Fax:(336) [phone]  ID: Vickie Howard OB: 01/23/1936  MR#: [id number]  HCW#:[id number]  Patient Care Team: Adin Hector, MD as PCP - General (Internal Medicine)  CHIEF COMPLAINT: Anemia, unspecified.  INTERVAL HISTORY: Patient returns to clinic today as an acute add-on for further discussion of her laboratory work, consideration of transfusion, as well as discussion of bone marrow biopsy. She continues to have chronic weakness and fatigue. She has no neurologic complaints.  She admits to worsening right hip pain as well as significant joint pain in her hands.  She denies any recent fevers or illnesses.  She continues to have good appetite, but complains of slight weight loss.  She denies any chest pain or shortness of breath.  She denies any nausea, vomiting, constipation, or diarrhea.  She has no melena or hematochezia.  She has no urinary complaints.  Patient offers no further specific complaints today.  REVIEW OF SYSTEMS:   Review of Systems  Constitutional: Positive for malaise/fatigue and weight loss. Negative for fever.  Respiratory: Negative.  Negative for cough, hemoptysis and shortness of breath.   Cardiovascular: Negative.  Negative for chest pain and leg swelling.  Gastrointestinal: Negative.  Negative for abdominal pain, blood in stool and melena.  Genitourinary: Negative.  Negative for hematuria.  Musculoskeletal: Positive for joint pain. Negative for back pain.  Skin: Negative.  Negative for rash.  Neurological: Positive for weakness. Negative for sensory change and focal weakness.  Psychiatric/Behavioral: Positive for memory loss. The patient is not nervous/anxious.     As per HPI. Otherwise, a complete review of systems is negative.  PAST MEDICAL HISTORY: Past Medical History:  Diagnosis Date  . Anemia   . Arthritis   . Atrial fibrillation ([place])   . GERD (gastroesophageal reflux disease)   . Heart  murmur   . History of hiatal hernia   . HLD (hyperlipidemia)   . HTN (hypertension)   . Occasional tremors    hands    PAST SURGICAL HISTORY: Past Surgical History:  Procedure Laterality Date  . JOINT REPLACEMENT     right total hip 2015  . Left total hip arthroplasty  10/12/2014  . PACEMAKER INSERTION N/A 08/12/2015   Procedure: INSERTION PACEMAKER attempt unable to obtain access ;  Surgeon: Isaias Cowman, MD;  Location: [facility] ORS;  Service: Cardiovascular;  Laterality: N/A;  . TOTAL HIP ARTHROPLASTY Left 10/12/2014   Procedure: TOTAL HIP ARTHROPLASTY;  Surgeon: Dereck Leep, MD;  Location: [facility] ORS;  Service: Orthopedics;  Laterality: Left;  . TUBAL LIGATION      FAMILY HISTORY: Family History  Problem Relation Age of Onset  . Aneurysm Father   . Breast cancer Daughter     ADVANCED DIRECTIVES (Y/N):  N  HEALTH MAINTENANCE: Social History   Tobacco Use  . Smoking status: Never Smoker  . Smokeless tobacco: Never Used  Substance Use Topics  . Alcohol use: No  . Drug use: No     Colonoscopy:  PAP:  Bone density:  Lipid panel:  Allergies  Allergen Reactions  . Codeine Sulfate Other (See Comments)    Reaction:  Syncope  . Levaquin [Levofloxacin] Nausea And Vomiting  . Oxycodone Other (See Comments)    Reaction:  Disoriented     Current Outpatient Medications  Medication Sig Dispense Refill  . acetaminophen (TYLENOL) 500 MG tablet Take 1,000 mg by mouth every 6 (six) hours as needed for mild pain or headache.    Marland Kitchen aspirin  EC 81 MG tablet Take 81 mg by mouth every other day.     Marland Kitchen atorvastatin (LIPITOR) 20 MG tablet Take 20 mg by mouth daily.    . Cyanocobalamin (VITAMIN B 12 PO) Take 1 mcg by mouth daily.    Marland Kitchen docusate sodium (COLACE) 100 MG capsule Take 100 mg by mouth 2 (two) times daily.    . feeding supplement, ENSURE ENLIVE, (ENSURE ENLIVE) LIQD Take 237 mLs by mouth 2 (two) times daily between meals. 237 mL 12  . ferrous sulfate 325 (65 FE) MG tablet  Take 325 mg by mouth daily with breakfast.    . LANOXIN 62.5 MCG TABS TK 1 T PO ONCE D  11  . lisinopril (PRINIVIL,ZESTRIL) 2.5 MG tablet Take 2.5 mg by mouth daily.   1  . meloxicam (MOBIC) 7.5 MG tablet Take 7.5 mg by mouth daily.    . Multiple Vitamins-Minerals (PRESERVISION AREDS 2) CAPS Take 1 capsule by mouth daily.    Marland Kitchen omeprazole (PRILOSEC) 20 MG capsule Take 20 mg by mouth daily.    . sertraline (ZOLOFT) 50 MG tablet Take 50 mg by mouth daily. Patient taking '75mg'$  daily per Dr. Jens Som    . gabapentin (NEURONTIN) 100 MG capsule Take 100 mg by mouth daily.      No current facility-administered medications for this visit.     OBJECTIVE: Vitals:   06/04/18 1045  BP: (!) 142/78  Pulse: 72  Resp: 18  Temp: 97.7 F (36.5 C)     Body mass index is 19.35 kg/m.    ECOG FS:0 - Asymptomatic  General: Thin, no acute distress. Eyes: Pink conjunctiva, anicteric sclera. HEENT: Normocephalic, moist mucous membranes, clear oropharnyx. Lungs: Clear to auscultation bilaterally. Heart: Regular rate and rhythm. No rubs, murmurs, or gallops. Abdomen: Soft, nontender, nondistended. No organomegaly noted, normoactive bowel sounds. Musculoskeletal: No edema, cyanosis, or clubbing. Neuro: Alert, answering all questions appropriately. Cranial nerves grossly intact. Skin: No rashes or petechiae noted. Psych: Normal affect.  LAB RESULTS:  Lab Results  Component Value Date   NA 140 08/11/2015   K 4.3 08/11/2015   CL 109 08/11/2015   CO2 28 08/11/2015   GLUCOSE 85 08/11/2015   BUN 18 08/11/2015   CREATININE 0.87 08/11/2015   CALCIUM 9.0 08/11/2015   PROT 6.9 10/29/2014   ALBUMIN 3.2 (L) 10/29/2014   AST 18 10/29/2014   ALT 18 10/29/2014   ALKPHOS 85 10/29/2014   BILITOT 0.2 (L) 10/29/2014   GFRNONAA >60 08/11/2015   GFRAA >60 08/11/2015    Lab Results  Component Value Date   WBC 13.8 (H) 06/04/2018   NEUTROABS 7.6 06/04/2018   HGB 6.9 (L) 06/04/2018   HCT 21.8 (L) 06/04/2018    MCV 104.3 (H) 06/04/2018   PLT 217 06/04/2018     STUDIES: No results found.  ASSESSMENT: Anemia, unspecified.  PLAN:   1. Anemia, unspecified: Patient's hemoglobin is trending down and is now 6.9.  She is also more symptomatic, therefore will proceed with 1 unit of blood today.  Previously, she was noted to have a high erythropoietin level with decreased reticulocyte count likely indicating a bone marrow disorder such as MDS.  Although given her increasing white blood cell count with monocytic predominance, CMML is a distinct possibility as well.  Patient has now agreed to a bone marrow biopsy.  Return to clinic 1 week after biopsy to discuss the results and consideration of additional blood if necessary.   2.  Elevated MCV: B12 and  folate are within normal limits.  This may be secondary to underlying MDS or CMML as above. 3.  Thrombocytopenia: Resolved. 4.  Leukocytosis: Slowly trending up.  Peripheral blood flow cytometry as well as extended myeloid panel was ordered and is pending at time of dictation.  Bone marrow biopsy as above. 5.  Weight loss: Monitor.  Patient was previously offered dietary consult which he declined.  She plans to further discuss with primary care. 6.  Joint pain: Unclear etiology.  Continue monitoring and treatment per primary care.   Patient expressed understanding and was in agreement with this plan. She also understands that She can call clinic at any time with any questions, concerns, or complaints.    Lloyd Huger, MD   06/05/2018 9:35 AM

## 2018-06-04 NOTE — Progress Notes (Signed)
Pt here for anemia, she states she has hip pain from surgery and was started on meloxicam. Also has right hand and right foot at times.

## 2018-06-06 LAB — BPAM RBC
Blood Product Expiration Date: 202001132359
ISSUE DATE / TIME: 201912311545
Unit Type and Rh: 600

## 2018-06-06 LAB — TYPE AND SCREEN
ABO/RH(D): A NEG
Antibody Screen: POSITIVE
UNIT DIVISION: 0

## 2018-06-07 ENCOUNTER — Telehealth: Payer: Self-pay

## 2018-06-07 NOTE — Telephone Encounter (Signed)
Called daughter, [name] to let her know about her mother's biopsy appointment date, time and location. [name] requested to contact her at all times before calling her mother. This had been documented on patient's demographics. [name] was also told that one of the IR nurses would contact her with instructions. There was no further questions.

## 2018-06-07 NOTE — Telephone Encounter (Signed)
I have Pt Vickie Howard [id number] scheduled for her Bone Marrow Bx on Tues 06/18/2018 at 8:30a and she needs to arrive at 7:30a.  The IR Nurse will call her with all the details a day or two before the procedure.  Thx    Called patient to let her know

## 2018-06-11 LAB — COMP PANEL: LEUKEMIA/LYMPHOMA

## 2018-06-17 ENCOUNTER — Other Ambulatory Visit: Payer: Self-pay | Admitting: Radiology

## 2018-06-18 ENCOUNTER — Telehealth: Payer: Self-pay | Admitting: *Deleted

## 2018-06-18 ENCOUNTER — Other Ambulatory Visit: Payer: Self-pay | Admitting: *Deleted

## 2018-06-18 ENCOUNTER — Other Ambulatory Visit: Payer: Self-pay

## 2018-06-18 ENCOUNTER — Ambulatory Visit
Admission: RE | Admit: 2018-06-18 | Discharge: 2018-06-18 | Disposition: A | Discharge status: Home / Self Care | Payer: Medicare Other | Source: Ambulatory Visit | Attending: Oncology | Admitting: Oncology

## 2018-06-18 ENCOUNTER — Other Ambulatory Visit (HOSPITAL_COMMUNITY)
Admission: RE | Admit: 2018-06-18 | Disposition: A | Discharge status: Home / Self Care | Payer: Medicare Other | Source: Ambulatory Visit | Attending: Oncology | Admitting: Oncology

## 2018-06-18 DIAGNOSIS — Z791 Long term (current) use of non-steroidal anti-inflammatories (NSAID): Secondary | ICD-10-CM | POA: Insufficient documentation

## 2018-06-18 DIAGNOSIS — Z7982 Long term (current) use of aspirin: Secondary | ICD-10-CM | POA: Diagnosis not present

## 2018-06-18 DIAGNOSIS — I4891 Unspecified atrial fibrillation: Secondary | ICD-10-CM | POA: Insufficient documentation

## 2018-06-18 DIAGNOSIS — Z96643 Presence of artificial hip joint, bilateral: Secondary | ICD-10-CM | POA: Diagnosis not present

## 2018-06-18 DIAGNOSIS — D649 Anemia, unspecified: Secondary | ICD-10-CM

## 2018-06-18 DIAGNOSIS — D7589 Other specified diseases of blood and blood-forming organs: Secondary | ICD-10-CM | POA: Insufficient documentation

## 2018-06-18 DIAGNOSIS — K219 Gastro-esophageal reflux disease without esophagitis: Secondary | ICD-10-CM | POA: Insufficient documentation

## 2018-06-18 DIAGNOSIS — D72821 Monocytosis (symptomatic): Secondary | ICD-10-CM | POA: Insufficient documentation

## 2018-06-18 DIAGNOSIS — Z881 Allergy status to other antibiotic agents status: Secondary | ICD-10-CM | POA: Diagnosis not present

## 2018-06-18 DIAGNOSIS — Z950 Presence of cardiac pacemaker: Secondary | ICD-10-CM | POA: Insufficient documentation

## 2018-06-18 DIAGNOSIS — Z885 Allergy status to narcotic agent status: Secondary | ICD-10-CM | POA: Insufficient documentation

## 2018-06-18 DIAGNOSIS — E785 Hyperlipidemia, unspecified: Secondary | ICD-10-CM | POA: Insufficient documentation

## 2018-06-18 DIAGNOSIS — Z79899 Other long term (current) drug therapy: Secondary | ICD-10-CM | POA: Insufficient documentation

## 2018-06-18 DIAGNOSIS — I10 Essential (primary) hypertension: Secondary | ICD-10-CM | POA: Diagnosis not present

## 2018-06-18 LAB — CBC WITH DIFFERENTIAL/PLATELET
Abs Immature Granulocytes: 0.16 10*3/uL — ABNORMAL HIGH (ref 0.00–0.07)
BASOS ABS: 0 10*3/uL (ref 0.0–0.1)
Basophils Relative: 0 %
Eosinophils Absolute: 0 10*3/uL (ref 0.0–0.5)
Eosinophils Relative: 0 %
HCT: 27 % — ABNORMAL LOW (ref 36.0–46.0)
Hemoglobin: 8.5 g/dL — ABNORMAL LOW (ref 12.0–15.0)
Immature Granulocytes: 1 %
Lymphocytes Relative: 8 %
Lymphs Abs: 1.3 10*3/uL (ref 0.7–4.0)
MCH: 30.9 pg (ref 26.0–34.0)
MCHC: 31.5 g/dL (ref 30.0–36.0)
MCV: 98.2 fL (ref 80.0–100.0)
Monocytes Absolute: 3.9 10*3/uL — ABNORMAL HIGH (ref 0.1–1.0)
Monocytes Relative: 26 %
NRBC: 0 % (ref 0.0–0.2)
Neutro Abs: 9.8 10*3/uL — ABNORMAL HIGH (ref 1.7–7.7)
Neutrophils Relative %: 65 %
Platelets: 177 10*3/uL (ref 150–400)
RBC: 2.75 MIL/uL — ABNORMAL LOW (ref 3.87–5.11)
RDW: 18.7 % — AB (ref 11.5–15.5)
Smear Review: NORMAL
WBC: 15.2 10*3/uL — ABNORMAL HIGH (ref 4.0–10.5)

## 2018-06-18 LAB — PROTIME-INR
INR: 1.05
Prothrombin Time: 13.6 seconds (ref 11.4–15.2)

## 2018-06-18 MED ORDER — FENTANYL CITRATE (PF) 100 MCG/2ML IJ SOLN
INTRAMUSCULAR | Status: AC | PRN
  Administered 2018-06-18 (×3): 25 ug via INTRAVENOUS

## 2018-06-18 MED ORDER — HEPARIN SOD (PORK) LOCK FLUSH 100 UNIT/ML IV SOLN
INTRAVENOUS | Status: AC
  Filled 2018-06-18: qty 5

## 2018-06-18 MED ORDER — FENTANYL CITRATE (PF) 100 MCG/2ML IJ SOLN
INTRAMUSCULAR | Status: AC
  Filled 2018-06-18: qty 2

## 2018-06-18 MED ORDER — MIDAZOLAM HCL 2 MG/2ML IJ SOLN
INTRAMUSCULAR | Status: AC | PRN
  Administered 2018-06-18 (×3): 0.5 mg via INTRAVENOUS

## 2018-06-18 MED ORDER — SODIUM CHLORIDE 0.9 % IV SOLN
INTRAVENOUS | Status: DC
  Administered 2018-06-18: 08:00:00 via INTRAVENOUS

## 2018-06-18 MED ORDER — MIDAZOLAM HCL 2 MG/2ML IJ SOLN
INTRAMUSCULAR | Status: AC
  Filled 2018-06-18: qty 2

## 2018-06-18 NOTE — Progress Notes (Signed)
cbc

## 2018-06-18 NOTE — H&P (Signed)
Chief Complaint:  chronic anemia  Referring Physician(s): Finnegan,Timothy J   History of Present Illness: Vickie Howard is a 83 y.o. female with chronic anemia now requiring blood transfusions.  She is having anemia symptoms with fatigue and SOB. Plan for CT BM asp and core bx today as an outpt for the anemia workup.   Past Medical History:  Diagnosis Date  . Anemia   . Arthritis   . Atrial fibrillation ([place])   . GERD (gastroesophageal reflux disease)   . Heart murmur   . History of hiatal hernia   . HLD (hyperlipidemia)   . HTN (hypertension)   . Occasional tremors    hands    Past Surgical History:  Procedure Laterality Date  . JOINT REPLACEMENT     right total hip 2015  . Left total hip arthroplasty  10/12/2014  . PACEMAKER INSERTION N/A 08/12/2015   Procedure: INSERTION PACEMAKER attempt unable to obtain access ;  Surgeon: Isaias Cowman, MD;  Location: [facility] ORS;  Service: Cardiovascular;  Laterality: N/A;  . TOTAL HIP ARTHROPLASTY Left 10/12/2014   Procedure: TOTAL HIP ARTHROPLASTY;  Surgeon: Dereck Leep, MD;  Location: [facility] ORS;  Service: Orthopedics;  Laterality: Left;  . TUBAL LIGATION      Allergies: Codeine sulfate; Levaquin [levofloxacin]; and Oxycodone  Medications: Prior to Admission medications   Medication Sig Start Date End Date Taking? Authorizing Provider  acetaminophen (TYLENOL) 500 MG tablet Take 1,000 mg by mouth every 6 (six) hours as needed for mild pain or headache.   Yes [provider]  aspirin EC 81 MG tablet Take 81 mg by mouth every other day.    Yes [provider]  atorvastatin (LIPITOR) 20 MG tablet Take 20 mg by mouth daily.   Yes [provider]  Cyanocobalamin (VITAMIN B 12 PO) Take 1 mcg by mouth daily.   Yes [provider]  docusate sodium (COLACE) 100 MG capsule Take 100 mg by mouth 2 (two) times daily.   Yes [provider]  feeding supplement, ENSURE ENLIVE, (ENSURE  ENLIVE) LIQD Take 237 mLs by mouth 2 (two) times daily between meals. 08/13/15  Yes Mody, Ulice Bold, MD  ferrous sulfate 325 (65 FE) MG tablet Take 325 mg by mouth daily with breakfast.   Yes [provider]  gabapentin (NEURONTIN) 100 MG capsule Take 100 mg by mouth daily.    Yes [provider]  LANOXIN 62.5 MCG TABS TK 1 T PO ONCE D 01/16/18  Yes [provider]  lisinopril (PRINIVIL,ZESTRIL) 2.5 MG tablet Take 2.5 mg by mouth daily.  01/03/18  Yes [provider]  meloxicam (MOBIC) 7.5 MG tablet Take 7.5 mg by mouth daily.   Yes [provider]  Multiple Vitamins-Minerals (PRESERVISION AREDS 2) CAPS Take 1 capsule by mouth daily.   Yes [provider]  omeprazole (PRILOSEC) 20 MG capsule Take 20 mg by mouth daily.   Yes [provider]  sertraline (ZOLOFT) 50 MG tablet Take 75 mg by mouth daily. Patient taking 75mg  daily per Dr. Jens Som    Yes [provider]     Family History  Problem Relation Age of Onset  . Aneurysm Father   . Breast cancer Daughter     Social History   Socioeconomic History  . Marital status: Widowed    Spouse name: Not on file  . Number of children: Not on file  . Years of education: Not on file  . Highest  education level: Not on file  Occupational History  . Not on file  Social Needs  . Financial resource strain: Not on file  . Food insecurity:    Worry: Not on file    Inability: Not on file  . Transportation needs:    Medical: Not on file    Non-medical: Not on file  Tobacco Use  . Smoking status: Never Smoker  . Smokeless tobacco: Never Used  Substance and Sexual Activity  . Alcohol use: No  . Drug use: No  . Sexual activity: Never  Lifestyle  . Physical activity:    Days per week: Not on file    Minutes per session: Not on file  . Stress: Not on file  Relationships  . Social connections:    Talks on phone: Not on file    Gets together: Not on file    Attends religious  service: Not on file    Active member of club or organization: Not on file    Attends meetings of clubs or organizations: Not on file    Relationship status: Not on file  Other Topics Concern  . Not on file  Social History Narrative  . Not on file       Review of Systems: A 12 point ROS discussed and pertinent positives are indicated in the HPI above.  All other systems are negative.  Review of Systems  Vital Signs: BP (!) 171/65   Pulse 80   Temp 98.1 F (36.7 C) (Oral)   Resp 20   SpO2 97%   Physical Exam Constitutional:      General: She is not in acute distress.    Appearance: Normal appearance. She is not toxic-appearing.  Eyes:     General: No scleral icterus.    Conjunctiva/sclera: Conjunctivae normal.  Cardiovascular:     Rate and Rhythm: Normal rate and regular rhythm.     Heart sounds: Murmur present.  Pulmonary:     Effort: Pulmonary effort is normal. No respiratory distress.     Breath sounds: Normal breath sounds.  Abdominal:     General: Abdomen is flat. Bowel sounds are normal. There is no distension.  Musculoskeletal: Normal range of motion.  Skin:    General: Skin is warm and dry.  Neurological:     General: No focal deficit present.     Mental Status: She is alert.  Psychiatric:        Mood and Affect: Mood normal.     Imaging: No results found.  Labs:  CBC: Recent Labs    02/11/18 1352 05/13/18 1233 06/04/18 1200 06/18/18 0801  WBC 11.6* 14.2* 13.8* 15.2*  HGB 8.6* 8.1* 6.9* 8.5*  HCT 25.7* 25.5* 21.8* 27.0*  PLT 147* 214 217 177    COAGS: No results for input(s): INR, APTT in the last 8760 hours.  BMP: No results for input(s): NA, K, CL, CO2, GLUCOSE, BUN, CALCIUM, CREATININE, GFRNONAA, GFRAA in the last 8760 hours.  Invalid input(s): CMP  LIVER FUNCTION TESTS: No results for input(s): BILITOT, AST, ALT, ALKPHOS, PROT, ALBUMIN in the last 8760 hours.  TUMOR MARKERS: No results for input(s): AFPTM, CEA, CA199,  CHROMGRNA in the last 8760 hours.  Assessment and Plan:  Chronic unexplained anemia now symptomatic requiring a few transfusions.  Concern for MDS or other lymphoproliferative porcess.  Plan for CT BM asp and core bx today.  Risks and benefits discussed with the patient including, but not limited to bleeding, infection, damage to adjacent structures  or low yield requiring additional tests.  All of the patient's questions were answered, patient is agreeable to proceed. Consent signed and in chart.    Thank you for this interesting consult.  I greatly enjoyed meeting EFFIE WAHLERT and look forward to participating in their care.  A copy of this report was sent to the requesting provider on this date.  Electronically Signed: Greggory Keen, MD 06/18/2018, 8:34 AM   I spent a total of  30 Minutes   in face to face in clinical consultation, greater than 50% of which was counseling/coordinating care for this pt with anemia

## 2018-06-18 NOTE — Telephone Encounter (Signed)
Bone marrow biopsy performed today, results will not be available until next week. Can we please schedule patient for lab/see Woodfin Ganja early next week? Thanks

## 2018-06-18 NOTE — Telephone Encounter (Signed)
[name] called asking if there is a chance that results form BM Bx today will be back this week. Also inquiring what is to happen next. side effects asks for a return call [phone]

## 2018-06-18 NOTE — Procedures (Signed)
Anemia  S/p CT BM ASP AND CORE BX  No comp Stable EBL min Path pending Full report in pacs

## 2018-06-19 NOTE — Telephone Encounter (Signed)
I called daughter and she repeated appointment for 1/21 @915  back to me

## 2018-06-21 ENCOUNTER — Telehealth: Payer: Self-pay

## 2018-06-21 NOTE — Telephone Encounter (Signed)
Received a message to call Vickie Howard [phone]) in reference to patient's ICD 10. When I called her back, I had to leave her a voicemail with the information requested and a return phone number in case she needed additional information.  Patient's ICD 10: Anemia, unspecified D64.9

## 2018-06-23 DIAGNOSIS — C9310 Chronic myelomonocytic leukemia not having achieved remission: Secondary | ICD-10-CM | POA: Insufficient documentation

## 2018-06-23 NOTE — Progress Notes (Signed)
[place]  [phone] [phone]  ID: Vickie Howard OB: 1935-09-28  MR#: [id number]  SAY#:[id number]  Patient Care Team: Adin Hector, MD as PCP - General (Internal Medicine)  CHIEF COMPLAINT: CMML  INTERVAL HISTORY: Patient returns to clinic today for further evaluation, discussion of her bone marrow biopsy results, and treatment planning.  She continues to have chronic weakness and fatigue, but otherwise feels well.  She has no neurologic complaints.  She continues to have hand pain and swelling.  She denies any recent fevers or illnesses.  She continues to have good appetite, but complains of slight weight loss.  She denies any chest pain or shortness of breath.  She denies any nausea, vomiting, constipation, or diarrhea.  She has no melena or hematochezia.  She has no urinary complaints.  Patient offers no further specific complaints today.  REVIEW OF SYSTEMS:   Review of Systems  Constitutional: Positive for malaise/fatigue and weight loss. Negative for fever.  Respiratory: Negative.  Negative for cough, hemoptysis and shortness of breath.   Cardiovascular: Negative.  Negative for chest pain and leg swelling.  Gastrointestinal: Negative.  Negative for abdominal pain, blood in stool and melena.  Genitourinary: Negative.  Negative for hematuria.  Musculoskeletal: Positive for joint pain. Negative for back pain.  Skin: Negative.  Negative for rash.  Neurological: Positive for weakness. Negative for sensory change and focal weakness.  Psychiatric/Behavioral: Positive for memory loss. The patient is not nervous/anxious.     As per HPI. Otherwise, a complete review of systems is negative.  PAST MEDICAL HISTORY: Past Medical History:  Diagnosis Date  . Anemia   . Arthritis   . Atrial fibrillation ([place])   . GERD (gastroesophageal reflux disease)   . Heart murmur   . History of hiatal hernia   . HLD (hyperlipidemia)   . HTN  (hypertension)   . Occasional tremors    hands    PAST SURGICAL HISTORY: Past Surgical History:  Procedure Laterality Date  . JOINT REPLACEMENT     right total hip 2015  . Left total hip arthroplasty  10/12/2014  . PACEMAKER INSERTION N/A 08/12/2015   Procedure: INSERTION PACEMAKER attempt unable to obtain access ;  Surgeon: Isaias Cowman, MD;  Location: [facility] ORS;  Service: Cardiovascular;  Laterality: N/A;  . TOTAL HIP ARTHROPLASTY Left 10/12/2014   Procedure: TOTAL HIP ARTHROPLASTY;  Surgeon: Dereck Leep, MD;  Location: [facility] ORS;  Service: Orthopedics;  Laterality: Left;  . TUBAL LIGATION      FAMILY HISTORY: Family History  Problem Relation Age of Onset  . Aneurysm Father   . Breast cancer Daughter     ADVANCED DIRECTIVES (Y/N):  N  HEALTH MAINTENANCE: Social History   Tobacco Use  . Smoking status: Never Smoker  . Smokeless tobacco: Never Used  Substance Use Topics  . Alcohol use: No  . Drug use: No     Colonoscopy:  PAP:  Bone density:  Lipid panel:  Allergies  Allergen Reactions  . Codeine Sulfate Other (See Comments)    Reaction:  Syncope  . Levaquin [Levofloxacin] Nausea And Vomiting  . Oxycodone Other (See Comments)    Reaction:  Disoriented     Current Outpatient Medications  Medication Sig Dispense Refill  . acetaminophen (TYLENOL) 500 MG tablet Take 1,000 mg by mouth every 6 (six) hours as needed for mild pain or headache.    Marland Kitchen aspirin EC 81 MG tablet Take 81 mg by mouth every other day.     Marland Kitchen  atorvastatin (LIPITOR) 20 MG tablet Take 20 mg by mouth daily.    . Cyanocobalamin (VITAMIN B 12 PO) Take 1 mcg by mouth daily.    Marland Kitchen docusate sodium (COLACE) 100 MG capsule Take 100 mg by mouth 2 (two) times daily.    . feeding supplement, ENSURE ENLIVE, (ENSURE ENLIVE) LIQD Take 237 mLs by mouth 2 (two) times daily between meals. 237 mL 12  . ferrous sulfate 325 (65 FE) MG tablet Take 325 mg by mouth daily with breakfast.    . gabapentin (NEURONTIN)  100 MG capsule Take 100 mg by mouth daily.     Marland Kitchen LANOXIN 62.5 MCG TABS TK 1 T PO ONCE D  11  . lisinopril (PRINIVIL,ZESTRIL) 2.5 MG tablet Take 2.5 mg by mouth daily.   1  . meloxicam (MOBIC) 7.5 MG tablet Take 7.5 mg by mouth daily.    . Multiple Vitamins-Minerals (PRESERVISION AREDS 2) CAPS Take 1 capsule by mouth daily.    Marland Kitchen omeprazole (PRILOSEC) 20 MG capsule Take 20 mg by mouth daily.    . sertraline (ZOLOFT) 50 MG tablet Take 75 mg by mouth daily. Patient taking '75mg'$  daily per Dr. Jens Som      No current facility-administered medications for this visit.     OBJECTIVE: Vitals:   06/25/18 0930  BP: (!) 146/72  Pulse: 76  Resp: 18  Temp: (!) 97.5 F (36.4 C)     Body mass index is 18.46 kg/m.    ECOG FS:0 - Asymptomatic  General: Thin, no acute distress. Eyes: Pink conjunctiva, anicteric sclera. HEENT: Normocephalic, moist mucous membranes, clear oropharnyx. Lungs: Clear to auscultation bilaterally. Heart: Regular rate and rhythm. No rubs, murmurs, or gallops. Abdomen: Soft, nontender, nondistended. No organomegaly noted, normoactive bowel sounds. Musculoskeletal: Mild hand swelling, right greater than left. Neuro: Alert, answering all questions appropriately. Cranial nerves grossly intact. Skin: No rashes or petechiae noted. Psych: Normal affect.  LAB RESULTS:  Lab Results  Component Value Date   NA 140 08/11/2015   K 4.3 08/11/2015   CL 109 08/11/2015   CO2 28 08/11/2015   GLUCOSE 85 08/11/2015   BUN 18 08/11/2015   CREATININE 0.87 08/11/2015   CALCIUM 9.0 08/11/2015   PROT 6.9 10/29/2014   ALBUMIN 3.2 (L) 10/29/2014   AST 18 10/29/2014   ALT 18 10/29/2014   ALKPHOS 85 10/29/2014   BILITOT 0.2 (L) 10/29/2014   GFRNONAA >60 08/11/2015   GFRAA >60 08/11/2015    Lab Results  Component Value Date   WBC 16.5 (H) 06/25/2018   NEUTROABS 10.1 (H) 06/25/2018   HGB 9.2 (L) 06/25/2018   HCT 29.5 (L) 06/25/2018   MCV 97.7 06/25/2018   PLT 186 06/25/2018      STUDIES: Ct Bone Marrow Biopsy & Aspiration  Result Date: 06/18/2018 INDICATION: Chronic unspecified anemia EXAM: CT GUIDED RIGHT ILIAC BONE MARROW ASPIRATION AND CORE BIOPSY Date:  06/18/2018 06/18/2018 9:43 am Radiologist:  Jerilynn Mages. Daryll Brod, MD Guidance:  CT FLUOROSCOPY TIME:  Fluoroscopy Time: None. MEDICATIONS: 1% lidocaine local ANESTHESIA/SEDATION: 1.5 mg IV Versed; 75 mcg IV Fentanyl Moderate Sedation Time:  13 minutes The patient was continuously monitored during the procedure by the interventional radiology nurse under my direct supervision. CONTRAST:  None COMPLICATIONS: None PROCEDURE: Informed consent was obtained from the patient following explanation of the procedure, risks, benefits and alternatives. The patient understands, agrees and consents for the procedure. All questions were addressed. A time out was performed. The patient was positioned prone and non-contrast localization CT  was performed of the pelvis to demonstrate the iliac marrow spaces. Maximal barrier sterile technique utilized including caps, mask, sterile gowns, sterile gloves, large sterile drape, hand hygiene, and Betadine prep. Under sterile conditions and local anesthesia, an 11 gauge coaxial bone biopsy needle was advanced into the right iliac marrow space. Needle position was confirmed with CT imaging. Initially, bone marrow aspiration was performed. Next, the 11 gauge outer cannula was utilized to obtain a right iliac bone marrow core biopsy. Needle was removed. Hemostasis was obtained with compression. The patient tolerated the procedure well. Samples were prepared with the cytotechnologist. No immediate complications. IMPRESSION: CT guided right iliac bone marrow aspiration and core biopsy. Electronically Signed   By: Jerilynn Mages.  Shick M.D.   On: 06/18/2018 09:56    ASSESSMENT: CMML.  PLAN:   1. CMML: Confirmed on bone marrow biopsy from June 18, 2018.  Patient noted to have a hypercellular marrow with mild dysplasia  in all 3 lineages.  No increased blasts.  Peripheral blood also notes an absolute monocytosis of 4.4.  Given patient's advanced age and she is relatively asymptomatic, would not pursue treatment at this time with either Vidaza or Dacogen, but could reconsider in the future.  Will continue supportive care with Retacrit injections and blood transfusions as needed. 2.  Anemia: Secondary to CMML.  Continue supportive care every 4 weeks with laboratory work and Retacrit if needed.  Patient will return to clinic in 3 months for further evaluation.  3.  Elevated MCV: B12 and folate are within normal limits.  Secondary to CMML.   3.  Thrombocytopenia: Resolved. 4.  Leukocytosis: Secondary to CMML.   5.  Weight loss: Monitor.  Patient was previously offered dietary consult which he declined.  She plans to further discuss with primary care. 6.  Joint pain: Unclear etiology.  Continue monitoring and treatment per primary care.  Patient reports that she was given a referral to rheumatology which she was encouraged to keep.   Patient expressed understanding and was in agreement with this plan. She also understands that She can call clinic at any time with any questions, concerns, or complaints.    Lloyd Huger, MD   06/26/2018 9:02 AM

## 2018-06-25 ENCOUNTER — Inpatient Hospital Stay: Payer: Medicare Other

## 2018-06-25 ENCOUNTER — Encounter: Payer: Self-pay | Admitting: Oncology

## 2018-06-25 ENCOUNTER — Inpatient Hospital Stay: Payer: Medicare Other | Attending: Oncology

## 2018-06-25 ENCOUNTER — Inpatient Hospital Stay (HOSPITAL_BASED_OUTPATIENT_CLINIC_OR_DEPARTMENT_OTHER): Payer: Medicare Other | Admitting: Oncology

## 2018-06-25 ENCOUNTER — Other Ambulatory Visit: Payer: Self-pay

## 2018-06-25 DIAGNOSIS — C9310 Chronic myelomonocytic leukemia not having achieved remission: Secondary | ICD-10-CM | POA: Diagnosis not present

## 2018-06-25 DIAGNOSIS — D630 Anemia in neoplastic disease: Secondary | ICD-10-CM

## 2018-06-25 DIAGNOSIS — D649 Anemia, unspecified: Secondary | ICD-10-CM

## 2018-06-25 LAB — CBC WITH DIFFERENTIAL/PLATELET
Abs Immature Granulocytes: 0.11 10*3/uL — ABNORMAL HIGH (ref 0.00–0.07)
Basophils Absolute: 0 10*3/uL (ref 0.0–0.1)
Basophils Relative: 0 %
Eosinophils Absolute: 0 10*3/uL (ref 0.0–0.5)
Eosinophils Relative: 0 %
HCT: 29.5 % — ABNORMAL LOW (ref 36.0–46.0)
Hemoglobin: 9.2 g/dL — ABNORMAL LOW (ref 12.0–15.0)
Immature Granulocytes: 1 %
LYMPHS PCT: 12 %
Lymphs Abs: 1.9 10*3/uL (ref 0.7–4.0)
MCH: 30.5 pg (ref 26.0–34.0)
MCHC: 31.2 g/dL (ref 30.0–36.0)
MCV: 97.7 fL (ref 80.0–100.0)
Monocytes Absolute: 4.4 10*3/uL — ABNORMAL HIGH (ref 0.1–1.0)
Monocytes Relative: 26 %
Neutro Abs: 10.1 10*3/uL — ABNORMAL HIGH (ref 1.7–7.7)
Neutrophils Relative %: 61 %
Platelets: 186 10*3/uL (ref 150–400)
RBC: 3.02 MIL/uL — ABNORMAL LOW (ref 3.87–5.11)
RDW: 18.8 % — ABNORMAL HIGH (ref 11.5–15.5)
WBC: 16.5 10*3/uL — AB (ref 4.0–10.5)
nRBC: 0 % (ref 0.0–0.2)

## 2018-06-25 LAB — SAMPLE TO BLOOD BANK

## 2018-06-25 MED ORDER — EPOETIN ALFA-EPBX 40000 UNIT/ML IJ SOLN
40000.0000 [IU] | Freq: Once | INTRAMUSCULAR | Status: AC
  Administered 2018-06-25: 40000 [IU] via SUBCUTANEOUS
  Filled 2018-06-25: qty 1

## 2018-06-25 NOTE — Progress Notes (Signed)
Patient here today for biopsy results.  

## 2018-06-28 ENCOUNTER — Encounter (HOSPITAL_COMMUNITY): Payer: Self-pay | Admitting: Oncology

## 2018-06-28 LAB — MISC LABCORP TEST (SEND OUT): LABCORP TEST CODE: 451953

## 2018-07-18 DIAGNOSIS — M1380 Other specified arthritis, unspecified site: Secondary | ICD-10-CM | POA: Insufficient documentation

## 2018-07-18 DIAGNOSIS — R768 Other specified abnormal immunological findings in serum: Secondary | ICD-10-CM | POA: Insufficient documentation

## 2018-07-25 ENCOUNTER — Inpatient Hospital Stay: Payer: Medicare Other

## 2018-07-25 ENCOUNTER — Inpatient Hospital Stay: Payer: Medicare Other | Attending: Oncology

## 2018-07-25 VITALS — BP 167/71 | HR 66

## 2018-07-25 DIAGNOSIS — D630 Anemia in neoplastic disease: Secondary | ICD-10-CM | POA: Diagnosis present

## 2018-07-25 DIAGNOSIS — C9310 Chronic myelomonocytic leukemia not having achieved remission: Secondary | ICD-10-CM | POA: Insufficient documentation

## 2018-07-25 DIAGNOSIS — D649 Anemia, unspecified: Secondary | ICD-10-CM

## 2018-07-25 LAB — CBC
HCT: 29.2 % — ABNORMAL LOW (ref 36.0–46.0)
Hemoglobin: 9.4 g/dL — ABNORMAL LOW (ref 12.0–15.0)
MCH: 33.1 pg (ref 26.0–34.0)
MCHC: 32.2 g/dL (ref 30.0–36.0)
MCV: 102.8 fL — ABNORMAL HIGH (ref 80.0–100.0)
Platelets: 105 10*3/uL — ABNORMAL LOW (ref 150–400)
RBC: 2.84 MIL/uL — ABNORMAL LOW (ref 3.87–5.11)
RDW: 19.7 % — AB (ref 11.5–15.5)
WBC: 10.1 10*3/uL (ref 4.0–10.5)
nRBC: 0 % (ref 0.0–0.2)

## 2018-07-25 LAB — BASIC METABOLIC PANEL
Anion gap: 8 (ref 5–15)
BUN: 23 mg/dL (ref 8–23)
CO2: 27 mmol/L (ref 22–32)
CREATININE: 0.74 mg/dL (ref 0.44–1.00)
Calcium: 9 mg/dL (ref 8.9–10.3)
Chloride: 105 mmol/L (ref 98–111)
GFR calc Af Amer: >60 mL/min (ref >60)
Glucose, Bld: 89 mg/dL (ref 70–99)
Potassium: 3.6 mmol/L (ref 3.5–5.1)
Sodium: 140 mmol/L (ref 135–145)

## 2018-07-25 MED ORDER — EPOETIN ALFA-EPBX 40000 UNIT/ML IJ SOLN
40000.0000 [IU] | Freq: Once | INTRAMUSCULAR | Status: AC
  Administered 2018-07-25: 40000 [IU] via SUBCUTANEOUS
  Filled 2018-07-25: qty 1

## 2018-07-25 NOTE — Progress Notes (Signed)
OK PER DR. FINNEGAN TO GET SHOT WITH HIGH BP READING

## 2018-07-25 NOTE — Progress Notes (Signed)
Nutrition Assessment   Reason for Assessment:  Family concerned about weight loss.   ASSESSMENT:   83 year old female with CMML currently not being treated.  Will receive retacrit injection and blood transfusions as needed.  Past medical history of anemia, afib, GERD, HTN, HLD  Met with patient and 2 daughters Kalman Shan and ??).  Patient reports that she lives alone in [place] and prepares all of her meals.  Breakfast is sometimes cereal or eggs and bacon/sausage, toast.  Lunch is meat and vegetables (stewed beef in crockpot yesterday with green beans and corn, squash and cookies with water.  Supper last night was fruit, and pimento cheese and crackers.  Patient does not eat much meat especially during the evening due to hiatal hernia.  Chops meat finely and chews well before swallowing.  Sometimes eats ice cream.  Likes the "juice" type shakes (ensure clear) better than "milky" shakes.     Nutrition Focused Physical Exam: deferred today   Medications:  Vit b 12, fe sulfate, MVI,remeron and predisone current but tapering off   Labs: Hgb 9.2  Anthropometrics:   Height: 4 ft 11 inches Weight: 90 lb 7 oz today UBW: 100 lb or less over the last few years.   Noted per chart 96 lb 10/15/17 12/31 95 lb BMI: 18  5% weight loss in the last 2 months   Estimated Energy Needs  Kcals: 1200 calories Protein: 60 g Fluid: 1.2 L   NUTRITION DIAGNOSIS: Unintentional weight loss related to age, CMML, hiatal hernia, as evidenced by 5% weight loss in the last 2 months, avoiding meats    INTERVENTION:  Discussed soft, moist protein foods and list given to patient.  Encouraged protein rich food at every meal/snack. Discussed additional "juicy" type supplements with patient and daughter and samples given today. Encouraged 2 per day Discussed strategies to increase calories and protein. Handout provided   MONITORING, EVALUATION, GOAL: Patient will consume adequate calories and protein to prevent  further weight loss   Next Visit: April 23 following MD appt  Alvine Mostafa B. Zenia Resides, [place], [place] Registered Dietitian [phone] (pager)

## 2018-07-26 LAB — KAPPA/LAMBDA LIGHT CHAINS
Kappa free light chain: 20.9 mg/L — ABNORMAL HIGH (ref 3.3–19.4)
Kappa, lambda light chain ratio: 1.3 (ref 0.26–1.65)
Lambda free light chains: 16.1 mg/L (ref 5.7–26.3)

## 2018-07-26 LAB — PROTEIN ELECTROPHORESIS, SERUM
A/G Ratio: 1.1 (ref 0.7–1.7)
Albumin ELP: 3.7 g/dL (ref 2.9–4.4)
Alpha-1-Globulin: 0.2 g/dL (ref 0.0–0.4)
Alpha-2-Globulin: 0.9 g/dL (ref 0.4–1.0)
Beta Globulin: 0.7 g/dL (ref 0.7–1.3)
Gamma Globulin: 1.5 g/dL (ref 0.4–1.8)
Globulin, Total: 3.4 g/dL (ref 2.2–3.9)
M-Spike, %: 1.1 g/dL — ABNORMAL HIGH
TOTAL PROTEIN ELP: 7.1 g/dL (ref 6.0–8.5)

## 2018-07-26 LAB — IGG, IGA, IGM
IGG (IMMUNOGLOBIN G), SERUM: 1714 mg/dL — AB (ref 700–1600)
IgA: 76 mg/dL (ref 64–422)
IgM (Immunoglobulin M), Srm: 164 mg/dL (ref 26–217)

## 2018-07-26 LAB — BETA 2 MICROGLOBULIN, SERUM: Beta-2 Microglobulin: 2.6 mg/L — ABNORMAL HIGH (ref 0.6–2.4)

## 2018-08-19 ENCOUNTER — Ambulatory Visit: Payer: Medicare Other | Admitting: Oncology

## 2018-08-19 ENCOUNTER — Other Ambulatory Visit: Payer: Medicare Other

## 2018-08-22 ENCOUNTER — Other Ambulatory Visit: Payer: Self-pay

## 2018-08-22 ENCOUNTER — Inpatient Hospital Stay: Payer: Medicare Other | Attending: Oncology

## 2018-08-22 ENCOUNTER — Inpatient Hospital Stay: Payer: Medicare Other

## 2018-08-22 VITALS — BP 146/61 | HR 66

## 2018-08-22 DIAGNOSIS — D630 Anemia in neoplastic disease: Secondary | ICD-10-CM | POA: Insufficient documentation

## 2018-08-22 DIAGNOSIS — C9310 Chronic myelomonocytic leukemia not having achieved remission: Secondary | ICD-10-CM | POA: Diagnosis not present

## 2018-08-22 DIAGNOSIS — D649 Anemia, unspecified: Secondary | ICD-10-CM

## 2018-08-22 LAB — CBC WITH DIFFERENTIAL/PLATELET
Abs Immature Granulocytes: 0.03 10*3/uL (ref 0.00–0.07)
BASOS ABS: 0 10*3/uL (ref 0.0–0.1)
Basophils Relative: 0 %
Eosinophils Absolute: 0 10*3/uL (ref 0.0–0.5)
Eosinophils Relative: 0 %
HCT: 29.2 % — ABNORMAL LOW (ref 36.0–46.0)
Hemoglobin: 9.4 g/dL — ABNORMAL LOW (ref 12.0–15.0)
Immature Granulocytes: 0 %
Lymphocytes Relative: 16 %
Lymphs Abs: 1.4 10*3/uL (ref 0.7–4.0)
MCH: 34.1 pg — ABNORMAL HIGH (ref 26.0–34.0)
MCHC: 32.2 g/dL (ref 30.0–36.0)
MCV: 105.8 fL — ABNORMAL HIGH (ref 80.0–100.0)
Monocytes Absolute: 2.2 10*3/uL — ABNORMAL HIGH (ref 0.1–1.0)
Monocytes Relative: 24 %
Neutro Abs: 5.2 10*3/uL (ref 1.7–7.7)
Neutrophils Relative %: 60 %
Platelets: 145 10*3/uL — ABNORMAL LOW (ref 150–400)
RBC: 2.76 MIL/uL — AB (ref 3.87–5.11)
RDW: 17.9 % — AB (ref 11.5–15.5)
WBC: 8.9 10*3/uL (ref 4.0–10.5)
nRBC: 0 % (ref 0.0–0.2)

## 2018-08-22 LAB — BASIC METABOLIC PANEL
Anion gap: 8 (ref 5–15)
BUN: 25 mg/dL — ABNORMAL HIGH (ref 8–23)
CHLORIDE: 105 mmol/L (ref 98–111)
CO2: 25 mmol/L (ref 22–32)
Calcium: 9 mg/dL (ref 8.9–10.3)
Creatinine, Ser: 0.63 mg/dL (ref 0.44–1.00)
GFR calc Af Amer: >60 mL/min (ref >60)
GFR calc non Af Amer: >60 mL/min (ref >60)
Glucose, Bld: 112 mg/dL — ABNORMAL HIGH (ref 70–99)
Potassium: 3.8 mmol/L (ref 3.5–5.1)
Sodium: 138 mmol/L (ref 135–145)

## 2018-08-22 MED ORDER — EPOETIN ALFA-EPBX 40000 UNIT/ML IJ SOLN
40000.0000 [IU] | Freq: Once | INTRAMUSCULAR | Status: AC
  Administered 2018-08-22: 40000 [IU] via SUBCUTANEOUS
  Filled 2018-08-22: qty 1

## 2018-08-23 LAB — IGG, IGA, IGM
IgA: 73 mg/dL (ref 64–422)
IgG (Immunoglobin G), Serum: 1756 mg/dL — ABNORMAL HIGH (ref 700–1600)
IgM (Immunoglobulin M), Srm: 144 mg/dL (ref 26–217)

## 2018-08-23 LAB — PROTEIN ELECTROPHORESIS, SERUM
A/G Ratio: 1.2 (ref 0.7–1.7)
Albumin ELP: 3.8 g/dL (ref 2.9–4.4)
Alpha-1-Globulin: 0.2 g/dL (ref 0.0–0.4)
Alpha-2-Globulin: 0.9 g/dL (ref 0.4–1.0)
Beta Globulin: 0.7 g/dL (ref 0.7–1.3)
Gamma Globulin: 1.5 g/dL (ref 0.4–1.8)
Globulin, Total: 3.2 g/dL (ref 2.2–3.9)
M-Spike, %: 1.2 g/dL — ABNORMAL HIGH
Total Protein ELP: 7 g/dL (ref 6.0–8.5)

## 2018-08-23 LAB — KAPPA/LAMBDA LIGHT CHAINS
Kappa free light chain: 19.5 mg/L — ABNORMAL HIGH (ref 3.3–19.4)
Kappa, lambda light chain ratio: 1.36 (ref 0.26–1.65)
Lambda free light chains: 14.3 mg/L (ref 5.7–26.3)

## 2018-08-29 ENCOUNTER — Other Ambulatory Visit: Payer: Self-pay | Admitting: Internal Medicine

## 2018-08-29 DIAGNOSIS — R222 Localized swelling, mass and lump, trunk: Secondary | ICD-10-CM

## 2018-09-02 ENCOUNTER — Other Ambulatory Visit: Payer: Self-pay

## 2018-09-02 ENCOUNTER — Ambulatory Visit
Admission: RE | Admit: 2018-09-02 | Discharge: 2018-09-02 | Disposition: A | Discharge status: Home / Self Care | Payer: Medicare Other | Source: Ambulatory Visit | Attending: Internal Medicine | Admitting: Internal Medicine

## 2018-09-02 DIAGNOSIS — R222 Localized swelling, mass and lump, trunk: Secondary | ICD-10-CM | POA: Insufficient documentation

## 2018-09-02 DIAGNOSIS — K449 Diaphragmatic hernia without obstruction or gangrene: Secondary | ICD-10-CM | POA: Insufficient documentation

## 2018-09-02 DIAGNOSIS — N839 Noninflammatory disorder of ovary, fallopian tube and broad ligament, unspecified: Secondary | ICD-10-CM | POA: Diagnosis not present

## 2018-09-02 DIAGNOSIS — I2510 Atherosclerotic heart disease of native coronary artery without angina pectoris: Secondary | ICD-10-CM | POA: Insufficient documentation

## 2018-09-02 DIAGNOSIS — K5730 Diverticulosis of large intestine without perforation or abscess without bleeding: Secondary | ICD-10-CM | POA: Diagnosis not present

## 2018-09-26 ENCOUNTER — Ambulatory Visit: Payer: Medicare Other

## 2018-09-26 ENCOUNTER — Ambulatory Visit: Payer: Medicare Other | Admitting: Oncology

## 2018-09-26 ENCOUNTER — Other Ambulatory Visit: Payer: Medicare Other

## 2018-10-23 DIAGNOSIS — Z79899 Other long term (current) drug therapy: Secondary | ICD-10-CM | POA: Insufficient documentation

## 2018-12-27 ENCOUNTER — Other Ambulatory Visit: Payer: Self-pay

## 2018-12-29 NOTE — Progress Notes (Signed)
[place]  [phone] [phone]) [phone]  ID: Vickie Howard OB: 04-Dec-1935  MR#: [id number]  HLK#:[id number]  Patient Care Team: Adin Hector, MD as PCP - General (Internal Medicine)  CHIEF COMPLAINT: CMML  INTERVAL HISTORY: Patient returns to clinic today for repeat laboratory work and further evaluation.  She continues to have chronic weakness and fatigue, but states this has improved over the past several months.  She otherwise feels well. She has no neurologic complaints. She denies any recent fevers or illnesses.  Her appetite has improved and she has gained weight in the interim.  She denies any chest pain, shortness of breath, cough, or hemoptysis. She denies any nausea, vomiting, constipation, or diarrhea.  She has no melena or hematochezia.  She has no urinary complaints.  Patient offers no further specific complaints today.  REVIEW OF SYSTEMS:   Review of Systems  Constitutional: Positive for malaise/fatigue. Negative for fever and weight loss.  Respiratory: Negative.  Negative for cough, hemoptysis and shortness of breath.   Cardiovascular: Negative.  Negative for chest pain and leg swelling.  Gastrointestinal: Negative.  Negative for abdominal pain, blood in stool and melena.  Genitourinary: Negative.  Negative for hematuria.  Musculoskeletal: Negative.  Negative for back pain and joint pain.  Skin: Negative.  Negative for rash.  Neurological: Positive for weakness. Negative for sensory change and focal weakness.  Psychiatric/Behavioral: Negative.  Negative for memory loss. The patient is not nervous/anxious.     As per HPI. Otherwise, a complete review of systems is negative.  PAST MEDICAL HISTORY: Past Medical History:  Diagnosis Date  . Anemia   . Arthritis   . Atrial fibrillation ([place])   . GERD (gastroesophageal reflux disease)   . Heart murmur   . History of hiatal hernia   . HLD (hyperlipidemia)   . HTN (hypertension)    . Occasional tremors    hands    PAST SURGICAL HISTORY: Past Surgical History:  Procedure Laterality Date  . JOINT REPLACEMENT     right total hip 2015  . Left total hip arthroplasty  10/12/2014  . PACEMAKER INSERTION N/A 08/12/2015   Procedure: INSERTION PACEMAKER attempt unable to obtain access ;  Surgeon: Isaias Cowman, MD;  Location: [facility] ORS;  Service: Cardiovascular;  Laterality: N/A;  . TOTAL HIP ARTHROPLASTY Left 10/12/2014   Procedure: TOTAL HIP ARTHROPLASTY;  Surgeon: Dereck Leep, MD;  Location: [facility] ORS;  Service: Orthopedics;  Laterality: Left;  . TUBAL LIGATION      FAMILY HISTORY: Family History  Problem Relation Age of Onset  . Aneurysm Father   . Breast cancer Daughter     ADVANCED DIRECTIVES (Y/N):  N  HEALTH MAINTENANCE: Social History   Tobacco Use  . Smoking status: Never Smoker  . Smokeless tobacco: Never Used  Substance Use Topics  . Alcohol use: No  . Drug use: No     Colonoscopy:  PAP:  Bone density:  Lipid panel:  Allergies  Allergen Reactions  . Codeine Sulfate Other (See Comments)    Reaction:  Syncope  . Levaquin [Levofloxacin] Nausea And Vomiting  . Oxycodone Other (See Comments)    Reaction:  Disoriented     Current Outpatient Medications  Medication Sig Dispense Refill  . acetaminophen (TYLENOL) 500 MG tablet Take 1,000 mg by mouth every 6 (six) hours as needed for mild pain or headache.    Marland Kitchen aspirin EC 81 MG tablet Take 81 mg by mouth every other day.     Marland Kitchen  atorvastatin (LIPITOR) 20 MG tablet Take 20 mg by mouth daily.    . Cyanocobalamin (VITAMIN B 12 PO) Take 1 mcg by mouth daily.    Marland Kitchen docusate sodium (COLACE) 100 MG capsule Take 100 mg by mouth 2 (two) times daily.    . feeding supplement, ENSURE ENLIVE, (ENSURE ENLIVE) LIQD Take 237 mLs by mouth 2 (two) times daily between meals. 237 mL 12  . ferrous sulfate 325 (65 FE) MG tablet Take 325 mg by mouth daily with breakfast.    . gabapentin (NEURONTIN) 100 MG capsule  Take 100 mg by mouth daily.     . hydroxychloroquine (PLAQUENIL) 200 MG tablet Take 1 tablet by mouth 1 day or 1 dose.    Marland Kitchen LANOXIN 62.5 MCG TABS TK 1 T PO ONCE D  11  . lisinopril (PRINIVIL,ZESTRIL) 2.5 MG tablet Take 2.5 mg by mouth daily.   1  . meloxicam (MOBIC) 7.5 MG tablet Take 7.5 mg by mouth daily.    . mirtazapine (REMERON) 15 MG tablet Take 1 tablet by mouth 1 day or 1 dose.    . Multiple Vitamin (MULTI-VITAMIN) tablet Take 1 tablet by mouth 1 day or 1 dose.    . Multiple Vitamins-Minerals (PRESERVISION AREDS 2) CAPS Take 1 capsule by mouth daily.    Marland Kitchen omeprazole (PRILOSEC) 20 MG capsule Take 20 mg by mouth daily.    . sertraline (ZOLOFT) 50 MG tablet Take 75 mg by mouth daily. Patient taking 90m daily per Dr. KJens Som     No current facility-administered medications for this visit.     OBJECTIVE: Vitals:   12/30/18 1024  BP: (!) 153/71  Pulse: 63  Temp: (!) 97.3 F (36.3 C)     Body mass index is 20 kg/m.    ECOG FS:0 - Asymptomatic  General: Thin, no acute distress.  Sitting in a wheelchair. Eyes: Pink conjunctiva, anicteric sclera. HEENT: Normocephalic, moist mucous membranes. Lungs: Clear to auscultation bilaterally. Heart: Regular rate and rhythm. No rubs, murmurs, or gallops. Abdomen: Soft, nontender, nondistended. No organomegaly noted, normoactive bowel sounds. Musculoskeletal: No edema, cyanosis, or clubbing. Neuro: Alert, answering all questions appropriately. Cranial nerves grossly intact. Skin: No rashes or petechiae noted. Psych: Normal affect.  LAB RESULTS:  Lab Results  Component Value Date   NA 140 12/30/2018   K 4.0 12/30/2018   CL 107 12/30/2018   CO2 24 12/30/2018   GLUCOSE 89 12/30/2018   BUN 24 (H) 12/30/2018   CREATININE 0.73 12/30/2018   CALCIUM 9.5 12/30/2018   PROT 6.9 10/29/2014   ALBUMIN 3.2 (L) 10/29/2014   AST 18 10/29/2014   ALT 18 10/29/2014   ALKPHOS 85 10/29/2014   BILITOT 0.2 (L) 10/29/2014   GFRNONAA >60 12/30/2018    GFRAA >60 12/30/2018    Lab Results  Component Value Date   WBC 9.1 12/30/2018   NEUTROABS 5.1 12/30/2018   HGB 8.9 (L) 12/30/2018   HCT 27.0 (L) 12/30/2018   MCV 108.0 (H) 12/30/2018   PLT 109 (L) 12/30/2018     STUDIES: No results found.  ASSESSMENT: CMML.  PLAN:   1. CMML: Confirmed on bone marrow biopsy from June 18, 2018.  Patient noted to have a hypercellular marrow with mild dysplasia in all 3 lineages.  No increased blasts.  Peripheral blood continues to show an absolute monocytosis of 2.3. Given patient's advanced age and she is relatively asymptomatic, would not pursue treatment at this time with either Vidaza or Dacogen, but could reconsider in  the future.  Will continue supportive care with Retacrit injections and blood transfusions as needed. 2.  Anemia: Secondary to CMML.  Proceed with Retacrit today.  Return to clinic in 6 weeks for laboratory work and reticulocyte rate only and then in 3 months for further evaluation and continuation of treatment. 3.  Elevated MCV: B12 and folate are within normal limits.  Secondary to CMML.   3.  Thrombocytopenia: Platelets have trended down slightly, monitor. 4.  Leukocytosis: Resolved. 5.  Weight loss: Resolved.  Patient is now gaining weight. 6.  Joint pain: Patient does not complain of this today.  Patient expressed understanding and was in agreement with this plan. She also understands that She can call clinic at any time with any questions, concerns, or complaints.    Lloyd Huger, MD   12/31/2018 4:30 PM

## 2018-12-30 ENCOUNTER — Encounter: Payer: Self-pay | Admitting: Oncology

## 2018-12-30 ENCOUNTER — Inpatient Hospital Stay (HOSPITAL_BASED_OUTPATIENT_CLINIC_OR_DEPARTMENT_OTHER): Payer: Medicare Other | Admitting: Oncology

## 2018-12-30 ENCOUNTER — Inpatient Hospital Stay: Payer: Medicare Other | Attending: Oncology

## 2018-12-30 ENCOUNTER — Inpatient Hospital Stay: Payer: Medicare Other

## 2018-12-30 ENCOUNTER — Other Ambulatory Visit: Payer: Self-pay

## 2018-12-30 VITALS — BP 153/71 | HR 63 | Temp 97.3°F | Wt 99.0 lb

## 2018-12-30 DIAGNOSIS — C9310 Chronic myelomonocytic leukemia not having achieved remission: Secondary | ICD-10-CM | POA: Diagnosis not present

## 2018-12-30 DIAGNOSIS — D649 Anemia, unspecified: Secondary | ICD-10-CM

## 2018-12-30 DIAGNOSIS — D630 Anemia in neoplastic disease: Secondary | ICD-10-CM

## 2018-12-30 DIAGNOSIS — D696 Thrombocytopenia, unspecified: Secondary | ICD-10-CM | POA: Diagnosis not present

## 2018-12-30 DIAGNOSIS — I10 Essential (primary) hypertension: Secondary | ICD-10-CM | POA: Insufficient documentation

## 2018-12-30 DIAGNOSIS — R5383 Other fatigue: Secondary | ICD-10-CM

## 2018-12-30 DIAGNOSIS — M1990 Unspecified osteoarthritis, unspecified site: Secondary | ICD-10-CM | POA: Insufficient documentation

## 2018-12-30 DIAGNOSIS — R531 Weakness: Secondary | ICD-10-CM | POA: Diagnosis not present

## 2018-12-30 DIAGNOSIS — M810 Age-related osteoporosis without current pathological fracture: Secondary | ICD-10-CM | POA: Insufficient documentation

## 2018-12-30 DIAGNOSIS — I4891 Unspecified atrial fibrillation: Secondary | ICD-10-CM

## 2018-12-30 LAB — CBC WITH DIFFERENTIAL/PLATELET
Abs Immature Granulocytes: 0.03 10*3/uL (ref 0.00–0.07)
Basophils Absolute: 0 10*3/uL (ref 0.0–0.1)
Basophils Relative: 0 %
Eosinophils Absolute: 0 10*3/uL (ref 0.0–0.5)
Eosinophils Relative: 0 %
HCT: 27 % — ABNORMAL LOW (ref 36.0–46.0)
Hemoglobin: 8.9 g/dL — ABNORMAL LOW (ref 12.0–15.0)
Immature Granulocytes: 0 %
Lymphocytes Relative: 18 %
Lymphs Abs: 1.6 10*3/uL (ref 0.7–4.0)
MCH: 35.6 pg — ABNORMAL HIGH (ref 26.0–34.0)
MCHC: 33 g/dL (ref 30.0–36.0)
MCV: 108 fL — ABNORMAL HIGH (ref 80.0–100.0)
Monocytes Absolute: 2.3 10*3/uL — ABNORMAL HIGH (ref 0.1–1.0)
Monocytes Relative: 26 %
Neutro Abs: 5.1 10*3/uL (ref 1.7–7.7)
Neutrophils Relative %: 56 %
Platelets: 109 10*3/uL — ABNORMAL LOW (ref 150–400)
RBC: 2.5 MIL/uL — ABNORMAL LOW (ref 3.87–5.11)
RDW: 15.5 % (ref 11.5–15.5)
WBC: 9.1 10*3/uL (ref 4.0–10.5)
nRBC: 0 % (ref 0.0–0.2)

## 2018-12-30 LAB — BASIC METABOLIC PANEL
Anion gap: 9 (ref 5–15)
BUN: 24 mg/dL — ABNORMAL HIGH (ref 8–23)
CO2: 24 mmol/L (ref 22–32)
Calcium: 9.5 mg/dL (ref 8.9–10.3)
Chloride: 107 mmol/L (ref 98–111)
Creatinine, Ser: 0.73 mg/dL (ref 0.44–1.00)
GFR calc Af Amer: >60 mL/min (ref >60)
GFR calc non Af Amer: >60 mL/min (ref >60)
Glucose, Bld: 89 mg/dL (ref 70–99)
Potassium: 4 mmol/L (ref 3.5–5.1)
Sodium: 140 mmol/L (ref 135–145)

## 2018-12-30 MED ORDER — EPOETIN ALFA-EPBX 40000 UNIT/ML IJ SOLN
40000.0000 [IU] | Freq: Once | INTRAMUSCULAR | Status: AC
  Administered 2018-12-30: 40000 [IU] via SUBCUTANEOUS
  Filled 2018-12-30: qty 1

## 2018-12-30 NOTE — Progress Notes (Signed)
Patient stated that she had been doing well with no complaints. 

## 2018-12-30 NOTE — Progress Notes (Signed)
Nutrition Follow-up:  Patient with CMML currently not receiving treatment. Followed by Dr. Grayland Ormond.  Met with patient in clinic prior to MD visit for nutrition follow-up.  Patient alone in exam room. Reports that appetite is slightly better.  "I have been making myself eat more."  Patient reports that she continues to live alone in condo and wants to continue to live alone.  Reports that she has been drinking the ensure clear about 1- 1 1/2 per day.  Typically eats 3 meals per day (heavier lunch) than evening meal.  Reports that seh has been eating more ice cream lately.    Medications: remeron  Labs: reviewed  Anthropometrics:   Weight today was 99 lb increased from 90 lb 7 oz on 07/25/2018.  Patient reports that she was weighed in the wheelchair.     NUTRITION DIAGNOSIS: Unintentional weight loss stable   INTERVENTION:  Provided samples of boost soothe (higher calorie) juice drink for patient to try.   Encouraged patient to continue to focus on eating high calorie, high protein foods so that she can maintain strength and independence.  Patient verbalized understanding.  Contact information provided    MONITORING, EVALUATION, GOAL: Patient will consume adequate calories and protein to prevent further weight loss   NEXT VISIT: no follow-up.  Patient to contact RD if needed in the future  Kasara Schomer B. Zenia Resides, [place], [place] Registered Dietitian [phone] (pager)

## 2018-12-31 LAB — PROTEIN ELECTROPHORESIS, SERUM
A/G Ratio: 1.2 (ref 0.7–1.7)
Albumin ELP: 3.8 g/dL (ref 2.9–4.4)
Alpha-1-Globulin: 0.2 g/dL (ref 0.0–0.4)
Alpha-2-Globulin: 0.8 g/dL (ref 0.4–1.0)
Beta Globulin: 0.8 g/dL (ref 0.7–1.3)
Gamma Globulin: 1.4 g/dL (ref 0.4–1.8)
Globulin, Total: 3.3 g/dL (ref 2.2–3.9)
M-Spike, %: 1.1 g/dL — ABNORMAL HIGH
Total Protein ELP: 7.1 g/dL (ref 6.0–8.5)

## 2018-12-31 LAB — KAPPA/LAMBDA LIGHT CHAINS
Kappa free light chain: 24 mg/L — ABNORMAL HIGH (ref 3.3–19.4)
Kappa, lambda light chain ratio: 1.55 (ref 0.26–1.65)
Lambda free light chains: 15.5 mg/L (ref 5.7–26.3)

## 2018-12-31 LAB — IGG, IGA, IGM
IgA: 64 mg/dL (ref 64–422)
IgG (Immunoglobin G), Serum: 1549 mg/dL (ref 586–1602)
IgM (Immunoglobulin M), Srm: 124 mg/dL (ref 26–217)

## 2019-02-13 ENCOUNTER — Inpatient Hospital Stay: Payer: Medicare Other

## 2019-02-13 ENCOUNTER — Other Ambulatory Visit: Payer: Self-pay

## 2019-02-13 ENCOUNTER — Inpatient Hospital Stay: Payer: Medicare Other | Attending: Oncology

## 2019-02-13 VITALS — BP 160/76 | HR 67

## 2019-02-13 DIAGNOSIS — C9310 Chronic myelomonocytic leukemia not having achieved remission: Secondary | ICD-10-CM | POA: Diagnosis not present

## 2019-02-13 DIAGNOSIS — D630 Anemia in neoplastic disease: Secondary | ICD-10-CM | POA: Insufficient documentation

## 2019-02-13 DIAGNOSIS — D649 Anemia, unspecified: Secondary | ICD-10-CM

## 2019-02-13 LAB — CBC WITH DIFFERENTIAL/PLATELET
Abs Immature Granulocytes: 0.05 10*3/uL (ref 0.00–0.07)
Basophils Absolute: 0 10*3/uL (ref 0.0–0.1)
Basophils Relative: 0 %
Eosinophils Absolute: 0 10*3/uL (ref 0.0–0.5)
Eosinophils Relative: 0 %
HCT: 28.5 % — ABNORMAL LOW (ref 36.0–46.0)
Hemoglobin: 9.3 g/dL — ABNORMAL LOW (ref 12.0–15.0)
Immature Granulocytes: 1 %
Lymphocytes Relative: 14 %
Lymphs Abs: 1.5 10*3/uL (ref 0.7–4.0)
MCH: 35.2 pg — ABNORMAL HIGH (ref 26.0–34.0)
MCHC: 32.6 g/dL (ref 30.0–36.0)
MCV: 108 fL — ABNORMAL HIGH (ref 80.0–100.0)
Monocytes Absolute: 2.6 10*3/uL — ABNORMAL HIGH (ref 0.1–1.0)
Monocytes Relative: 24 %
Neutro Abs: 6.5 10*3/uL (ref 1.7–7.7)
Neutrophils Relative %: 61 %
Platelets: 135 10*3/uL — ABNORMAL LOW (ref 150–400)
RBC: 2.64 MIL/uL — ABNORMAL LOW (ref 3.87–5.11)
RDW: 14.6 % (ref 11.5–15.5)
WBC: 10.6 10*3/uL — ABNORMAL HIGH (ref 4.0–10.5)
nRBC: 0 % (ref 0.0–0.2)

## 2019-02-13 LAB — BASIC METABOLIC PANEL
Anion gap: 11 (ref 5–15)
BUN: 21 mg/dL (ref 8–23)
CO2: 24 mmol/L (ref 22–32)
Calcium: 9.2 mg/dL (ref 8.9–10.3)
Chloride: 104 mmol/L (ref 98–111)
Creatinine, Ser: 0.76 mg/dL (ref 0.44–1.00)
GFR calc Af Amer: >60 mL/min (ref >60)
GFR calc non Af Amer: >60 mL/min (ref >60)
Glucose, Bld: 95 mg/dL (ref 70–99)
Potassium: 4 mmol/L (ref 3.5–5.1)
Sodium: 139 mmol/L (ref 135–145)

## 2019-02-13 MED ORDER — EPOETIN ALFA-EPBX 40000 UNIT/ML IJ SOLN
40000.0000 [IU] | Freq: Once | INTRAMUSCULAR | Status: AC
  Administered 2019-02-13: 40000 [IU] via SUBCUTANEOUS
  Filled 2019-02-13: qty 1

## 2019-02-14 LAB — IGG, IGA, IGM
IgA: 67 mg/dL (ref 64–422)
IgG (Immunoglobin G), Serum: 1668 mg/dL — ABNORMAL HIGH (ref 586–1602)
IgM (Immunoglobulin M), Srm: 125 mg/dL (ref 26–217)

## 2019-02-14 LAB — KAPPA/LAMBDA LIGHT CHAINS
Kappa free light chain: 24.9 mg/L — ABNORMAL HIGH (ref 3.3–19.4)
Kappa, lambda light chain ratio: 1.42 (ref 0.26–1.65)
Lambda free light chains: 17.5 mg/L (ref 5.7–26.3)

## 2019-02-18 LAB — PROTEIN ELECTROPHORESIS, SERUM
A/G Ratio: 1.2 (ref 0.7–1.7)
Albumin ELP: 3.9 g/dL (ref 2.9–4.4)
Alpha-1-Globulin: 0.2 g/dL (ref 0.0–0.4)
Alpha-2-Globulin: 0.8 g/dL (ref 0.4–1.0)
Beta Globulin: 0.8 g/dL (ref 0.7–1.3)
Gamma Globulin: 1.4 g/dL (ref 0.4–1.8)
Globulin, Total: 3.2 g/dL (ref 2.2–3.9)
M-Spike, %: 1.1 g/dL — ABNORMAL HIGH
Total Protein ELP: 7.1 g/dL (ref 6.0–8.5)

## 2019-03-30 NOTE — Progress Notes (Signed)
[place]  Telephone:(336) [phone] Fax:(336) [phone]  ID: Vickie Howard OB: 12-25-1935  MR#: [id number]  ZJQ#:[id number]  Patient Care Team: Adin Hector, MD as PCP - General (Internal Medicine)  CHIEF COMPLAINT: CMML  INTERVAL HISTORY: Patient returns to clinic today for repeat laboratory work, further evaluation, and continuation of Retacrit.  She does not complain of any further weakness or fatigue.  She currently feels well and is asymptomatic. She has no neurologic complaints. She denies any recent fevers or illnesses.  She has a good appetite and continues to gain weight.  She denies any chest pain, shortness of breath, cough, or hemoptysis. She denies any nausea, vomiting, constipation, or diarrhea.  She has no melena or hematochezia.  She has no urinary complaints.  Patient offers no specific complaints today.  REVIEW OF SYSTEMS:   Review of Systems  Constitutional: Negative.  Negative for fever, malaise/fatigue and weight loss.  Respiratory: Negative.  Negative for cough, hemoptysis and shortness of breath.   Cardiovascular: Negative.  Negative for chest pain and leg swelling.  Gastrointestinal: Negative.  Negative for abdominal pain, blood in stool and melena.  Genitourinary: Negative.  Negative for hematuria.  Musculoskeletal: Negative.  Negative for back pain and joint pain.  Skin: Negative.  Negative for rash.  Neurological: Negative.  Negative for dizziness, sensory change, focal weakness, weakness and headaches.  Psychiatric/Behavioral: Negative.  Negative for memory loss. The patient is not nervous/anxious.     As per HPI. Otherwise, a complete review of systems is negative.  PAST MEDICAL HISTORY: Past Medical History:  Diagnosis Date  . Anemia   . Arthritis   . Atrial fibrillation ([place])   . GERD (gastroesophageal reflux disease)   . Heart murmur   . History of hiatal hernia   . HLD (hyperlipidemia)   . HTN (hypertension)   .  Occasional tremors    hands    PAST SURGICAL HISTORY: Past Surgical History:  Procedure Laterality Date  . JOINT REPLACEMENT     right total hip 2015  . Left total hip arthroplasty  10/12/2014  . PACEMAKER INSERTION N/A 08/12/2015   Procedure: INSERTION PACEMAKER attempt unable to obtain access ;  Surgeon: Isaias Cowman, MD;  Location: [facility] ORS;  Service: Cardiovascular;  Laterality: N/A;  . TOTAL HIP ARTHROPLASTY Left 10/12/2014   Procedure: TOTAL HIP ARTHROPLASTY;  Surgeon: Dereck Leep, MD;  Location: [facility] ORS;  Service: Orthopedics;  Laterality: Left;  . TUBAL LIGATION      FAMILY HISTORY: Family History  Problem Relation Age of Onset  . Aneurysm Father   . Breast cancer Daughter     ADVANCED DIRECTIVES (Y/N):  N  HEALTH MAINTENANCE: Social History   Tobacco Use  . Smoking status: Never Smoker  . Smokeless tobacco: Never Used  Substance Use Topics  . Alcohol use: No  . Drug use: No     Colonoscopy:  PAP:  Bone density:  Lipid panel:  Allergies  Allergen Reactions  . Codeine Sulfate Other (See Comments)    Reaction:  Syncope  . Levaquin [Levofloxacin] Nausea And Vomiting  . Oxycodone Other (See Comments)    Reaction:  Disoriented     Current Outpatient Medications  Medication Sig Dispense Refill  . acetaminophen (TYLENOL) 500 MG tablet Take 1,000 mg by mouth every 6 (six) hours as needed for mild pain or headache.    Marland Kitchen aspirin EC 81 MG tablet Take 81 mg by mouth every other day.     Marland Kitchen  atorvastatin (LIPITOR) 20 MG tablet Take 20 mg by mouth daily.    . Cyanocobalamin (VITAMIN B 12 PO) Take 1 mcg by mouth daily.    Marland Kitchen docusate sodium (COLACE) 100 MG capsule Take 100 mg by mouth 2 (two) times daily.    . feeding supplement, ENSURE ENLIVE, (ENSURE ENLIVE) LIQD Take 237 mLs by mouth 2 (two) times daily between meals. 237 mL 12  . ferrous sulfate 325 (65 FE) MG tablet Take 325 mg by mouth daily with breakfast.    . gabapentin (NEURONTIN) 100 MG capsule Take  100 mg by mouth daily.     . hydroxychloroquine (PLAQUENIL) 200 MG tablet Take 1 tablet by mouth 1 day or 1 dose.    Marland Kitchen LANOXIN 62.5 MCG TABS TK 1 T PO ONCE D  11  . lisinopril (PRINIVIL,ZESTRIL) 2.5 MG tablet Take 2.5 mg by mouth daily.   1  . meloxicam (MOBIC) 7.5 MG tablet Take 7.5 mg by mouth daily.    . mirtazapine (REMERON) 15 MG tablet Take 1 tablet by mouth 1 day or 1 dose.    . Multiple Vitamin (MULTI-VITAMIN) tablet Take 1 tablet by mouth 1 day or 1 dose.    . Multiple Vitamins-Minerals (PRESERVISION AREDS 2) CAPS Take 1 capsule by mouth daily.    Marland Kitchen omeprazole (PRILOSEC) 20 MG capsule Take 20 mg by mouth daily.    . sertraline (ZOLOFT) 50 MG tablet Take 75 mg by mouth daily. Patient taking '75mg'$  daily per Dr. Jens Som      No current facility-administered medications for this visit.     OBJECTIVE: Vitals:   04/01/19 1033  BP: (!) 162/64  Pulse: 75  Resp: 18  Temp: 97.7 F (36.5 C)     Body mass index is 20.16 kg/m.    ECOG FS:0 - Asymptomatic  General: Thin, no acute distress. Eyes: Pink conjunctiva, anicteric sclera. HEENT: Normocephalic, moist mucous membranes. Lungs: Clear to auscultation bilaterally. Heart: Regular rate and rhythm. No rubs, murmurs, or gallops. Abdomen: Soft, nontender, nondistended. No organomegaly noted, normoactive bowel sounds. Musculoskeletal: No edema, cyanosis, or clubbing. Neuro: Alert, answering all questions appropriately. Cranial nerves grossly intact. Skin: No rashes or petechiae noted. Psych: Normal affect.  LAB RESULTS:  Lab Results  Component Value Date   NA 140 04/01/2019   K 3.8 04/01/2019   CL 107 04/01/2019   CO2 27 04/01/2019   GLUCOSE 110 (H) 04/01/2019   BUN 28 (H) 04/01/2019   CREATININE 0.74 04/01/2019   CALCIUM 9.5 04/01/2019   PROT 6.9 10/29/2014   ALBUMIN 3.2 (L) 10/29/2014   AST 18 10/29/2014   ALT 18 10/29/2014   ALKPHOS 85 10/29/2014   BILITOT 0.2 (L) 10/29/2014   GFRNONAA >60 04/01/2019   GFRAA >60  04/01/2019    Lab Results  Component Value Date   WBC 9.1 04/01/2019   NEUTROABS 5.3 04/01/2019   HGB 9.5 (L) 04/01/2019   HCT 29.3 (L) 04/01/2019   MCV 108.1 (H) 04/01/2019   PLT 119 (L) 04/01/2019     STUDIES: No results found.  ASSESSMENT: CMML.  PLAN:   1. CMML: Confirmed on bone marrow biopsy from June 18, 2018.  Patient noted to have a hypercellular marrow with mild dysplasia in all 3 lineages.  No increased blasts.  Peripheral blood continues to show an absolute monocytosis of 2.3. Given patient's advanced age and she is asymptomatic, she does not require treatment at this time.  Can consider Vidaza or Dacogen in the future if absolutely  necessary.  Continue supportive care with Retacrit every 6 weeks.   2.  Anemia: Possibly secondary to CMML.  Proceed with Retacrit today.  Return to clinic in 6 weeks for laboratory work and Retacrit if her hemoglobin remains below 10.0.  She would then return to clinic in 3 months for further evaluation and continuation of treatment. 3.  Elevated MCV: B12 and folate are within normal limits.  Secondary to CMML.   3.  Thrombocytopenia: Mild, monitor. 4.  Leukocytosis: Resolved. 5.  Weight loss: Resolved.  Patient is now gaining weight. 6.  Joint pain: Patient does not complain of this today.  I spent a total of 30 minutes face-to-face with the patient of which greater than 50% of the visit was spent in counseling and coordination of care as detailed above.   Patient expressed understanding and was in agreement with this plan. She also understands that She can call clinic at any time with any questions, concerns, or complaints.    Lloyd Huger, MD   04/01/2019 4:08 PM

## 2019-03-31 ENCOUNTER — Other Ambulatory Visit: Payer: Self-pay

## 2019-03-31 ENCOUNTER — Encounter: Payer: Self-pay | Admitting: Oncology

## 2019-03-31 NOTE — Progress Notes (Signed)
Patient stated that she had been doing well for the past month.

## 2019-04-01 ENCOUNTER — Other Ambulatory Visit: Payer: Self-pay

## 2019-04-01 ENCOUNTER — Inpatient Hospital Stay: Payer: Medicare Other

## 2019-04-01 ENCOUNTER — Inpatient Hospital Stay: Payer: Medicare Other | Attending: Oncology

## 2019-04-01 ENCOUNTER — Inpatient Hospital Stay (HOSPITAL_BASED_OUTPATIENT_CLINIC_OR_DEPARTMENT_OTHER): Payer: Medicare Other | Admitting: Oncology

## 2019-04-01 VITALS — BP 162/64 | HR 75 | Temp 97.7°F | Resp 18 | Wt 99.8 lb

## 2019-04-01 DIAGNOSIS — Z23 Encounter for immunization: Secondary | ICD-10-CM | POA: Insufficient documentation

## 2019-04-01 DIAGNOSIS — D649 Anemia, unspecified: Secondary | ICD-10-CM

## 2019-04-01 DIAGNOSIS — C9310 Chronic myelomonocytic leukemia not having achieved remission: Secondary | ICD-10-CM | POA: Insufficient documentation

## 2019-04-01 DIAGNOSIS — D630 Anemia in neoplastic disease: Secondary | ICD-10-CM | POA: Insufficient documentation

## 2019-04-01 LAB — BASIC METABOLIC PANEL
Anion gap: 6 (ref 5–15)
BUN: 28 mg/dL — ABNORMAL HIGH (ref 8–23)
CO2: 27 mmol/L (ref 22–32)
Calcium: 9.5 mg/dL (ref 8.9–10.3)
Chloride: 107 mmol/L (ref 98–111)
Creatinine, Ser: 0.74 mg/dL (ref 0.44–1.00)
GFR calc Af Amer: >60 mL/min (ref >60)
GFR calc non Af Amer: >60 mL/min (ref >60)
Glucose, Bld: 110 mg/dL — ABNORMAL HIGH (ref 70–99)
Potassium: 3.8 mmol/L (ref 3.5–5.1)
Sodium: 140 mmol/L (ref 135–145)

## 2019-04-01 LAB — CBC WITH DIFFERENTIAL/PLATELET
Abs Immature Granulocytes: 0.03 10*3/uL (ref 0.00–0.07)
Basophils Absolute: 0 10*3/uL (ref 0.0–0.1)
Basophils Relative: 0 %
Eosinophils Absolute: 0 10*3/uL (ref 0.0–0.5)
Eosinophils Relative: 0 %
HCT: 29.3 % — ABNORMAL LOW (ref 36.0–46.0)
Hemoglobin: 9.5 g/dL — ABNORMAL LOW (ref 12.0–15.0)
Immature Granulocytes: 0 %
Lymphocytes Relative: 14 %
Lymphs Abs: 1.3 10*3/uL (ref 0.7–4.0)
MCH: 35.1 pg — ABNORMAL HIGH (ref 26.0–34.0)
MCHC: 32.4 g/dL (ref 30.0–36.0)
MCV: 108.1 fL — ABNORMAL HIGH (ref 80.0–100.0)
Monocytes Absolute: 2.4 10*3/uL — ABNORMAL HIGH (ref 0.1–1.0)
Monocytes Relative: 27 %
Neutro Abs: 5.3 10*3/uL (ref 1.7–7.7)
Neutrophils Relative %: 59 %
Platelets: 119 10*3/uL — ABNORMAL LOW (ref 150–400)
RBC: 2.71 MIL/uL — ABNORMAL LOW (ref 3.87–5.11)
RDW: 14.4 % (ref 11.5–15.5)
WBC: 9.1 10*3/uL (ref 4.0–10.5)
nRBC: 0 % (ref 0.0–0.2)

## 2019-04-01 MED ORDER — INFLUENZA VAC A&B SA ADJ QUAD 0.5 ML IM PRSY
0.5000 mL | PREFILLED_SYRINGE | Freq: Once | INTRAMUSCULAR | Status: AC
  Administered 2019-04-01: 0.5 mL via INTRAMUSCULAR
  Filled 2019-04-01: qty 0.5

## 2019-04-01 MED ORDER — EPOETIN ALFA-EPBX 40000 UNIT/ML IJ SOLN
40000.0000 [IU] | Freq: Once | INTRAMUSCULAR | Status: AC
  Administered 2019-04-01: 12:00:00 40000 [IU] via SUBCUTANEOUS
  Filled 2019-04-01: qty 1

## 2019-04-01 NOTE — Progress Notes (Signed)
Pt and daughter in clinic for follow up. Pt reports CMA called yesterday for pre assessment questions.  Pt denies any concerns or changes since yesterday.  States " I feel better than I have in a long time".

## 2019-04-02 LAB — PROTEIN ELECTROPHORESIS, SERUM
A/G Ratio: 1.2 (ref 0.7–1.7)
Albumin ELP: 3.8 g/dL (ref 2.9–4.4)
Alpha-1-Globulin: 0.2 g/dL (ref 0.0–0.4)
Alpha-2-Globulin: 0.8 g/dL (ref 0.4–1.0)
Beta Globulin: 0.7 g/dL (ref 0.7–1.3)
Gamma Globulin: 1.4 g/dL (ref 0.4–1.8)
Globulin, Total: 3.1 g/dL (ref 2.2–3.9)
M-Spike, %: 1.1 g/dL — ABNORMAL HIGH
Total Protein ELP: 6.9 g/dL (ref 6.0–8.5)

## 2019-04-02 LAB — KAPPA/LAMBDA LIGHT CHAINS
Kappa free light chain: 21.5 mg/L — ABNORMAL HIGH (ref 3.3–19.4)
Kappa, lambda light chain ratio: 1.34 (ref 0.26–1.65)
Lambda free light chains: 16 mg/L (ref 5.7–26.3)

## 2019-04-03 LAB — IGG, IGA, IGM
IgA: 74 mg/dL (ref 64–422)
IgG (Immunoglobin G), Serum: 1689 mg/dL — ABNORMAL HIGH (ref 586–1602)
IgM (Immunoglobulin M), Srm: 129 mg/dL (ref 26–217)

## 2019-05-13 ENCOUNTER — Other Ambulatory Visit: Payer: Self-pay

## 2019-05-13 ENCOUNTER — Inpatient Hospital Stay: Payer: Medicare Other | Attending: Oncology

## 2019-05-13 ENCOUNTER — Inpatient Hospital Stay: Payer: Medicare Other

## 2019-05-13 VITALS — BP 152/76

## 2019-05-13 DIAGNOSIS — C9310 Chronic myelomonocytic leukemia not having achieved remission: Secondary | ICD-10-CM | POA: Insufficient documentation

## 2019-05-13 DIAGNOSIS — D630 Anemia in neoplastic disease: Secondary | ICD-10-CM | POA: Diagnosis present

## 2019-05-13 DIAGNOSIS — D649 Anemia, unspecified: Secondary | ICD-10-CM

## 2019-05-13 LAB — BASIC METABOLIC PANEL
Anion gap: 7 (ref 5–15)
BUN: 24 mg/dL — ABNORMAL HIGH (ref 8–23)
CO2: 24 mmol/L (ref 22–32)
Calcium: 9.1 mg/dL (ref 8.9–10.3)
Chloride: 105 mmol/L (ref 98–111)
Creatinine, Ser: 0.71 mg/dL (ref 0.44–1.00)
GFR calc Af Amer: >60 mL/min (ref >60)
GFR calc non Af Amer: >60 mL/min (ref >60)
Glucose, Bld: 89 mg/dL (ref 70–99)
Potassium: 4 mmol/L (ref 3.5–5.1)
Sodium: 136 mmol/L (ref 135–145)

## 2019-05-13 LAB — CBC WITH DIFFERENTIAL/PLATELET
Abs Immature Granulocytes: 0.04 10*3/uL (ref 0.00–0.07)
Basophils Absolute: 0 10*3/uL (ref 0.0–0.1)
Basophils Relative: 0 %
Eosinophils Absolute: 0 10*3/uL (ref 0.0–0.5)
Eosinophils Relative: 0 %
HCT: 29.7 % — ABNORMAL LOW (ref 36.0–46.0)
Hemoglobin: 9.4 g/dL — ABNORMAL LOW (ref 12.0–15.0)
Immature Granulocytes: 0 %
Lymphocytes Relative: 16 %
Lymphs Abs: 1.6 10*3/uL (ref 0.7–4.0)
MCH: 34.7 pg — ABNORMAL HIGH (ref 26.0–34.0)
MCHC: 31.6 g/dL (ref 30.0–36.0)
MCV: 109.6 fL — ABNORMAL HIGH (ref 80.0–100.0)
Monocytes Absolute: 2.8 10*3/uL — ABNORMAL HIGH (ref 0.1–1.0)
Monocytes Relative: 28 %
Neutro Abs: 5.6 10*3/uL (ref 1.7–7.7)
Neutrophils Relative %: 56 %
Platelets: 112 10*3/uL — ABNORMAL LOW (ref 150–400)
RBC: 2.71 MIL/uL — ABNORMAL LOW (ref 3.87–5.11)
RDW: 14.6 % (ref 11.5–15.5)
WBC: 10.1 10*3/uL (ref 4.0–10.5)
nRBC: 0 % (ref 0.0–0.2)

## 2019-05-13 MED ORDER — EPOETIN ALFA-EPBX 40000 UNIT/ML IJ SOLN
40000.0000 [IU] | Freq: Once | INTRAMUSCULAR | Status: AC
  Administered 2019-05-13: 12:00:00 40000 [IU] via SUBCUTANEOUS
  Filled 2019-05-13: qty 1

## 2019-05-14 LAB — PROTEIN ELECTROPHORESIS, SERUM
A/G Ratio: 1.1 (ref 0.7–1.7)
Albumin ELP: 3.6 g/dL (ref 2.9–4.4)
Alpha-1-Globulin: 0.3 g/dL (ref 0.0–0.4)
Alpha-2-Globulin: 0.8 g/dL (ref 0.4–1.0)
Beta Globulin: 0.8 g/dL (ref 0.7–1.3)
Gamma Globulin: 1.4 g/dL (ref 0.4–1.8)
Globulin, Total: 3.3 g/dL (ref 2.2–3.9)
M-Spike, %: 1.1 g/dL — ABNORMAL HIGH
Total Protein ELP: 6.9 g/dL (ref 6.0–8.5)

## 2019-05-14 LAB — IGG, IGA, IGM
IgA: 74 mg/dL (ref 64–422)
IgG (Immunoglobin G), Serum: 1725 mg/dL — ABNORMAL HIGH (ref 586–1602)
IgM (Immunoglobulin M), Srm: 128 mg/dL (ref 26–217)

## 2019-05-14 LAB — KAPPA/LAMBDA LIGHT CHAINS
Kappa free light chain: 23.4 mg/L — ABNORMAL HIGH (ref 3.3–19.4)
Kappa, lambda light chain ratio: 1.33 (ref 0.26–1.65)
Lambda free light chains: 17.6 mg/L (ref 5.7–26.3)

## 2019-06-28 NOTE — Progress Notes (Signed)
[place]  [phone]) [phone] Fax:(336) [phone]  ID: Vickie Howard OB: 1936/01/14  MR#: [id number]  FVC#:[id number]  Patient Care Team: Adin Hector, MD as PCP - General (Internal Medicine)  CHIEF COMPLAINT: CMML  INTERVAL HISTORY: Patient returns to clinic today for repeat laboratory work, further evaluation, and continuation of Retacrit.  Although patient does not offer specific complaints today, her daughter points out that she has noted increased weakness and fatigue as well as worsening memory.  She has no neurologic complaints. She denies any recent fevers or illnesses.  She has a good appetite and continues to gain weight.  She denies any chest pain, shortness of breath, cough, or hemoptysis. She denies any nausea, vomiting, constipation, or diarrhea.  She has no melena or hematochezia.  She has no urinary complaints.  Patient offers no further specific complaints today.  REVIEW OF SYSTEMS:   Review of Systems  Constitutional: Positive for malaise/fatigue. Negative for fever and weight loss.  Respiratory: Negative.  Negative for cough, hemoptysis and shortness of breath.   Cardiovascular: Negative.  Negative for chest pain and leg swelling.  Gastrointestinal: Negative.  Negative for abdominal pain, blood in stool and melena.  Genitourinary: Negative.  Negative for hematuria.  Musculoskeletal: Negative.  Negative for back pain and joint pain.  Skin: Negative.  Negative for rash.  Neurological: Positive for weakness. Negative for dizziness, sensory change, focal weakness and headaches.  Psychiatric/Behavioral: Positive for memory loss. The patient is not nervous/anxious.     As per HPI. Otherwise, a complete review of systems is negative.  PAST MEDICAL HISTORY: Past Medical History:  Diagnosis Date  . Anemia   . Arthritis   . Atrial fibrillation ([place])   . GERD (gastroesophageal reflux disease)   . Heart murmur   . History of hiatal hernia    . HLD (hyperlipidemia)   . HTN (hypertension)   . Occasional tremors    hands    PAST SURGICAL HISTORY: Past Surgical History:  Procedure Laterality Date  . JOINT REPLACEMENT     right total hip 2015  . Left total hip arthroplasty  10/12/2014  . PACEMAKER INSERTION N/A 08/12/2015   Procedure: INSERTION PACEMAKER attempt unable to obtain access ;  Surgeon: Isaias Cowman, MD;  Location: [facility] ORS;  Service: Cardiovascular;  Laterality: N/A;  . TOTAL HIP ARTHROPLASTY Left 10/12/2014   Procedure: TOTAL HIP ARTHROPLASTY;  Surgeon: Dereck Leep, MD;  Location: [facility] ORS;  Service: Orthopedics;  Laterality: Left;  . TUBAL LIGATION      FAMILY HISTORY: Family History  Problem Relation Age of Onset  . Aneurysm Father   . Breast cancer Daughter     ADVANCED DIRECTIVES (Y/N):  N  HEALTH MAINTENANCE: Social History   Tobacco Use  . Smoking status: Never Smoker  . Smokeless tobacco: Never Used  Substance Use Topics  . Alcohol use: No  . Drug use: No     Colonoscopy:  PAP:  Bone density:  Lipid panel:  Allergies  Allergen Reactions  . Codeine Sulfate Other (See Comments)    Reaction:  Syncope  . Levaquin [Levofloxacin] Nausea And Vomiting  . Oxycodone Other (See Comments)    Reaction:  Disoriented     Current Outpatient Medications  Medication Sig Dispense Refill  . acetaminophen (TYLENOL) 500 MG tablet Take 1,000 mg by mouth every 6 (six) hours as needed for mild pain or headache.    Marland Kitchen aspirin EC 81 MG tablet Take 81 mg by mouth every  other day.     Marland Kitchen atorvastatin (LIPITOR) 20 MG tablet Take 20 mg by mouth daily.    . Cyanocobalamin (VITAMIN B 12 PO) Take 1 mcg by mouth daily.    Marland Kitchen docusate sodium (COLACE) 100 MG capsule Take 100 mg by mouth 2 (two) times daily.    . feeding supplement, ENSURE ENLIVE, (ENSURE ENLIVE) LIQD Take 237 mLs by mouth 2 (two) times daily between meals. 237 mL 12  . ferrous sulfate 325 (65 FE) MG tablet Take 325 mg by mouth daily with  breakfast.    . gabapentin (NEURONTIN) 100 MG capsule Take 100 mg by mouth daily.     . hydroxychloroquine (PLAQUENIL) 200 MG tablet Take 1 tablet by mouth 1 day or 1 dose.    Marland Kitchen LANOXIN 62.5 MCG TABS TK 1 T PO ONCE D  11  . lisinopril (PRINIVIL,ZESTRIL) 2.5 MG tablet Take 2.5 mg by mouth daily.   1  . meloxicam (MOBIC) 7.5 MG tablet Take 7.5 mg by mouth daily.    . mirtazapine (REMERON) 15 MG tablet Take 1 tablet by mouth 1 day or 1 dose.    . Multiple Vitamin (MULTI-VITAMIN) tablet Take 1 tablet by mouth 1 day or 1 dose.    . Multiple Vitamins-Minerals (PRESERVISION AREDS 2) CAPS Take 1 capsule by mouth daily.    Marland Kitchen omeprazole (PRILOSEC) 20 MG capsule Take 20 mg by mouth daily.    . sertraline (ZOLOFT) 50 MG tablet Take 75 mg by mouth daily. Patient taking 39m daily per Dr. KJens Som     No current facility-administered medications for this visit.    OBJECTIVE: Vitals:   07/02/19 1024  BP: (!) 169/63  Pulse: 75  Temp: 98 F (36.7 C)     Body mass index is 20.74 kg/m.    ECOG FS:0 - Asymptomatic  General: Thin, no acute distress. Eyes: Pink conjunctiva, anicteric sclera. HEENT: Normocephalic, moist mucous membranes. Lungs: No audible wheezing or coughing. Heart: Regular rate and rhythm. Abdomen: Soft, nontender, no obvious distention. Musculoskeletal: No edema, cyanosis, or clubbing. Neuro: Alert, answering all questions appropriately. Cranial nerves grossly intact. Skin: No rashes or petechiae noted. Psych: Normal affect.   LAB RESULTS:  Lab Results  Component Value Date   NA 140 07/02/2019   K 4.0 07/02/2019   CL 107 07/02/2019   CO2 25 07/02/2019   GLUCOSE 99 07/02/2019   BUN 26 (H) 07/02/2019   CREATININE 0.65 07/02/2019   CALCIUM 9.3 07/02/2019   PROT 6.9 10/29/2014   ALBUMIN 3.2 (L) 10/29/2014   AST 18 10/29/2014   ALT 18 10/29/2014   ALKPHOS 85 10/29/2014   BILITOT 0.2 (L) 10/29/2014   GFRNONAA >60 07/02/2019   GFRAA >60 07/02/2019    Lab Results   Component Value Date   WBC 10.9 (H) 07/02/2019   NEUTROABS 6.5 07/02/2019   HGB 9.5 (L) 07/02/2019   HCT 29.1 (L) 07/02/2019   MCV 107.4 (H) 07/02/2019   PLT 119 (L) 07/02/2019     STUDIES: No results found.  ASSESSMENT: CMML.  PLAN:   1. CMML: Confirmed on bone marrow biopsy from June 18, 2018.  Patient noted to have a hypercellular marrow with mild dysplasia in all 3 lineages.  No increased blasts.  Peripheral blood continues to show an increased absolute monocytosis of 2.8.  Given patient's advanced age and she is asymptomatic, she does not require treatment at this time.  Can consider Vidaza or Dacogen in the future if absolutely necessary.  Continue supportive care with Retacrit every 6 weeks.   2.  Anemia: Possibly secondary to CMML.  Proceed with Retacrit today.  Return to clinic in 6 weeks for laboratory work and Retacrit if her hemoglobin remains below 10.0.  Patient will then return to clinic in 3 months for further evaluation and continuation of treatment. 3.  Elevated MCV: B12 and folate are within normal limits.  Secondary to CMML.   3.  Thrombocytopenia: Mild, monitor.  Patient platelet count 119 today. 4.  Leukocytosis: Mild, monitor. 5.  Weight loss: Resolved.  Patient is now gaining weight.  I spent a total of 30 minutes reviewing chart data, face-to-face evaluation with the patient, counseling and coordination of care as detailed above.    Patient expressed understanding and was in agreement with this plan. She also understands that She can call clinic at any time with any questions, concerns, or complaints.    Lloyd Huger, MD   07/04/2019 5:15 AM

## 2019-07-01 ENCOUNTER — Other Ambulatory Visit: Payer: Self-pay

## 2019-07-01 ENCOUNTER — Other Ambulatory Visit: Payer: Self-pay | Admitting: Emergency Medicine

## 2019-07-01 DIAGNOSIS — C9310 Chronic myelomonocytic leukemia not having achieved remission: Secondary | ICD-10-CM

## 2019-07-01 NOTE — Progress Notes (Signed)
Patient pre screened for office appointment, no questions or concerns today. Patient reminded of upcoming appointment time and date. 

## 2019-07-02 ENCOUNTER — Inpatient Hospital Stay: Payer: Medicare Other | Attending: Oncology

## 2019-07-02 ENCOUNTER — Inpatient Hospital Stay (HOSPITAL_BASED_OUTPATIENT_CLINIC_OR_DEPARTMENT_OTHER): Payer: Medicare Other | Admitting: Oncology

## 2019-07-02 ENCOUNTER — Inpatient Hospital Stay: Payer: Medicare Other

## 2019-07-02 ENCOUNTER — Other Ambulatory Visit: Payer: Self-pay

## 2019-07-02 VITALS — BP 169/63 | HR 75 | Temp 98.0°F | Wt 102.7 lb

## 2019-07-02 DIAGNOSIS — C9310 Chronic myelomonocytic leukemia not having achieved remission: Secondary | ICD-10-CM | POA: Insufficient documentation

## 2019-07-02 DIAGNOSIS — D630 Anemia in neoplastic disease: Secondary | ICD-10-CM | POA: Diagnosis not present

## 2019-07-02 DIAGNOSIS — D696 Thrombocytopenia, unspecified: Secondary | ICD-10-CM | POA: Insufficient documentation

## 2019-07-02 LAB — BASIC METABOLIC PANEL
Anion gap: 8 (ref 5–15)
BUN: 26 mg/dL — ABNORMAL HIGH (ref 8–23)
CO2: 25 mmol/L (ref 22–32)
Calcium: 9.3 mg/dL (ref 8.9–10.3)
Chloride: 107 mmol/L (ref 98–111)
Creatinine, Ser: 0.65 mg/dL (ref 0.44–1.00)
GFR calc Af Amer: >60 mL/min (ref >60)
GFR calc non Af Amer: >60 mL/min (ref >60)
Glucose, Bld: 99 mg/dL (ref 70–99)
Potassium: 4 mmol/L (ref 3.5–5.1)
Sodium: 140 mmol/L (ref 135–145)

## 2019-07-02 LAB — CBC WITH DIFFERENTIAL/PLATELET
Abs Immature Granulocytes: 0.05 10*3/uL (ref 0.00–0.07)
Basophils Absolute: 0 10*3/uL (ref 0.0–0.1)
Basophils Relative: 0 %
Eosinophils Absolute: 0 10*3/uL (ref 0.0–0.5)
Eosinophils Relative: 0 %
HCT: 29.1 % — ABNORMAL LOW (ref 36.0–46.0)
Hemoglobin: 9.5 g/dL — ABNORMAL LOW (ref 12.0–15.0)
Immature Granulocytes: 1 %
Lymphocytes Relative: 14 %
Lymphs Abs: 1.5 10*3/uL (ref 0.7–4.0)
MCH: 35.1 pg — ABNORMAL HIGH (ref 26.0–34.0)
MCHC: 32.6 g/dL (ref 30.0–36.0)
MCV: 107.4 fL — ABNORMAL HIGH (ref 80.0–100.0)
Monocytes Absolute: 2.8 10*3/uL — ABNORMAL HIGH (ref 0.1–1.0)
Monocytes Relative: 25 %
Neutro Abs: 6.5 10*3/uL (ref 1.7–7.7)
Neutrophils Relative %: 60 %
Platelets: 119 10*3/uL — ABNORMAL LOW (ref 150–400)
RBC: 2.71 MIL/uL — ABNORMAL LOW (ref 3.87–5.11)
RDW: 14.6 % (ref 11.5–15.5)
WBC: 10.9 10*3/uL — ABNORMAL HIGH (ref 4.0–10.5)
nRBC: 0 % (ref 0.0–0.2)

## 2019-07-02 MED ORDER — EPOETIN ALFA-EPBX 40000 UNIT/ML IJ SOLN
40000.0000 [IU] | Freq: Once | INTRAMUSCULAR | Status: AC
  Administered 2019-07-02: 40000 [IU] via SUBCUTANEOUS
  Filled 2019-07-02: qty 1

## 2019-07-03 LAB — PROTEIN ELECTROPHORESIS, SERUM
A/G Ratio: 1.2 (ref 0.7–1.7)
Albumin ELP: 3.8 g/dL (ref 2.9–4.4)
Alpha-1-Globulin: 0.2 g/dL (ref 0.0–0.4)
Alpha-2-Globulin: 0.8 g/dL (ref 0.4–1.0)
Beta Globulin: 0.8 g/dL (ref 0.7–1.3)
Gamma Globulin: 1.4 g/dL (ref 0.4–1.8)
Globulin, Total: 3.2 g/dL (ref 2.2–3.9)
M-Spike, %: 1.1 g/dL — ABNORMAL HIGH
Total Protein ELP: 7 g/dL (ref 6.0–8.5)

## 2019-07-04 LAB — IGG, IGA, IGM
IgA: 61 mg/dL — ABNORMAL LOW (ref 64–422)
IgG (Immunoglobin G), Serum: 1432 mg/dL (ref 586–1602)
IgM (Immunoglobulin M), Srm: 113 mg/dL (ref 26–217)

## 2019-07-04 LAB — KAPPA/LAMBDA LIGHT CHAINS
Kappa free light chain: 23.2 mg/L — ABNORMAL HIGH (ref 3.3–19.4)
Kappa, lambda light chain ratio: 1.15 (ref 0.26–1.65)
Lambda free light chains: 20.2 mg/L (ref 5.7–26.3)

## 2019-07-28 ENCOUNTER — Inpatient Hospital Stay: Payer: Medicare Other

## 2019-08-13 ENCOUNTER — Other Ambulatory Visit: Payer: Self-pay

## 2019-08-13 ENCOUNTER — Inpatient Hospital Stay: Payer: Medicare Other | Attending: Oncology

## 2019-08-13 ENCOUNTER — Inpatient Hospital Stay: Payer: Medicare Other

## 2019-08-13 VITALS — BP 148/63 | HR 60

## 2019-08-13 DIAGNOSIS — C9310 Chronic myelomonocytic leukemia not having achieved remission: Secondary | ICD-10-CM

## 2019-08-13 DIAGNOSIS — D630 Anemia in neoplastic disease: Secondary | ICD-10-CM | POA: Insufficient documentation

## 2019-08-13 LAB — CBC WITH DIFFERENTIAL/PLATELET
Abs Immature Granulocytes: 0.03 10*3/uL (ref 0.00–0.07)
Basophils Absolute: 0 10*3/uL (ref 0.0–0.1)
Basophils Relative: 0 %
Eosinophils Absolute: 0 10*3/uL (ref 0.0–0.5)
Eosinophils Relative: 0 %
HCT: 31.2 % — ABNORMAL LOW (ref 36.0–46.0)
Hemoglobin: 9.7 g/dL — ABNORMAL LOW (ref 12.0–15.0)
Immature Granulocytes: 0 %
Lymphocytes Relative: 20 %
Lymphs Abs: 1.6 10*3/uL (ref 0.7–4.0)
MCH: 34 pg (ref 26.0–34.0)
MCHC: 31.1 g/dL (ref 30.0–36.0)
MCV: 109.5 fL — ABNORMAL HIGH (ref 80.0–100.0)
Monocytes Absolute: 2.1 10*3/uL — ABNORMAL HIGH (ref 0.1–1.0)
Monocytes Relative: 25 %
Neutro Abs: 4.5 10*3/uL (ref 1.7–7.7)
Neutrophils Relative %: 55 %
Platelets: 104 10*3/uL — ABNORMAL LOW (ref 150–400)
RBC: 2.85 MIL/uL — ABNORMAL LOW (ref 3.87–5.11)
RDW: 14.8 % (ref 11.5–15.5)
WBC: 8.3 10*3/uL (ref 4.0–10.5)
nRBC: 0 % (ref 0.0–0.2)

## 2019-08-13 LAB — BASIC METABOLIC PANEL
Anion gap: 9 (ref 5–15)
BUN: 20 mg/dL (ref 8–23)
CO2: 24 mmol/L (ref 22–32)
Calcium: 9.4 mg/dL (ref 8.9–10.3)
Chloride: 104 mmol/L (ref 98–111)
Creatinine, Ser: 0.65 mg/dL (ref 0.44–1.00)
GFR calc Af Amer: >60 mL/min (ref >60)
GFR calc non Af Amer: >60 mL/min (ref >60)
Glucose, Bld: 104 mg/dL — ABNORMAL HIGH (ref 70–99)
Potassium: 3.9 mmol/L (ref 3.5–5.1)
Sodium: 137 mmol/L (ref 135–145)

## 2019-08-13 MED ORDER — EPOETIN ALFA-EPBX 40000 UNIT/ML IJ SOLN
40000.0000 [IU] | Freq: Once | INTRAMUSCULAR | Status: AC
  Administered 2019-08-13: 12:00:00 40000 [IU] via SUBCUTANEOUS
  Filled 2019-08-13: qty 1

## 2019-08-14 LAB — PROTEIN ELECTROPHORESIS, SERUM
A/G Ratio: 1.1 (ref 0.7–1.7)
Albumin ELP: 3.9 g/dL (ref 2.9–4.4)
Alpha-1-Globulin: 0.2 g/dL (ref 0.0–0.4)
Alpha-2-Globulin: 0.8 g/dL (ref 0.4–1.0)
Beta Globulin: 0.8 g/dL (ref 0.7–1.3)
Gamma Globulin: 1.6 g/dL (ref 0.4–1.8)
Globulin, Total: 3.4 g/dL (ref 2.2–3.9)
M-Spike, %: 1.2 g/dL — ABNORMAL HIGH
Total Protein ELP: 7.3 g/dL (ref 6.0–8.5)

## 2019-08-14 LAB — KAPPA/LAMBDA LIGHT CHAINS
Kappa free light chain: 26.3 mg/L — ABNORMAL HIGH (ref 3.3–19.4)
Kappa, lambda light chain ratio: 1.36 (ref 0.26–1.65)
Lambda free light chains: 19.3 mg/L (ref 5.7–26.3)

## 2019-08-14 LAB — IGG, IGA, IGM
IgA: 71 mg/dL (ref 64–422)
IgG (Immunoglobin G), Serum: 1625 mg/dL — ABNORMAL HIGH (ref 586–1602)
IgM (Immunoglobulin M), Srm: 125 mg/dL (ref 26–217)

## 2019-09-18 ENCOUNTER — Other Ambulatory Visit: Payer: Self-pay | Admitting: Neurology

## 2019-09-18 DIAGNOSIS — R4189 Other symptoms and signs involving cognitive functions and awareness: Secondary | ICD-10-CM

## 2019-09-27 NOTE — Progress Notes (Signed)
[place]  Telephone:(336) [phone] Fax:(336) [phone]  ID: Vickie Howard OB: 1935-06-10  MR#: [id number]  IHK#:[id number]  Patient Care Team: Adin Hector, MD as PCP - General (Internal Medicine)  CHIEF COMPLAINT: CMML  INTERVAL HISTORY: Patient returns to clinic today for repeat laboratory work, further evaluation, and continuation of Retacrit.  She continues to have increased issues with memory that is actively being worked up by neurology.  She has chronic weakness and fatigue, that is essentially unchanged.  She has no focal neurologic complaints. She denies any recent fevers or illnesses.  She has a good appetite and continues to gain weight.  She denies any chest pain, shortness of breath, cough, or hemoptysis. She denies any nausea, vomiting, constipation, or diarrhea.  She has no melena or hematochezia.  She has no urinary complaints.  Patient offers no further specific complaints today.  REVIEW OF SYSTEMS:   Review of Systems  Constitutional: Positive for malaise/fatigue. Negative for fever and weight loss.  Respiratory: Negative.  Negative for cough, hemoptysis and shortness of breath.   Cardiovascular: Negative.  Negative for chest pain and leg swelling.  Gastrointestinal: Negative.  Negative for abdominal pain, blood in stool and melena.  Genitourinary: Negative.  Negative for hematuria.  Musculoskeletal: Negative.  Negative for back pain and joint pain.  Skin: Negative.  Negative for rash.  Neurological: Positive for weakness. Negative for dizziness, sensory change, focal weakness and headaches.  Psychiatric/Behavioral: Positive for memory loss. The patient is not nervous/anxious.     As per HPI. Otherwise, a complete review of systems is negative.  PAST MEDICAL HISTORY: Past Medical History:  Diagnosis Date  . Anemia   . Arthritis   . Atrial fibrillation ([place])   . GERD (gastroesophageal reflux disease)   . Heart murmur   . History of  hiatal hernia   . HLD (hyperlipidemia)   . HTN (hypertension)   . Occasional tremors    hands    PAST SURGICAL HISTORY: Past Surgical History:  Procedure Laterality Date  . JOINT REPLACEMENT     right total hip 2015  . Left total hip arthroplasty  10/12/2014  . PACEMAKER INSERTION N/A 08/12/2015   Procedure: INSERTION PACEMAKER attempt unable to obtain access ;  Surgeon: Isaias Cowman, MD;  Location: [facility] ORS;  Service: Cardiovascular;  Laterality: N/A;  . TOTAL HIP ARTHROPLASTY Left 10/12/2014   Procedure: TOTAL HIP ARTHROPLASTY;  Surgeon: Dereck Leep, MD;  Location: [facility] ORS;  Service: Orthopedics;  Laterality: Left;  . TUBAL LIGATION      FAMILY HISTORY: Family History  Problem Relation Age of Onset  . Aneurysm Father   . Breast cancer Daughter     ADVANCED DIRECTIVES (Y/N):  N  HEALTH MAINTENANCE: Social History   Tobacco Use  . Smoking status: Never Smoker  . Smokeless tobacco: Never Used  Substance Use Topics  . Alcohol use: No  . Drug use: No     Colonoscopy:  PAP:  Bone density:  Lipid panel:  Allergies  Allergen Reactions  . Codeine Sulfate Other (See Comments)    Reaction:  Syncope  . Levaquin [Levofloxacin] Nausea And Vomiting  . Oxycodone Other (See Comments)    Reaction:  Disoriented     Current Outpatient Medications  Medication Sig Dispense Refill  . acetaminophen (TYLENOL) 500 MG tablet Take 1,000 mg by mouth every 6 (six) hours as needed for mild pain or headache.    Marland Kitchen aspirin EC 81 MG tablet Take 81 mg  by mouth every other day.     Marland Kitchen atorvastatin (LIPITOR) 20 MG tablet Take 20 mg by mouth daily.    . Cyanocobalamin (VITAMIN B 12 PO) Take 1 mcg by mouth daily.    Marland Kitchen docusate sodium (COLACE) 100 MG capsule Take 100 mg by mouth 2 (two) times daily.    Marland Kitchen donepezil (ARICEPT) 5 MG tablet Take 5 mg by mouth daily.    . feeding supplement, ENSURE ENLIVE, (ENSURE ENLIVE) LIQD Take 237 mLs by mouth 2 (two) times daily between meals. 237 mL 12    . ferrous sulfate 325 (65 FE) MG tablet Take 325 mg by mouth daily with breakfast.    . gabapentin (NEURONTIN) 100 MG capsule Take 100 mg by mouth daily.     . hydroxychloroquine (PLAQUENIL) 200 MG tablet Take 1 tablet by mouth 1 day or 1 dose.    Marland Kitchen LANOXIN 62.5 MCG TABS TK 1 T PO ONCE D  11  . lisinopril (PRINIVIL,ZESTRIL) 2.5 MG tablet Take 2.5 mg by mouth daily.   1  . meloxicam (MOBIC) 7.5 MG tablet Take 7.5 mg by mouth daily.    . mirtazapine (REMERON) 15 MG tablet Take 1 tablet by mouth 1 day or 1 dose.    . Multiple Vitamin (MULTI-VITAMIN) tablet Take 1 tablet by mouth 1 day or 1 dose.    . Multiple Vitamins-Minerals (PRESERVISION AREDS 2) CAPS Take 1 capsule by mouth daily.    Marland Kitchen omeprazole (PRILOSEC) 20 MG capsule Take 20 mg by mouth daily.    . sertraline (ZOLOFT) 50 MG tablet Take 75 mg by mouth daily. Patient taking '75mg'$  daily per Dr. Jens Som      No current facility-administered medications for this visit.    OBJECTIVE: Vitals:   10/01/19 1112  BP: (!) 146/53  Pulse: 63  Resp: 20  Temp: (!) 97.3 F (36.3 C)  SpO2: 98%     Body mass index is 20.5 kg/m.    ECOG FS:0 - Asymptomatic  General: Well-developed, well-nourished, no acute distress. Eyes: Pink conjunctiva, anicteric sclera. HEENT: Normocephalic, moist mucous membranes. Lungs: No audible wheezing or coughing. Heart: Regular rate and rhythm. Abdomen: Soft, nontender, no obvious distention. Musculoskeletal: No edema, cyanosis, or clubbing. Neuro: Alert, answering all questions appropriately. Cranial nerves grossly intact. Skin: No rashes or petechiae noted. Psych: Normal affect.   LAB RESULTS:  Lab Results  Component Value Date   NA 141 10/01/2019   K 3.9 10/01/2019   CL 108 10/01/2019   CO2 24 10/01/2019   GLUCOSE 100 (H) 10/01/2019   BUN 28 (H) 10/01/2019   CREATININE 0.72 10/01/2019   CALCIUM 8.9 10/01/2019   PROT 6.9 10/29/2014   ALBUMIN 3.2 (L) 10/29/2014   AST 18 10/29/2014   ALT 18  10/29/2014   ALKPHOS 85 10/29/2014   BILITOT 0.2 (L) 10/29/2014   GFRNONAA >60 10/01/2019   GFRAA >60 10/01/2019    Lab Results  Component Value Date   WBC 9.1 10/01/2019   NEUTROABS 5.0 10/01/2019   HGB 9.5 (L) 10/01/2019   HCT 28.7 (L) 10/01/2019   MCV 106.3 (H) 10/01/2019   PLT 110 (L) 10/01/2019     STUDIES: CT HEAD WO CONTRAST  Result Date: 09/29/2019 CLINICAL DATA:  Memory loss. EXAM: CT HEAD WITHOUT CONTRAST TECHNIQUE: Contiguous axial images were obtained from the base of the skull through the vertex without intravenous contrast. COMPARISON:  MRI of the brain Oct 11, 2009 FINDINGS: Brain: No evidence of acute infarction, hemorrhage, hydrocephalus, extra-axial  collection or mass lesion/mass effect. Remote lacunar infarct in the periventricular white matter adjacent to the body of the left lateral ventricle. Mild prominence of the cerebral sulci reflecting mild age-appropriate parenchymal volume loss. Vascular: Calcified plaques in the bilateral carotid siphons Skull: Normal. Negative for fracture or focal lesion. Sinuses/Orbits: No acute finding. Other: None IMPRESSION: 1. No acute intracranial process. 2. Remote lacunar infarct in the periventricular white matter adjacent to the body of the left lateral ventricle. Electronically Signed   By: Pedro Earls M.D.   On: 09/29/2019 14:43    ASSESSMENT: CMML.  PLAN:   1. CMML: Confirmed on bone marrow biopsy from June 18, 2018.  Patient noted to have a hypercellular marrow with mild dysplasia in all 3 lineages.  No increased blasts.  Peripheral blood continues to show a persistent absolute monocytosis of 2.2.  Given patient's advanced age and she is asymptomatic, she does not require treatment at this time.  Can consider Vidaza or Dacogen in the future if absolutely necessary.  Continue supportive care with Retacrit every 6 weeks.   2.  Anemia: Possibly secondary to CMML.  Proceed with 40,000 units Retacrit today.   Return to clinic in 6 weeks for laboratory work and Retacrit if her hemoglobin remains below 10.0.  Patient will then return to clinic in 3 months for further evaluation and continuation of treatment.   3.  Elevated MCV: B12 and folate are within normal limits.  Secondary to CMML.   3.  Thrombocytopenia: Chronic and unchanged.  Platelet count 110 today. 4.  Leukocytosis: Resolved. 5.  Weight loss: Resolved.  Patient is now gaining weight.  I spent a total of 30 minutes reviewing chart data, face-to-face evaluation with the patient, counseling and coordination of care as detailed above.     Patient expressed understanding and was in agreement with this plan. She also understands that She can call clinic at any time with any questions, concerns, or complaints.    Lloyd Huger, MD   10/02/2019 10:14 AM

## 2019-09-29 ENCOUNTER — Ambulatory Visit
Admission: RE | Admit: 2019-09-29 | Discharge: 2019-09-29 | Disposition: A | Discharge status: Home / Self Care | Payer: Medicare Other | Source: Ambulatory Visit | Attending: Neurology | Admitting: Neurology

## 2019-09-29 ENCOUNTER — Other Ambulatory Visit: Payer: Self-pay

## 2019-09-29 DIAGNOSIS — R4189 Other symptoms and signs involving cognitive functions and awareness: Secondary | ICD-10-CM | POA: Insufficient documentation

## 2019-09-29 DIAGNOSIS — Z8673 Personal history of transient ischemic attack (TIA), and cerebral infarction without residual deficits: Secondary | ICD-10-CM | POA: Diagnosis not present

## 2019-09-30 ENCOUNTER — Encounter: Payer: Self-pay | Admitting: Oncology

## 2019-09-30 NOTE — Progress Notes (Signed)
Pt reports having some red spots on her right lower arm that have come up. No other complaints or concerns.

## 2019-10-01 ENCOUNTER — Inpatient Hospital Stay: Payer: Medicare Other | Attending: Oncology

## 2019-10-01 ENCOUNTER — Encounter: Payer: Self-pay | Admitting: Oncology

## 2019-10-01 ENCOUNTER — Inpatient Hospital Stay: Payer: Medicare Other

## 2019-10-01 ENCOUNTER — Inpatient Hospital Stay (HOSPITAL_BASED_OUTPATIENT_CLINIC_OR_DEPARTMENT_OTHER): Payer: Medicare Other | Admitting: Oncology

## 2019-10-01 ENCOUNTER — Other Ambulatory Visit: Payer: Self-pay

## 2019-10-01 VITALS — BP 146/53 | HR 63 | Temp 97.3°F | Resp 20 | Wt 101.5 lb

## 2019-10-01 DIAGNOSIS — I10 Essential (primary) hypertension: Secondary | ICD-10-CM | POA: Diagnosis not present

## 2019-10-01 DIAGNOSIS — D630 Anemia in neoplastic disease: Secondary | ICD-10-CM | POA: Diagnosis not present

## 2019-10-01 DIAGNOSIS — D696 Thrombocytopenia, unspecified: Secondary | ICD-10-CM | POA: Insufficient documentation

## 2019-10-01 DIAGNOSIS — C9310 Chronic myelomonocytic leukemia not having achieved remission: Secondary | ICD-10-CM

## 2019-10-01 DIAGNOSIS — I4891 Unspecified atrial fibrillation: Secondary | ICD-10-CM | POA: Insufficient documentation

## 2019-10-01 DIAGNOSIS — E785 Hyperlipidemia, unspecified: Secondary | ICD-10-CM | POA: Diagnosis not present

## 2019-10-01 DIAGNOSIS — K219 Gastro-esophageal reflux disease without esophagitis: Secondary | ICD-10-CM | POA: Diagnosis not present

## 2019-10-01 DIAGNOSIS — D649 Anemia, unspecified: Secondary | ICD-10-CM

## 2019-10-01 LAB — CBC WITH DIFFERENTIAL/PLATELET
Abs Immature Granulocytes: 0.04 10*3/uL (ref 0.00–0.07)
Basophils Absolute: 0 10*3/uL (ref 0.0–0.1)
Basophils Relative: 0 %
Eosinophils Absolute: 0 10*3/uL (ref 0.0–0.5)
Eosinophils Relative: 0 %
HCT: 28.7 % — ABNORMAL LOW (ref 36.0–46.0)
Hemoglobin: 9.5 g/dL — ABNORMAL LOW (ref 12.0–15.0)
Immature Granulocytes: 0 %
Lymphocytes Relative: 19 %
Lymphs Abs: 1.8 10*3/uL (ref 0.7–4.0)
MCH: 35.2 pg — ABNORMAL HIGH (ref 26.0–34.0)
MCHC: 33.1 g/dL (ref 30.0–36.0)
MCV: 106.3 fL — ABNORMAL HIGH (ref 80.0–100.0)
Monocytes Absolute: 2.2 10*3/uL — ABNORMAL HIGH (ref 0.1–1.0)
Monocytes Relative: 25 %
Neutro Abs: 5 10*3/uL (ref 1.7–7.7)
Neutrophils Relative %: 56 %
Platelets: 110 10*3/uL — ABNORMAL LOW (ref 150–400)
RBC: 2.7 MIL/uL — ABNORMAL LOW (ref 3.87–5.11)
RDW: 15.4 % (ref 11.5–15.5)
WBC: 9.1 10*3/uL (ref 4.0–10.5)
nRBC: 0 % (ref 0.0–0.2)

## 2019-10-01 LAB — BASIC METABOLIC PANEL
Anion gap: 9 (ref 5–15)
BUN: 28 mg/dL — ABNORMAL HIGH (ref 8–23)
CO2: 24 mmol/L (ref 22–32)
Calcium: 8.9 mg/dL (ref 8.9–10.3)
Chloride: 108 mmol/L (ref 98–111)
Creatinine, Ser: 0.72 mg/dL (ref 0.44–1.00)
GFR calc Af Amer: >60 mL/min (ref >60)
GFR calc non Af Amer: >60 mL/min (ref >60)
Glucose, Bld: 100 mg/dL — ABNORMAL HIGH (ref 70–99)
Potassium: 3.9 mmol/L (ref 3.5–5.1)
Sodium: 141 mmol/L (ref 135–145)

## 2019-10-01 MED ORDER — EPOETIN ALFA-EPBX 40000 UNIT/ML IJ SOLN
40000.0000 [IU] | Freq: Once | INTRAMUSCULAR | Status: AC
  Administered 2019-10-01: 12:00:00 40000 [IU] via SUBCUTANEOUS
  Filled 2019-10-01: qty 1

## 2019-10-02 LAB — PROTEIN ELECTROPHORESIS, SERUM
A/G Ratio: 1.2 (ref 0.7–1.7)
Albumin ELP: 3.9 g/dL (ref 2.9–4.4)
Alpha-1-Globulin: 0.2 g/dL (ref 0.0–0.4)
Alpha-2-Globulin: 0.7 g/dL (ref 0.4–1.0)
Beta Globulin: 0.8 g/dL (ref 0.7–1.3)
Gamma Globulin: 1.5 g/dL (ref 0.4–1.8)
Globulin, Total: 3.2 g/dL (ref 2.2–3.9)
M-Spike, %: 1 g/dL — ABNORMAL HIGH
Total Protein ELP: 7.1 g/dL (ref 6.0–8.5)

## 2019-10-02 LAB — KAPPA/LAMBDA LIGHT CHAINS
Kappa free light chain: 26.2 mg/L — ABNORMAL HIGH (ref 3.3–19.4)
Kappa, lambda light chain ratio: 1.39 (ref 0.26–1.65)
Lambda free light chains: 18.9 mg/L (ref 5.7–26.3)

## 2019-10-02 LAB — IGG, IGA, IGM
IgA: 69 mg/dL (ref 64–422)
IgG (Immunoglobin G), Serum: 1615 mg/dL — ABNORMAL HIGH (ref 586–1602)
IgM (Immunoglobulin M), Srm: 117 mg/dL (ref 26–217)

## 2019-10-13 ENCOUNTER — Other Ambulatory Visit: Payer: Self-pay | Admitting: Internal Medicine

## 2019-10-13 DIAGNOSIS — N63 Unspecified lump in unspecified breast: Secondary | ICD-10-CM

## 2019-10-17 ENCOUNTER — Ambulatory Visit
Admission: RE | Admit: 2019-10-17 | Discharge: 2019-10-17 | Disposition: A | Discharge status: Home / Self Care | Payer: Medicare Other | Source: Ambulatory Visit | Attending: Internal Medicine | Admitting: Internal Medicine

## 2019-10-17 DIAGNOSIS — N63 Unspecified lump in unspecified breast: Secondary | ICD-10-CM

## 2019-11-12 ENCOUNTER — Other Ambulatory Visit: Payer: Self-pay

## 2019-11-12 ENCOUNTER — Inpatient Hospital Stay: Payer: Medicare Other | Attending: Oncology

## 2019-11-12 ENCOUNTER — Inpatient Hospital Stay: Payer: Medicare Other

## 2019-11-12 DIAGNOSIS — D630 Anemia in neoplastic disease: Secondary | ICD-10-CM | POA: Diagnosis present

## 2019-11-12 DIAGNOSIS — C9310 Chronic myelomonocytic leukemia not having achieved remission: Secondary | ICD-10-CM | POA: Diagnosis not present

## 2019-11-12 LAB — CBC WITH DIFFERENTIAL/PLATELET
Abs Immature Granulocytes: 0.02 10*3/uL (ref 0.00–0.07)
Basophils Absolute: 0 10*3/uL (ref 0.0–0.1)
Basophils Relative: 0 %
Eosinophils Absolute: 0 10*3/uL (ref 0.0–0.5)
Eosinophils Relative: 0 %
HCT: 28.6 % — ABNORMAL LOW (ref 36.0–46.0)
Hemoglobin: 9.5 g/dL — ABNORMAL LOW (ref 12.0–15.0)
Immature Granulocytes: 0 %
Lymphocytes Relative: 18 %
Lymphs Abs: 1.3 10*3/uL (ref 0.7–4.0)
MCH: 35.2 pg — ABNORMAL HIGH (ref 26.0–34.0)
MCHC: 33.2 g/dL (ref 30.0–36.0)
MCV: 105.9 fL — ABNORMAL HIGH (ref 80.0–100.0)
Monocytes Absolute: 1.9 10*3/uL — ABNORMAL HIGH (ref 0.1–1.0)
Monocytes Relative: 27 %
Neutro Abs: 3.9 10*3/uL (ref 1.7–7.7)
Neutrophils Relative %: 55 %
Platelets: 92 10*3/uL — ABNORMAL LOW (ref 150–400)
RBC: 2.7 MIL/uL — ABNORMAL LOW (ref 3.87–5.11)
RDW: 15.4 % (ref 11.5–15.5)
WBC: 7.1 10*3/uL (ref 4.0–10.5)
nRBC: 0 % (ref 0.0–0.2)

## 2019-11-12 LAB — BASIC METABOLIC PANEL
Anion gap: 9 (ref 5–15)
BUN: 24 mg/dL — ABNORMAL HIGH (ref 8–23)
CO2: 25 mmol/L (ref 22–32)
Calcium: 9.1 mg/dL (ref 8.9–10.3)
Chloride: 107 mmol/L (ref 98–111)
Creatinine, Ser: 0.8 mg/dL (ref 0.44–1.00)
GFR calc Af Amer: >60 mL/min (ref >60)
GFR calc non Af Amer: >60 mL/min (ref >60)
Glucose, Bld: 91 mg/dL (ref 70–99)
Potassium: 4.1 mmol/L (ref 3.5–5.1)
Sodium: 141 mmol/L (ref 135–145)

## 2019-11-12 MED ORDER — EPOETIN ALFA-EPBX 40000 UNIT/ML IJ SOLN
40000.0000 [IU] | Freq: Once | INTRAMUSCULAR | Status: AC
  Administered 2019-11-12: 40000 [IU] via SUBCUTANEOUS
  Filled 2019-11-12: qty 1

## 2019-11-13 LAB — KAPPA/LAMBDA LIGHT CHAINS
Kappa free light chain: 25.6 mg/L — ABNORMAL HIGH (ref 3.3–19.4)
Kappa, lambda light chain ratio: 1.71 — ABNORMAL HIGH (ref 0.26–1.65)
Lambda free light chains: 15 mg/L (ref 5.7–26.3)

## 2019-11-13 LAB — PROTEIN ELECTROPHORESIS, SERUM
A/G Ratio: 1.1 (ref 0.7–1.7)
Albumin ELP: 3.6 g/dL (ref 2.9–4.4)
Alpha-1-Globulin: 0.2 g/dL (ref 0.0–0.4)
Alpha-2-Globulin: 0.7 g/dL (ref 0.4–1.0)
Beta Globulin: 0.8 g/dL (ref 0.7–1.3)
Gamma Globulin: 1.6 g/dL (ref 0.4–1.8)
Globulin, Total: 3.3 g/dL (ref 2.2–3.9)
M-Spike, %: 0.9 g/dL — ABNORMAL HIGH
Total Protein ELP: 6.9 g/dL (ref 6.0–8.5)

## 2019-11-13 LAB — IGG, IGA, IGM
IgA: 64 mg/dL (ref 64–422)
IgG (Immunoglobin G), Serum: 1652 mg/dL — ABNORMAL HIGH (ref 586–1602)
IgM (Immunoglobulin M), Srm: 120 mg/dL (ref 26–217)

## 2019-12-29 NOTE — Progress Notes (Signed)
[name]  Telephone:(336) [phone] Fax:(336) [phone]  ID: Vickie Howard OB: [birth year]  MR#: [id number]  QMG#:[id number]  Patient Care Team: Adin Hector, MD as PCP - General (Internal Medicine)  CHIEF COMPLAINT: CMML  INTERVAL HISTORY: Patient returns to clinic today for repeat laboratory work, further evaluation, and continuation of Retacrit. She complains of a poor memory, but otherwise feels well. She has chronic weakness and fatigue. She has no neurologic complaints. She denies any recent fevers or illnesses. She has a good appetite and denies weight loss. She denies any chest pain, shortness of breath, cough, or hemoptysis. She denies any nausea, vomiting, constipation, or diarrhea.  She has no melena or hematochezia.  She has no urinary complaints. Patient offers no further specific complaints today.  REVIEW OF SYSTEMS:   Review of Systems  Constitutional: Positive for malaise/fatigue. Negative for fever and weight loss.  Respiratory: Negative.  Negative for cough, hemoptysis and shortness of breath.   Cardiovascular: Negative.  Negative for chest pain and leg swelling.  Gastrointestinal: Negative.  Negative for abdominal pain, blood in stool and melena.  Genitourinary: Negative.  Negative for hematuria.  Musculoskeletal: Negative.  Negative for back pain and joint pain.  Skin: Negative.  Negative for rash.  Neurological: Positive for weakness. Negative for dizziness, sensory change, focal weakness and headaches.  Psychiatric/Behavioral: Positive for memory loss. The patient is not nervous/anxious.     As per HPI. Otherwise, a complete review of systems is negative.  PAST MEDICAL HISTORY: Past Medical History:  Diagnosis Date  . Anemia   . Arthritis   . Atrial fibrillation ([place])   . GERD (gastroesophageal reflux disease)   . Heart murmur   . History of hiatal hernia   . HLD (hyperlipidemia)   . HTN (hypertension)   . Occasional tremors     hands    PAST SURGICAL HISTORY: Past Surgical History:  Procedure Laterality Date  . JOINT REPLACEMENT     right total hip 2015  . Left total hip arthroplasty  10/12/2014  . PACEMAKER INSERTION N/A 08/12/2015   Procedure: INSERTION PACEMAKER attempt unable to obtain access ;  Surgeon: Isaias Cowman, MD;  Location: [facility] ORS;  Service: Cardiovascular;  Laterality: N/A;  . TOTAL HIP ARTHROPLASTY Left 10/12/2014   Procedure: TOTAL HIP ARTHROPLASTY;  Surgeon: Dereck Leep, MD;  Location: [facility] ORS;  Service: Orthopedics;  Laterality: Left;  . TUBAL LIGATION      FAMILY HISTORY: Family History  Problem Relation Age of Onset  . Aneurysm Father   . Breast cancer Daughter     ADVANCED DIRECTIVES (Y/N):  N  HEALTH MAINTENANCE: Social History   Tobacco Use  . Smoking status: Never Smoker  . Smokeless tobacco: Never Used  Vaping Use  . Vaping Use: Never used  Substance Use Topics  . Alcohol use: No  . Drug use: No     Colonoscopy:  PAP:  Bone density:  Lipid panel:  Allergies  Allergen Reactions  . Codeine Sulfate Other (See Comments)    Reaction:  Syncope  . Levaquin [Levofloxacin] Nausea And Vomiting  . Oxycodone Other (See Comments)    Reaction:  Disoriented     Current Outpatient Medications  Medication Sig Dispense Refill  . acetaminophen (TYLENOL) 500 MG tablet Take 1,000 mg by mouth every 6 (six) hours as needed for mild pain or headache.    Marland Kitchen aspirin EC 81 MG tablet Take 81 mg by mouth every other day.     Marland Kitchen  atorvastatin (LIPITOR) 20 MG tablet Take 20 mg by mouth daily.    Marland Kitchen azelastine (ASTELIN) 0.1 % nasal spray Place into the nose.    . Cyanocobalamin (VITAMIN B 12 PO) Take 1 mcg by mouth daily.    Marland Kitchen docusate sodium (COLACE) 100 MG capsule Take 100 mg by mouth 2 (two) times daily.    Marland Kitchen donepezil (ARICEPT) 5 MG tablet Take 5 mg by mouth daily.    . feeding supplement, ENSURE ENLIVE, (ENSURE ENLIVE) LIQD Take 237 mLs by mouth 2 (two) times daily between  meals. 237 mL 12  . ferrous sulfate 325 (65 FE) MG tablet Take 325 mg by mouth daily with breakfast.    . gabapentin (NEURONTIN) 100 MG capsule Take 100 mg by mouth daily.     . hydroxychloroquine (PLAQUENIL) 200 MG tablet Take 1 tablet by mouth 1 day or 1 dose.    Marland Kitchen LANOXIN 62.5 MCG TABS TK 1 T PO ONCE D  11  . lisinopril (PRINIVIL,ZESTRIL) 2.5 MG tablet Take 2.5 mg by mouth daily.   1  . meloxicam (MOBIC) 7.5 MG tablet Take 7.5 mg by mouth daily.    . mirtazapine (REMERON) 15 MG tablet Take 1 tablet by mouth 1 day or 1 dose.    . Multiple Vitamin (MULTI-VITAMIN) tablet Take 1 tablet by mouth 1 day or 1 dose.    . Multiple Vitamins-Minerals (PRESERVISION AREDS 2) CAPS Take 1 capsule by mouth daily.    Marland Kitchen omeprazole (PRILOSEC) 20 MG capsule Take 20 mg by mouth daily.    . sertraline (ZOLOFT) 50 MG tablet Take 75 mg by mouth daily. Patient taking '75mg'$  daily per Dr. Jens Som      No current facility-administered medications for this visit.    OBJECTIVE: Vitals:   12/31/19 1119  BP: (!) 131/55  Pulse: 68  Resp: 20  Temp: 97.9 F (36.6 C)     Body mass index is 19.69 kg/m.    ECOG FS:0 - Asymptomatic  General: Thin, no acute distress. Eyes: Pink conjunctiva, anicteric sclera. HEENT: Normocephalic, moist mucous membranes. Lungs: No audible wheezing or coughing. Heart: Regular rate and rhythm. Abdomen: Soft, nontender, no obvious distention. Musculoskeletal: No edema, cyanosis, or clubbing. Neuro: Alert, answering all questions appropriately. Cranial nerves grossly intact. Skin: No rashes or petechiae noted. Psych: Normal affect.   LAB RESULTS:  Lab Results  Component Value Date   NA 139 12/31/2019   K 3.8 12/31/2019   CL 107 12/31/2019   CO2 25 12/31/2019   GLUCOSE 111 (H) 12/31/2019   BUN 22 12/31/2019   CREATININE 0.99 12/31/2019   CALCIUM 9.2 12/31/2019   PROT 6.9 10/29/2014   ALBUMIN 3.2 (L) 10/29/2014   AST 18 10/29/2014   ALT 18 10/29/2014   ALKPHOS 85 10/29/2014     BILITOT 0.2 (L) 10/29/2014   GFRNONAA 53 (L) 12/31/2019   GFRAA >60 12/31/2019    Lab Results  Component Value Date   WBC 12.7 (H) 12/31/2019   NEUTROABS 6.9 12/31/2019   HGB 9.5 (L) 12/31/2019   HCT 27.9 (L) 12/31/2019   MCV 104.1 (H) 12/31/2019   PLT 107 (L) 12/31/2019     STUDIES: No results found.  ASSESSMENT: CMML.  PLAN:   1. CMML: Confirmed on bone marrow biopsy from June 18, 2018.  Patient noted to have a hypercellular marrow with mild dysplasia in all 3 lineages.  No increased blasts. Patient noted to have an increased white blood cell count of 12.7 as well as  an increase in her absolute monocytosis of 4.8. Given her advanced age and she is asymptomatic, she does not require treatment at this time.  Can consider Vidaza or Dacogen in the future if absolutely necessary.  Continue supportive care with Retacrit every 6 weeks.   2.  Anemia: Possibly secondary to CMML. Hemoglobin 9.7 today. Proceed with 40,000 units Retacrit today.  Return to clinic in 6 weeks for laboratory work and Retacrit if her hemoglobin remains below 10.0. Patient then return to clinic in 3 months for further evaluation and continuation of treatment if needed. 3.  Elevated MCV: B12 and folate are within normal limits.  Secondary to CMML.   3.  Thrombocytopenia: Chronic and unchanged. Platelet count 107 today. 4.  Leukocytosis: White blood cell count and absolute monocyte count as above.  I spent a total of 30 minutes reviewing chart data, face-to-face evaluation with the patient, counseling and coordination of care as detailed above.   Patient expressed understanding and was in agreement with this plan. She also understands that She can call clinic at any time with any questions, concerns, or complaints.    Lloyd Huger, MD   01/03/2020 7:15 AM

## 2019-12-31 ENCOUNTER — Inpatient Hospital Stay (HOSPITAL_BASED_OUTPATIENT_CLINIC_OR_DEPARTMENT_OTHER): Payer: Medicare Other | Admitting: Oncology

## 2019-12-31 ENCOUNTER — Other Ambulatory Visit: Payer: Self-pay

## 2019-12-31 ENCOUNTER — Inpatient Hospital Stay: Payer: Medicare Other | Attending: Oncology

## 2019-12-31 ENCOUNTER — Encounter: Payer: Self-pay | Admitting: Oncology

## 2019-12-31 ENCOUNTER — Inpatient Hospital Stay: Payer: Medicare Other

## 2019-12-31 VITALS — BP 131/55 | HR 68 | Temp 97.9°F | Resp 20 | Wt 97.5 lb

## 2019-12-31 DIAGNOSIS — C9310 Chronic myelomonocytic leukemia not having achieved remission: Secondary | ICD-10-CM

## 2019-12-31 DIAGNOSIS — D630 Anemia in neoplastic disease: Secondary | ICD-10-CM | POA: Insufficient documentation

## 2019-12-31 LAB — BASIC METABOLIC PANEL
Anion gap: 7 (ref 5–15)
BUN: 22 mg/dL (ref 8–23)
CO2: 25 mmol/L (ref 22–32)
Calcium: 9.2 mg/dL (ref 8.9–10.3)
Chloride: 107 mmol/L (ref 98–111)
Creatinine, Ser: 0.99 mg/dL (ref 0.44–1.00)
GFR calc Af Amer: >60 mL/min (ref >60)
GFR calc non Af Amer: 53 mL/min — ABNORMAL LOW (ref >60)
Glucose, Bld: 111 mg/dL — ABNORMAL HIGH (ref 70–99)
Potassium: 3.8 mmol/L (ref 3.5–5.1)
Sodium: 139 mmol/L (ref 135–145)

## 2019-12-31 LAB — CBC WITH DIFFERENTIAL/PLATELET
Abs Immature Granulocytes: 0 10*3/uL (ref 0.00–0.07)
Band Neutrophils: 1 %
Basophils Absolute: 0 10*3/uL (ref 0.0–0.1)
Basophils Relative: 0 %
Eosinophils Absolute: 0 10*3/uL (ref 0.0–0.5)
Eosinophils Relative: 0 %
HCT: 27.9 % — ABNORMAL LOW (ref 36.0–46.0)
Hemoglobin: 9.5 g/dL — ABNORMAL LOW (ref 12.0–15.0)
Lymphocytes Relative: 8 %
Lymphs Abs: 1 10*3/uL (ref 0.7–4.0)
MCH: 35.4 pg — ABNORMAL HIGH (ref 26.0–34.0)
MCHC: 34.1 g/dL (ref 30.0–36.0)
MCV: 104.1 fL — ABNORMAL HIGH (ref 80.0–100.0)
Monocytes Absolute: 4.8 10*3/uL — ABNORMAL HIGH (ref 0.1–1.0)
Monocytes Relative: 38 %
Neutro Abs: 6.9 10*3/uL (ref 1.7–7.7)
Neutrophils Relative %: 53 %
Platelets: 107 10*3/uL — ABNORMAL LOW (ref 150–400)
RBC: 2.68 MIL/uL — ABNORMAL LOW (ref 3.87–5.11)
RDW: 15 % (ref 11.5–15.5)
Smear Review: DECREASED
WBC: 12.7 10*3/uL — ABNORMAL HIGH (ref 4.0–10.5)
nRBC: 0 % (ref 0.0–0.2)

## 2019-12-31 MED ORDER — EPOETIN ALFA-EPBX 40000 UNIT/ML IJ SOLN
40000.0000 [IU] | Freq: Once | INTRAMUSCULAR | Status: AC
  Administered 2019-12-31: 40000 [IU] via SUBCUTANEOUS
  Filled 2019-12-31: qty 1

## 2019-12-31 NOTE — Progress Notes (Signed)
Patient here today for follow up regarding anemia. Patient reports

## 2020-01-01 LAB — IGG, IGA, IGM
IgA: 79 mg/dL (ref 64–422)
IgG (Immunoglobin G), Serum: 1553 mg/dL (ref 586–1602)
IgM (Immunoglobulin M), Srm: 125 mg/dL (ref 26–217)

## 2020-01-01 LAB — PROTEIN ELECTROPHORESIS, SERUM
A/G Ratio: 1.1 (ref 0.7–1.7)
Albumin ELP: 3.7 g/dL (ref 2.9–4.4)
Alpha-1-Globulin: 0.3 g/dL (ref 0.0–0.4)
Alpha-2-Globulin: 0.9 g/dL (ref 0.4–1.0)
Beta Globulin: 0.8 g/dL (ref 0.7–1.3)
Gamma Globulin: 1.5 g/dL (ref 0.4–1.8)
Globulin, Total: 3.4 g/dL (ref 2.2–3.9)
M-Spike, %: 1.2 g/dL — ABNORMAL HIGH
Total Protein ELP: 7.1 g/dL (ref 6.0–8.5)

## 2020-01-01 LAB — KAPPA/LAMBDA LIGHT CHAINS
Kappa free light chain: 32.6 mg/L — ABNORMAL HIGH (ref 3.3–19.4)
Kappa, lambda light chain ratio: 1.64 (ref 0.26–1.65)
Lambda free light chains: 19.9 mg/L (ref 5.7–26.3)

## 2020-02-11 ENCOUNTER — Inpatient Hospital Stay: Payer: Medicare Other

## 2020-02-25 ENCOUNTER — Inpatient Hospital Stay: Payer: Medicare Other | Attending: Oncology

## 2020-02-25 ENCOUNTER — Inpatient Hospital Stay: Payer: Medicare Other

## 2020-02-25 ENCOUNTER — Other Ambulatory Visit: Payer: Self-pay

## 2020-02-25 VITALS — BP 160/73 | HR 67

## 2020-02-25 DIAGNOSIS — D649 Anemia, unspecified: Secondary | ICD-10-CM

## 2020-02-25 DIAGNOSIS — D630 Anemia in neoplastic disease: Secondary | ICD-10-CM | POA: Diagnosis not present

## 2020-02-25 DIAGNOSIS — C9310 Chronic myelomonocytic leukemia not having achieved remission: Secondary | ICD-10-CM

## 2020-02-25 LAB — BASIC METABOLIC PANEL
Anion gap: 10 (ref 5–15)
BUN: 20 mg/dL (ref 8–23)
CO2: 25 mmol/L (ref 22–32)
Calcium: 9 mg/dL (ref 8.9–10.3)
Chloride: 106 mmol/L (ref 98–111)
Creatinine, Ser: 0.54 mg/dL (ref 0.44–1.00)
GFR calc Af Amer: >60 mL/min (ref >60)
GFR calc non Af Amer: >60 mL/min (ref >60)
Glucose, Bld: 93 mg/dL (ref 70–99)
Potassium: 3.6 mmol/L (ref 3.5–5.1)
Sodium: 141 mmol/L (ref 135–145)

## 2020-02-25 LAB — CBC WITH DIFFERENTIAL/PLATELET
Abs Immature Granulocytes: 0.03 10*3/uL (ref 0.00–0.07)
Basophils Absolute: 0 10*3/uL (ref 0.0–0.1)
Basophils Relative: 0 %
Eosinophils Absolute: 0 10*3/uL (ref 0.0–0.5)
Eosinophils Relative: 0 %
HCT: 26.2 % — ABNORMAL LOW (ref 36.0–46.0)
Hemoglobin: 8.7 g/dL — ABNORMAL LOW (ref 12.0–15.0)
Immature Granulocytes: 0 %
Lymphocytes Relative: 14 %
Lymphs Abs: 1 10*3/uL (ref 0.7–4.0)
MCH: 35.4 pg — ABNORMAL HIGH (ref 26.0–34.0)
MCHC: 33.2 g/dL (ref 30.0–36.0)
MCV: 106.5 fL — ABNORMAL HIGH (ref 80.0–100.0)
Monocytes Absolute: 1.2 10*3/uL — ABNORMAL HIGH (ref 0.1–1.0)
Monocytes Relative: 16 %
Neutro Abs: 4.9 10*3/uL (ref 1.7–7.7)
Neutrophils Relative %: 70 %
Platelets: 77 10*3/uL — ABNORMAL LOW (ref 150–400)
RBC: 2.46 MIL/uL — ABNORMAL LOW (ref 3.87–5.11)
RDW: 15.9 % — ABNORMAL HIGH (ref 11.5–15.5)
WBC: 7 10*3/uL (ref 4.0–10.5)
nRBC: 0 % (ref 0.0–0.2)

## 2020-02-25 MED ORDER — EPOETIN ALFA-EPBX 40000 UNIT/ML IJ SOLN: 40000.0000 [IU] | Freq: Once | INTRAMUSCULAR | Status: DC

## 2020-02-25 MED ORDER — EPOETIN ALFA-EPBX 40000 UNIT/ML IJ SOLN
40000.0000 [IU] | Freq: Once | INTRAMUSCULAR | Status: AC
  Administered 2020-02-25: 40000 [IU] via SUBCUTANEOUS
  Filled 2020-02-25: qty 1

## 2020-02-26 LAB — PROTEIN ELECTROPHORESIS, SERUM
A/G Ratio: 1.2 (ref 0.7–1.7)
Albumin ELP: 3.9 g/dL (ref 2.9–4.4)
Alpha-1-Globulin: 0.2 g/dL (ref 0.0–0.4)
Alpha-2-Globulin: 0.7 g/dL (ref 0.4–1.0)
Beta Globulin: 0.8 g/dL (ref 0.7–1.3)
Gamma Globulin: 1.6 g/dL (ref 0.4–1.8)
Globulin, Total: 3.3 g/dL (ref 2.2–3.9)
M-Spike, %: 1.2 g/dL — ABNORMAL HIGH
Total Protein ELP: 7.2 g/dL (ref 6.0–8.5)

## 2020-02-26 LAB — KAPPA/LAMBDA LIGHT CHAINS
Kappa free light chain: 24.1 mg/L — ABNORMAL HIGH (ref 3.3–19.4)
Kappa, lambda light chain ratio: 1.54 (ref 0.26–1.65)
Lambda free light chains: 15.6 mg/L (ref 5.7–26.3)

## 2020-02-26 LAB — IGG, IGA, IGM
IgA: 90 mg/dL (ref 64–422)
IgG (Immunoglobin G), Serum: 1655 mg/dL — ABNORMAL HIGH (ref 586–1602)
IgM (Immunoglobulin M), Srm: 131 mg/dL (ref 26–217)

## 2020-04-01 ENCOUNTER — Ambulatory Visit: Payer: Medicare Other | Admitting: Oncology

## 2020-04-01 ENCOUNTER — Ambulatory Visit: Payer: Medicare Other

## 2020-04-01 ENCOUNTER — Other Ambulatory Visit: Payer: Medicare Other

## 2020-04-18 NOTE — Progress Notes (Signed)
[place]  Telephone:(336) [phone] Fax:(336) [phone]  ID: Vickie Howard OB: 06-09-1935  MR#: [id number]  ITG#:[id number]  Patient Care Team: Adin Hector, MD as PCP - General (Internal Medicine)  CHIEF COMPLAINT: CMML  INTERVAL HISTORY: Patient returns to clinic today for repeat laboratory work, further evaluation, and continuation of Retacrit.  She had a ground-level fall recently and obtained some bruising, but did not seek medical attention at that time.  She continues to have chronic weakness and fatigue and a poor memory, but otherwise feels well. She has no neurologic complaints. She denies any recent fevers or illnesses. She has a good appetite and denies weight loss. She denies any chest pain, shortness of breath, cough, or hemoptysis. She denies any nausea, vomiting, constipation, or diarrhea.  She has no melena or hematochezia.  She has no urinary complaints.  Patient offers no further specific complaints today.  REVIEW OF SYSTEMS:   Review of Systems  Constitutional: Positive for malaise/fatigue. Negative for fever and weight loss.  Respiratory: Negative.  Negative for cough, hemoptysis and shortness of breath.   Cardiovascular: Negative.  Negative for chest pain and leg swelling.  Gastrointestinal: Negative.  Negative for abdominal pain, blood in stool and melena.  Genitourinary: Negative.  Negative for hematuria.  Musculoskeletal: Positive for falls. Negative for back pain and joint pain.  Skin: Negative.  Negative for rash.  Neurological: Positive for weakness. Negative for dizziness, sensory change, focal weakness and headaches.  Psychiatric/Behavioral: Positive for memory loss. The patient is not nervous/anxious.     As per HPI. Otherwise, a complete review of systems is negative.  PAST MEDICAL HISTORY: Past Medical History:  Diagnosis Date  . Anemia   . Arthritis   . Atrial fibrillation ([place])   . GERD (gastroesophageal reflux disease)    . Heart murmur   . History of hiatal hernia   . HLD (hyperlipidemia)   . HTN (hypertension)   . Occasional tremors    hands    PAST SURGICAL HISTORY: Past Surgical History:  Procedure Laterality Date  . JOINT REPLACEMENT     right total hip 2015  . Left total hip arthroplasty  10/12/2014  . PACEMAKER INSERTION N/A 08/12/2015   Procedure: INSERTION PACEMAKER attempt unable to obtain access ;  Surgeon: Isaias Cowman, MD;  Location: [facility] ORS;  Service: Cardiovascular;  Laterality: N/A;  . TOTAL HIP ARTHROPLASTY Left 10/12/2014   Procedure: TOTAL HIP ARTHROPLASTY;  Surgeon: Dereck Leep, MD;  Location: [facility] ORS;  Service: Orthopedics;  Laterality: Left;  . TUBAL LIGATION      FAMILY HISTORY: Family History  Problem Relation Age of Onset  . Aneurysm Father   . Breast cancer Daughter     ADVANCED DIRECTIVES (Y/N):  N  HEALTH MAINTENANCE: Social History   Tobacco Use  . Smoking status: Never Smoker  . Smokeless tobacco: Never Used  Vaping Use  . Vaping Use: Never used  Substance Use Topics  . Alcohol use: No  . Drug use: No     Colonoscopy:  PAP:  Bone density:  Lipid panel:  Allergies  Allergen Reactions  . Codeine Sulfate Other (See Comments)    Reaction:  Syncope  . Levaquin [Levofloxacin] Nausea And Vomiting  . Oxycodone Other (See Comments)    Reaction:  Disoriented     Current Outpatient Medications  Medication Sig Dispense Refill  . aspirin EC 81 MG tablet Take 81 mg by mouth every other day.     Marland Kitchen atorvastatin (  LIPITOR) 20 MG tablet Take 20 mg by mouth daily.    . Cyanocobalamin (VITAMIN B 12 PO) Take 1 mcg by mouth daily.    Marland Kitchen docusate sodium (COLACE) 100 MG capsule Take 100 mg by mouth 2 (two) times daily.    Marland Kitchen donepezil (ARICEPT) 5 MG tablet Take 5 mg by mouth daily.    . feeding supplement, ENSURE ENLIVE, (ENSURE ENLIVE) LIQD Take 237 mLs by mouth 2 (two) times daily between meals. 237 mL 12  . ferrous sulfate 325 (65 FE) MG tablet Take 325  mg by mouth daily with breakfast.    . LANOXIN 62.5 MCG TABS TK 1 T PO ONCE D  11  . lisinopril (PRINIVIL,ZESTRIL) 2.5 MG tablet Take 2.5 mg by mouth daily.   1  . mirtazapine (REMERON) 15 MG tablet Take 1 tablet by mouth 1 day or 1 dose.    . Multiple Vitamin (MULTI-VITAMIN) tablet Take 1 tablet by mouth 1 day or 1 dose.    . Multiple Vitamins-Minerals (PRESERVISION AREDS 2) CAPS Take 1 capsule by mouth daily.    Marland Kitchen omeprazole (PRILOSEC) 20 MG capsule Take 20 mg by mouth daily.    . sertraline (ZOLOFT) 50 MG tablet Take 75 mg by mouth daily. Patient taking 55m daily per Dr. KJens Som    . acetaminophen (TYLENOL) 500 MG tablet Take 1,000 mg by mouth every 6 (six) hours as needed for mild pain or headache.     No current facility-administered medications for this visit.    OBJECTIVE: Vitals:   04/23/20 1011  BP: (!) 164/74  Pulse: 71  Temp: 98.7 F (37.1 C)  SpO2: 98%     Body mass index is 19.85 kg/m.    ECOG FS:0 - Asymptomatic  General: Thin, no acute distress. Eyes: Pink conjunctiva, anicteric sclera. HEENT: Normocephalic, moist mucous membranes. Lungs: No audible wheezing or coughing. Heart: Regular rate and rhythm. Abdomen: Soft, nontender, no obvious distention. Musculoskeletal: No edema, cyanosis, or clubbing. Neuro: Alert, answering all questions appropriately. Cranial nerves grossly intact. Skin: No rashes or petechiae noted. Psych: Normal affect.    LAB RESULTS:  Lab Results  Component Value Date   NA 141 04/23/2020   K 3.8 04/23/2020   CL 106 04/23/2020   CO2 25 04/23/2020   GLUCOSE 106 (H) 04/23/2020   BUN 20 04/23/2020   CREATININE 0.66 04/23/2020   CALCIUM 9.4 04/23/2020   PROT 6.9 10/29/2014   ALBUMIN 3.2 (L) 10/29/2014   AST 18 10/29/2014   ALT 18 10/29/2014   ALKPHOS 85 10/29/2014   BILITOT 0.2 (L) 10/29/2014   GFRNONAA >60 04/23/2020   GFRAA >60 02/25/2020    Lab Results  Component Value Date   WBC 12.0 (H) 04/23/2020   NEUTROABS 8.4 (H)  04/23/2020   HGB 10.1 (L) 04/23/2020   HCT 30.4 (L) 04/23/2020   MCV 108.2 (H) 04/23/2020   PLT 110 (L) 04/23/2020     STUDIES: No results found.  ASSESSMENT: CMML.  PLAN:   1. CMML: Confirmed on bone marrow biopsy from June 18, 2018.  Patient noted to have a hypercellular marrow with mild dysplasia in all 3 lineages.  No increased blasts.  Her white blood cell count remains persistently elevated at 12.0 with an absolute monocytosis 2.2.  Given her advanced age and she is asymptomatic, she does not require treatment at this time.  Can consider Vidaza or Dacogen in the future if absolutely necessary, but patient has indicated she would likely decline treatment.  Continue supportive care with Retacrit every 6 weeks as needed. 2.  Anemia: Possibly secondary to CMML.  Hemoglobin is 10.1 today, therefore she does not require treatment with Retacrit. Return to clinic in 6 weeks for laboratory work and Retacrit if her hemoglobin remains below 10.0. Patient then return to clinic in 3 months for further evaluation and continuation of treatment if needed. 3.  Elevated MCV: B12 and folate are within normal limits.  Secondary to CMML.   3.  Thrombocytopenia: Chronic and unchanged.  Patient's platelet count is 110 today. 4.  Leukocytosis: Chronic and unchanged.    Patient expressed understanding and was in agreement with this plan. She also understands that She can call clinic at any time with any questions, concerns, or complaints.    Lloyd Huger, MD   04/24/2020 3:11 PM

## 2020-04-23 ENCOUNTER — Inpatient Hospital Stay: Payer: Medicare Other | Attending: Oncology | Admitting: Oncology

## 2020-04-23 ENCOUNTER — Encounter: Payer: Self-pay | Admitting: Oncology

## 2020-04-23 ENCOUNTER — Inpatient Hospital Stay: Payer: Medicare Other

## 2020-04-23 ENCOUNTER — Other Ambulatory Visit: Payer: Self-pay

## 2020-04-23 VITALS — BP 164/74 | HR 71 | Temp 98.7°F | Wt 98.3 lb

## 2020-04-23 DIAGNOSIS — D649 Anemia, unspecified: Secondary | ICD-10-CM | POA: Diagnosis not present

## 2020-04-23 DIAGNOSIS — C9310 Chronic myelomonocytic leukemia not having achieved remission: Secondary | ICD-10-CM | POA: Insufficient documentation

## 2020-04-23 DIAGNOSIS — D696 Thrombocytopenia, unspecified: Secondary | ICD-10-CM | POA: Insufficient documentation

## 2020-04-23 LAB — CBC WITH DIFFERENTIAL/PLATELET
Abs Immature Granulocytes: 0.06 10*3/uL (ref 0.00–0.07)
Basophils Absolute: 0 10*3/uL (ref 0.0–0.1)
Basophils Relative: 0 %
Eosinophils Absolute: 0 10*3/uL (ref 0.0–0.5)
Eosinophils Relative: 0 %
HCT: 30.4 % — ABNORMAL LOW (ref 36.0–46.0)
Hemoglobin: 10.1 g/dL — ABNORMAL LOW (ref 12.0–15.0)
Immature Granulocytes: 1 %
Lymphocytes Relative: 11 %
Lymphs Abs: 1.3 10*3/uL (ref 0.7–4.0)
MCH: 35.9 pg — ABNORMAL HIGH (ref 26.0–34.0)
MCHC: 33.2 g/dL (ref 30.0–36.0)
MCV: 108.2 fL — ABNORMAL HIGH (ref 80.0–100.0)
Monocytes Absolute: 2.2 10*3/uL — ABNORMAL HIGH (ref 0.1–1.0)
Monocytes Relative: 19 %
Neutro Abs: 8.4 10*3/uL — ABNORMAL HIGH (ref 1.7–7.7)
Neutrophils Relative %: 69 %
Platelets: 110 10*3/uL — ABNORMAL LOW (ref 150–400)
RBC: 2.81 MIL/uL — ABNORMAL LOW (ref 3.87–5.11)
RDW: 15.4 % (ref 11.5–15.5)
Smear Review: DECREASED
WBC: 12 10*3/uL — ABNORMAL HIGH (ref 4.0–10.5)
nRBC: 0 % (ref 0.0–0.2)

## 2020-04-23 LAB — BASIC METABOLIC PANEL
Anion gap: 10 (ref 5–15)
BUN: 20 mg/dL (ref 8–23)
CO2: 25 mmol/L (ref 22–32)
Calcium: 9.4 mg/dL (ref 8.9–10.3)
Chloride: 106 mmol/L (ref 98–111)
Creatinine, Ser: 0.66 mg/dL (ref 0.44–1.00)
GFR, Estimated: >60 mL/min (ref >60)
Glucose, Bld: 106 mg/dL — ABNORMAL HIGH (ref 70–99)
Potassium: 3.8 mmol/L (ref 3.5–5.1)
Sodium: 141 mmol/L (ref 135–145)

## 2020-04-23 NOTE — Progress Notes (Signed)
Patient lost her balance and fell on 04/11/2020. She refused medical help from ENT.

## 2020-04-24 LAB — IGG, IGA, IGM
IgA: 72 mg/dL (ref 64–422)
IgG (Immunoglobin G), Serum: 1595 mg/dL (ref 586–1602)
IgM (Immunoglobulin M), Srm: 140 mg/dL (ref 26–217)

## 2020-04-26 LAB — PROTEIN ELECTROPHORESIS, SERUM
A/G Ratio: 1.2 (ref 0.7–1.7)
Albumin ELP: 3.9 g/dL (ref 2.9–4.4)
Alpha-1-Globulin: 0.3 g/dL (ref 0.0–0.4)
Alpha-2-Globulin: 0.8 g/dL (ref 0.4–1.0)
Beta Globulin: 0.9 g/dL (ref 0.7–1.3)
Gamma Globulin: 1.4 g/dL (ref 0.4–1.8)
Globulin, Total: 3.3 g/dL (ref 2.2–3.9)
M-Spike, %: 1 g/dL — ABNORMAL HIGH
Total Protein ELP: 7.2 g/dL (ref 6.0–8.5)

## 2020-04-26 LAB — KAPPA/LAMBDA LIGHT CHAINS
Kappa free light chain: 26.7 mg/L — ABNORMAL HIGH (ref 3.3–19.4)
Kappa, lambda light chain ratio: 1.8 — ABNORMAL HIGH (ref 0.26–1.65)
Lambda free light chains: 14.8 mg/L (ref 5.7–26.3)

## 2020-05-12 IMAGING — CT CT HEAD W/O CM
2 of 4 series · 13 of 47 positions shown, 16 images · non-contrast
Comparison: MRI of the brain October 11, 2009

CLINICAL DATA: Memory loss.

EXAM:
CT HEAD WITHOUT CONTRAST
TECHNIQUE: Contiguous axial images were obtained from the base of the skull
through the vertex without intravenous contrast.

[Series 2: axial st head 5.00 ax · axial · 0.30mm/px · z∈[-623,-519]mm · 10 of 26 slices shown, 13 images]
[im 2/26  brain]
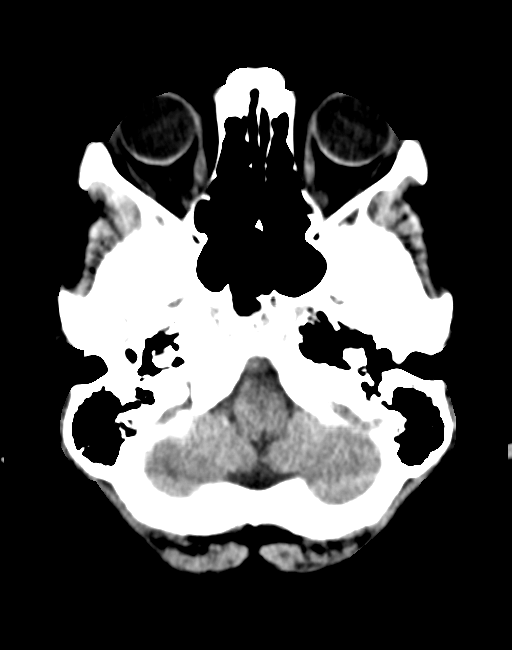
[im 2/26  bone]
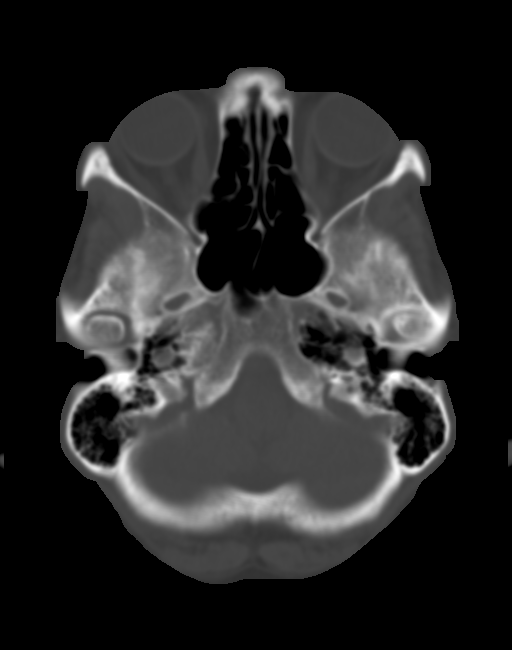
[im 4/26  brain]
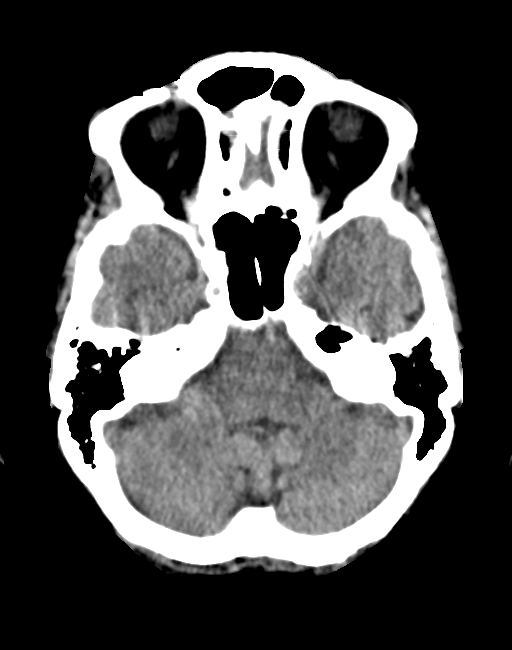
[im 8/26  brain]
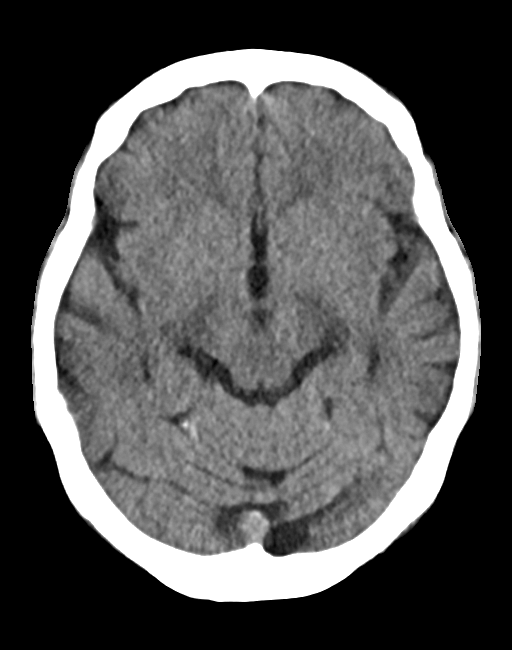
[im 9/26  brain]
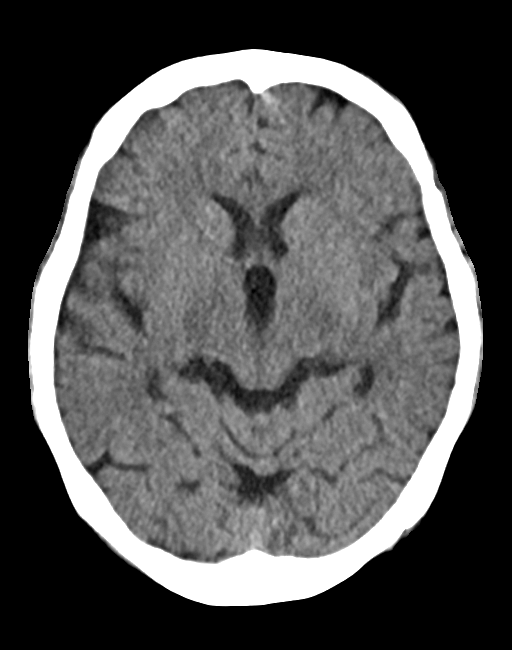
[im 11/26  brain]
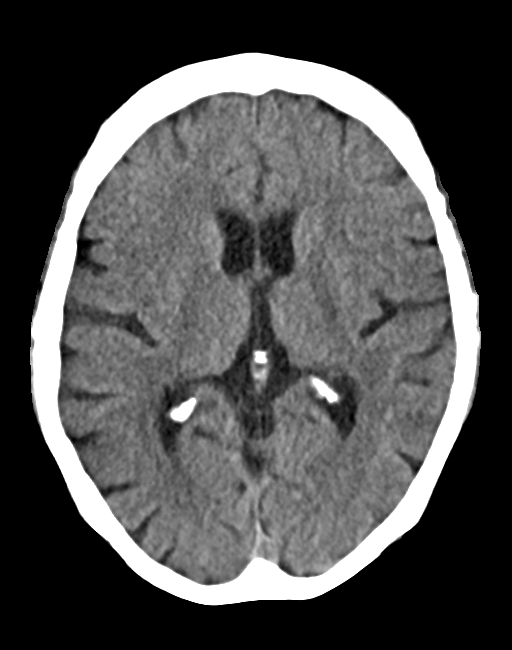
[im 11/26  bone]
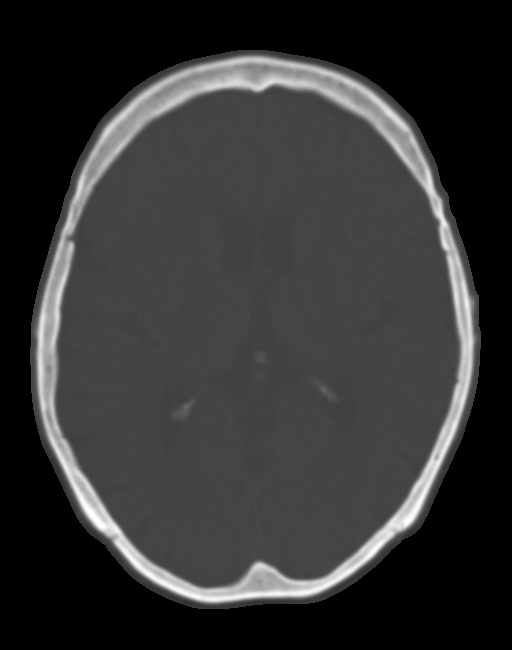
[im 15/26  brain]
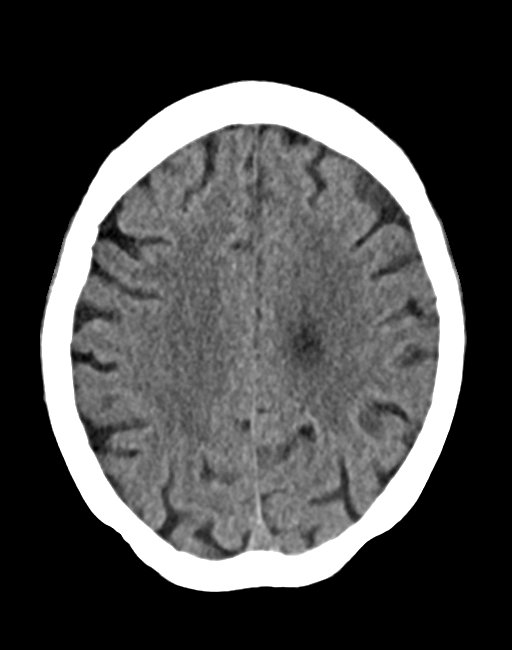
[im 17/26  brain]
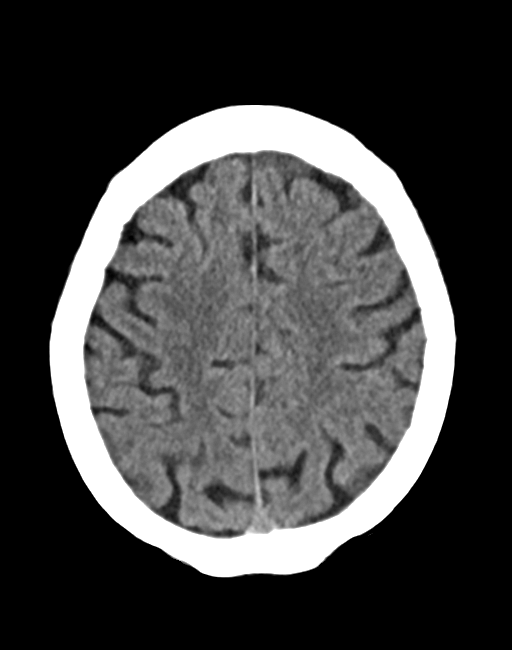
[im 18/26  brain]
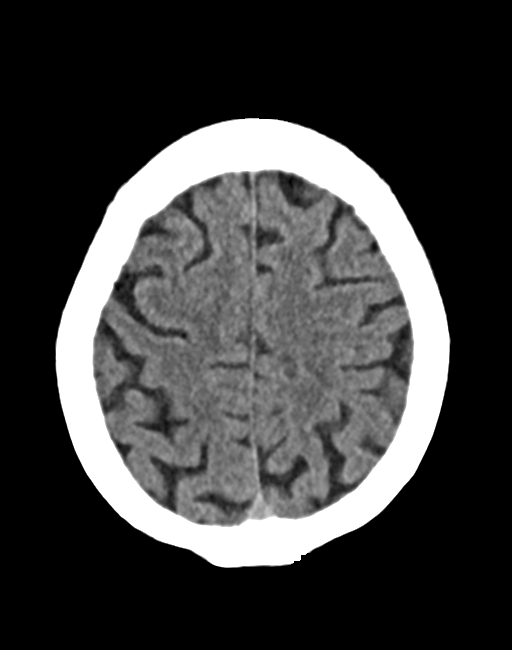
[im 22/26  brain]
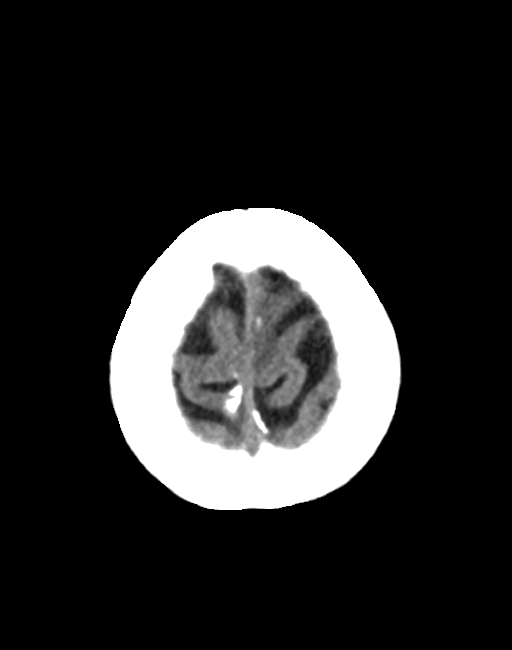
[im 22/26  bone]
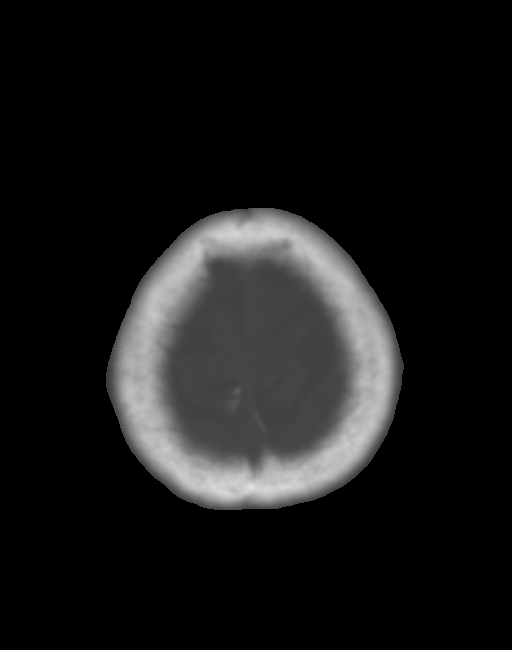
[im 24/26  brain]
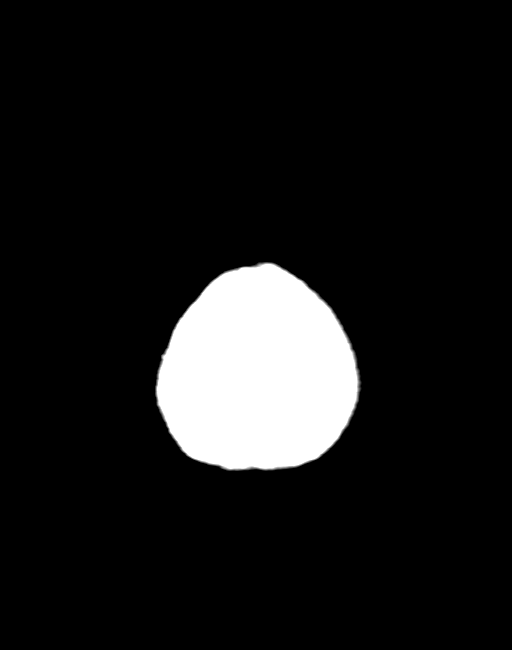

[Series 6: coronals head 3.00 cor · coronal · 0.25mm/px · 3 of 65 slices shown]
[im 22/65  brain]
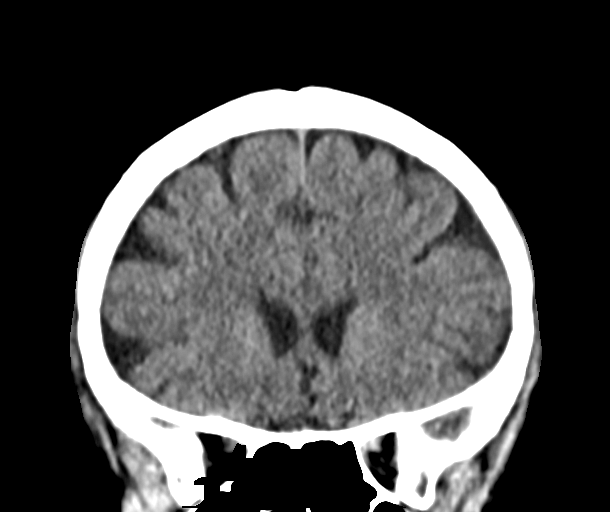
[im 29/65  brain]
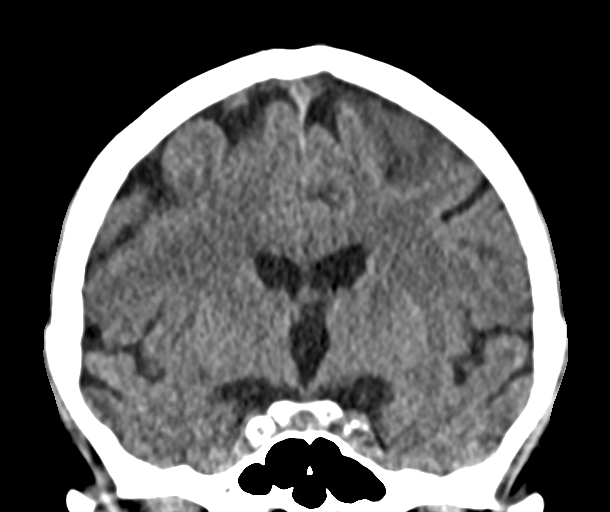
[im 36/65  brain]
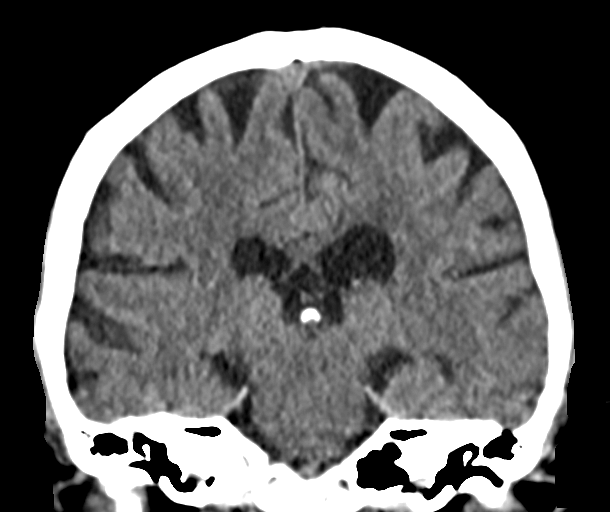

[13 of 47 positions shown; findings below may reference images not displayed]

FINDINGS: Brain: No evidence of acute infarction, hemorrhage, hydrocephalus,
extra-axial collection or mass lesion/mass effect.

Remote lacunar infarct in the periventricular white matter adjacent
to the body of the left lateral ventricle. Mild prominence of the
cerebral sulci reflecting mild age-appropriate parenchymal volume
loss.

Vascular: Calcified plaques in the bilateral carotid siphons

Skull: Normal. Negative for fracture or focal lesion.

Sinuses/Orbits: No acute finding.

Other: None
IMPRESSION: 1. No acute intracranial process.
2. Remote lacunar infarct in the periventricular white matter
adjacent to the body of the left lateral ventricle.

## 2020-06-03 ENCOUNTER — Inpatient Hospital Stay: Payer: Medicare Other | Attending: Oncology

## 2020-06-03 ENCOUNTER — Inpatient Hospital Stay: Payer: Medicare Other

## 2020-06-03 VITALS — BP 160/75 | HR 71

## 2020-06-03 DIAGNOSIS — C9310 Chronic myelomonocytic leukemia not having achieved remission: Secondary | ICD-10-CM | POA: Diagnosis not present

## 2020-06-03 LAB — CBC WITH DIFFERENTIAL/PLATELET
Abs Immature Granulocytes: 0.04 10*3/uL (ref 0.00–0.07)
Basophils Absolute: 0.1 10*3/uL (ref 0.0–0.1)
Basophils Relative: 1 %
Eosinophils Absolute: 0 10*3/uL (ref 0.0–0.5)
Eosinophils Relative: 0 %
HCT: 29 % — ABNORMAL LOW (ref 36.0–46.0)
Hemoglobin: 9.5 g/dL — ABNORMAL LOW (ref 12.0–15.0)
Immature Granulocytes: 1 %
Lymphocytes Relative: 16 %
Lymphs Abs: 1.3 10*3/uL (ref 0.7–4.0)
MCH: 35.1 pg — ABNORMAL HIGH (ref 26.0–34.0)
MCHC: 32.8 g/dL (ref 30.0–36.0)
MCV: 107 fL — ABNORMAL HIGH (ref 80.0–100.0)
Monocytes Absolute: 1.7 10*3/uL — ABNORMAL HIGH (ref 0.1–1.0)
Monocytes Relative: 21 %
Neutro Abs: 5.2 10*3/uL (ref 1.7–7.7)
Neutrophils Relative %: 61 %
Platelets: 103 10*3/uL — ABNORMAL LOW (ref 150–400)
RBC: 2.71 MIL/uL — ABNORMAL LOW (ref 3.87–5.11)
RDW: 14.6 % (ref 11.5–15.5)
WBC: 8.3 10*3/uL (ref 4.0–10.5)
nRBC: 0 % (ref 0.0–0.2)

## 2020-06-03 LAB — BASIC METABOLIC PANEL
Anion gap: 7 (ref 5–15)
BUN: 22 mg/dL (ref 8–23)
CO2: 26 mmol/L (ref 22–32)
Calcium: 9.3 mg/dL (ref 8.9–10.3)
Chloride: 105 mmol/L (ref 98–111)
Creatinine, Ser: 0.69 mg/dL (ref 0.44–1.00)
GFR, Estimated: >60 mL/min (ref >60)
Glucose, Bld: 94 mg/dL (ref 70–99)
Potassium: 3.9 mmol/L (ref 3.5–5.1)
Sodium: 138 mmol/L (ref 135–145)

## 2020-06-03 MED ORDER — EPOETIN ALFA-EPBX 40000 UNIT/ML IJ SOLN
40000.0000 [IU] | Freq: Once | INTRAMUSCULAR | Status: AC
  Administered 2020-06-03: 12:00:00 40000 [IU] via SUBCUTANEOUS
  Filled 2020-06-03: qty 1

## 2020-06-04 LAB — IGG, IGA, IGM
IgA: 75 mg/dL (ref 64–422)
IgG (Immunoglobin G), Serum: 1489 mg/dL (ref 586–1602)
IgM (Immunoglobulin M), Srm: 135 mg/dL (ref 26–217)

## 2020-06-07 LAB — PROTEIN ELECTROPHORESIS, SERUM
A/G Ratio: 1.2 (ref 0.7–1.7)
Albumin ELP: 3.8 g/dL (ref 2.9–4.4)
Alpha-1-Globulin: 0.3 g/dL (ref 0.0–0.4)
Alpha-2-Globulin: 0.7 g/dL (ref 0.4–1.0)
Beta Globulin: 0.8 g/dL (ref 0.7–1.3)
Gamma Globulin: 1.4 g/dL (ref 0.4–1.8)
Globulin, Total: 3.2 g/dL (ref 2.2–3.9)
M-Spike, %: 1.1 g/dL — ABNORMAL HIGH
Total Protein ELP: 7 g/dL (ref 6.0–8.5)

## 2020-06-07 LAB — KAPPA/LAMBDA LIGHT CHAINS
Kappa free light chain: 21.4 mg/L — ABNORMAL HIGH (ref 3.3–19.4)
Kappa, lambda light chain ratio: 1.53 (ref 0.26–1.65)
Lambda free light chains: 14 mg/L (ref 5.7–26.3)

## 2020-07-23 ENCOUNTER — Inpatient Hospital Stay: Payer: 59 | Attending: Oncology

## 2020-07-23 ENCOUNTER — Inpatient Hospital Stay (HOSPITAL_BASED_OUTPATIENT_CLINIC_OR_DEPARTMENT_OTHER): Payer: 59 | Admitting: Oncology

## 2020-07-23 ENCOUNTER — Inpatient Hospital Stay: Payer: 59

## 2020-07-23 VITALS — BP 152/74 | HR 74 | Temp 98.1°F | Resp 16 | Wt 97.5 lb

## 2020-07-23 DIAGNOSIS — C9310 Chronic myelomonocytic leukemia not having achieved remission: Secondary | ICD-10-CM

## 2020-07-23 DIAGNOSIS — D630 Anemia in neoplastic disease: Secondary | ICD-10-CM | POA: Insufficient documentation

## 2020-07-23 DIAGNOSIS — D696 Thrombocytopenia, unspecified: Secondary | ICD-10-CM | POA: Diagnosis not present

## 2020-07-23 LAB — BASIC METABOLIC PANEL
Anion gap: 11 (ref 5–15)
BUN: 19 mg/dL (ref 8–23)
CO2: 23 mmol/L (ref 22–32)
Calcium: 9.1 mg/dL (ref 8.9–10.3)
Chloride: 105 mmol/L (ref 98–111)
Creatinine, Ser: 0.8 mg/dL (ref 0.44–1.00)
GFR, Estimated: >60 mL/min (ref >60)
Glucose, Bld: 98 mg/dL (ref 70–99)
Potassium: 3.8 mmol/L (ref 3.5–5.1)
Sodium: 139 mmol/L (ref 135–145)

## 2020-07-23 LAB — CBC WITH DIFFERENTIAL/PLATELET
Abs Immature Granulocytes: 0.06 10*3/uL (ref 0.00–0.07)
Basophils Absolute: 0 10*3/uL (ref 0.0–0.1)
Basophils Relative: 0 %
Eosinophils Absolute: 0 10*3/uL (ref 0.0–0.5)
Eosinophils Relative: 0 %
HCT: 30.4 % — ABNORMAL LOW (ref 36.0–46.0)
Hemoglobin: 9.9 g/dL — ABNORMAL LOW (ref 12.0–15.0)
Immature Granulocytes: 1 %
Lymphocytes Relative: 13 %
Lymphs Abs: 1.4 10*3/uL (ref 0.7–4.0)
MCH: 34.7 pg — ABNORMAL HIGH (ref 26.0–34.0)
MCHC: 32.6 g/dL (ref 30.0–36.0)
MCV: 106.7 fL — ABNORMAL HIGH (ref 80.0–100.0)
Monocytes Absolute: 2.2 10*3/uL — ABNORMAL HIGH (ref 0.1–1.0)
Monocytes Relative: 20 %
Neutro Abs: 7.1 10*3/uL (ref 1.7–7.7)
Neutrophils Relative %: 66 %
Platelets: 105 10*3/uL — ABNORMAL LOW (ref 150–400)
RBC: 2.85 MIL/uL — ABNORMAL LOW (ref 3.87–5.11)
RDW: 14.5 % (ref 11.5–15.5)
WBC: 10.7 10*3/uL — ABNORMAL HIGH (ref 4.0–10.5)
nRBC: 0 % (ref 0.0–0.2)

## 2020-07-23 MED ORDER — EPOETIN ALFA-EPBX 40000 UNIT/ML IJ SOLN
40000.0000 [IU] | Freq: Once | INTRAMUSCULAR | Status: AC
  Administered 2020-07-23: 40000 [IU] via SUBCUTANEOUS
  Filled 2020-07-23: qty 1

## 2020-07-23 NOTE — Progress Notes (Signed)
Energy is up and down. States she has been having night sweats for past several months. Appetite is fair; Up and down. Here for possible retacrit today.

## 2020-07-23 NOTE — Progress Notes (Signed)
[place]  Telephone:([phone] [phone]  ID: Vickie Howard OB: October 01, 1935  MR#: [id number]  JJH#:[id number]  Patient Care Team: Adin Hector, MD as PCP - General (Internal Medicine)  CHIEF COMPLAINT: CMML  INTERVAL HISTORY: Patient returns to clinic today for repeat laboratory work, further evaluation, and continuation of Retacrit.  She was last seen in clinic on 04/23/2020.  She last received Retacrit on 06/03/2020.  In the interim, she has done well.  She denies any additional falls. She continues to have chronic weakness and fatigue and a poor memory.  She has a very supportive family and continues to live alone.  Her goal is to stay independent as long as possible.  She has no neurologic complaints. She denies any recent fevers or illnesses. She has a good appetite and denies weight loss. She denies any chest pain, shortness of breath, cough, or hemoptysis. She denies any nausea, vomiting, constipation, or diarrhea.  She has no melena or hematochezia.  She has no urinary complaints.  Patient offers no further specific complaints today.  REVIEW OF SYSTEMS:   Review of Systems  Constitutional: Positive for malaise/fatigue. Negative for fever and weight loss.  Respiratory: Negative.  Negative for cough, hemoptysis and shortness of breath.   Cardiovascular: Negative.  Negative for chest pain and leg swelling.  Gastrointestinal: Negative.  Negative for abdominal pain, blood in stool and melena.  Genitourinary: Negative.  Negative for hematuria.  Musculoskeletal: Negative for back pain, falls and joint pain.  Skin: Negative.  Negative for rash.  Neurological: Positive for weakness. Negative for dizziness, sensory change, focal weakness and headaches.  Psychiatric/Behavioral: Positive for memory loss. The patient is not nervous/anxious.     As per HPI. Otherwise, a complete review of systems is negative.  PAST MEDICAL HISTORY: Past Medical  History:  Diagnosis Date  . Anemia   . Arthritis   . Atrial fibrillation ([place])   . GERD (gastroesophageal reflux disease)   . Heart murmur   . History of hiatal hernia   . HLD (hyperlipidemia)   . HTN (hypertension)   . Occasional tremors    hands    PAST SURGICAL HISTORY: Past Surgical History:  Procedure Laterality Date  . JOINT REPLACEMENT     right total hip 2015  . Left total hip arthroplasty  10/12/2014  . PACEMAKER INSERTION N/A 08/12/2015   Procedure: INSERTION PACEMAKER attempt unable to obtain access ;  Surgeon: Isaias Cowman, MD;  Location: [facility] ORS;  Service: Cardiovascular;  Laterality: N/A;  . TOTAL HIP ARTHROPLASTY Left 10/12/2014   Procedure: TOTAL HIP ARTHROPLASTY;  Surgeon: Dereck Leep, MD;  Location: [facility] ORS;  Service: Orthopedics;  Laterality: Left;  . TUBAL LIGATION      FAMILY HISTORY: Family History  Problem Relation Age of Onset  . Aneurysm Father   . Breast cancer Daughter     ADVANCED DIRECTIVES (Y/N):  N  HEALTH MAINTENANCE: Social History   Tobacco Use  . Smoking status: Never Smoker  . Smokeless tobacco: Never Used  Vaping Use  . Vaping Use: Never used  Substance Use Topics  . Alcohol use: No  . Drug use: No     Colonoscopy:  PAP:  Bone density:  Lipid panel:  Allergies  Allergen Reactions  . Codeine Sulfate Other (See Comments)    Reaction:  Syncope  . Levaquin [Levofloxacin] Nausea And Vomiting  . Oxycodone Other (See Comments)    Reaction:  Disoriented     Current Outpatient  Medications  Medication Sig Dispense Refill  . acetaminophen (TYLENOL) 500 MG tablet Take 1,000 mg by mouth every 6 (six) hours as needed for mild pain or headache.    Marland Kitchen aspirin EC 81 MG tablet Take 81 mg by mouth every other day.     Marland Kitchen atorvastatin (LIPITOR) 20 MG tablet Take 20 mg by mouth daily.    . Cyanocobalamin (VITAMIN B 12 PO) Take 1 mcg by mouth daily.    Marland Kitchen docusate sodium (COLACE) 100 MG capsule Take 100 mg by mouth 2 (two) times  daily.    Marland Kitchen donepezil (ARICEPT) 5 MG tablet Take 5 mg by mouth daily.    . feeding supplement, ENSURE ENLIVE, (ENSURE ENLIVE) LIQD Take 237 mLs by mouth 2 (two) times daily between meals. 237 mL 12  . ferrous sulfate 325 (65 FE) MG tablet Take 325 mg by mouth daily with breakfast.    . LANOXIN 62.5 MCG TABS TK 1 T PO ONCE D  11  . lisinopril (PRINIVIL,ZESTRIL) 2.5 MG tablet Take 2.5 mg by mouth daily.   1  . mirtazapine (REMERON) 15 MG tablet Take 1 tablet by mouth 1 day or 1 dose.    . Multiple Vitamin (MULTI-VITAMIN) tablet Take 1 tablet by mouth 1 day or 1 dose.    . Multiple Vitamins-Minerals (PRESERVISION AREDS 2) CAPS Take 1 capsule by mouth daily.    Marland Kitchen omeprazole (PRILOSEC) 20 MG capsule Take 20 mg by mouth daily.    . sertraline (ZOLOFT) 50 MG tablet Take 75 mg by mouth daily. Patient taking $RemoveBefore'75mg'[id number]$  daily per Dr. Jens Som      No current facility-administered medications for this visit.    OBJECTIVE: There were no vitals filed for this visit.   There is no height or weight on file to calculate BMI.    ECOG FS:0 - Asymptomatic  Physical Exam Constitutional:      General: Vital signs are normal.     Appearance: Normal appearance.  HENT:     Head: Normocephalic and atraumatic.  Eyes:     Pupils: Pupils are equal, round, and reactive to light.  Cardiovascular:     Rate and Rhythm: Normal rate and regular rhythm.     Heart sounds: Normal heart sounds. No murmur heard.   Pulmonary:     Effort: Pulmonary effort is normal.     Breath sounds: Normal breath sounds. No wheezing.  Abdominal:     General: Bowel sounds are normal. There is no distension.     Palpations: Abdomen is soft.     Tenderness: There is no abdominal tenderness.  Musculoskeletal:        General: No edema. Normal range of motion.     Cervical back: Normal range of motion.  Skin:    General: Skin is warm and dry.     Findings: No rash.  Neurological:     Mental Status: She is alert and oriented to person, place,  and time.  Psychiatric:        Judgment: Judgment normal.        LAB RESULTS:  Lab Results  Component Value Date   NA 138 06/03/2020   K 3.9 06/03/2020   CL 105 06/03/2020   CO2 26 06/03/2020   GLUCOSE 94 06/03/2020   BUN 22 06/03/2020   CREATININE 0.69 06/03/2020   CALCIUM 9.3 06/03/2020   PROT 6.9 10/29/2014   ALBUMIN 3.2 (L) 10/29/2014   AST 18 10/29/2014   ALT 18 10/29/2014  ALKPHOS 85 10/29/2014   BILITOT 0.2 (L) 10/29/2014   GFRNONAA >60 06/03/2020   GFRAA >60 02/25/2020    Lab Results  Component Value Date   WBC 8.3 06/03/2020   NEUTROABS 5.2 06/03/2020   HGB 9.5 (L) 06/03/2020   HCT 29.0 (L) 06/03/2020   MCV 107.0 (H) 06/03/2020   PLT 103 (L) 06/03/2020     STUDIES: No results found.  ASSESSMENT: CMML.  PLAN:   1. CMML:  -Confirmed with bone marrow biopsy on 06/18/2018. -Bone marrow consistent with hypercellular bone marrow with mild dysplasia with no increased blast. -Labs from 07/23/2020 show a WBC of 10.7, hemoglobin 9.9 and platelets 105,000 -Given her age and performance status she has not been started on treatment at this time. -We will continue supportive care with Retacrit every 6 weeks as needed.  2.  Anemia:  -Secondary to CMML. -Labs from today show a hemoglobin of 9.9. -Proceed with Retacrit. -RTC in 3 months for further evaluation and possible Retacrit.  3.  Elevated MCV:  -Secondary to CMML. -B12 and folate have been within normal range.    3.  Thrombocytopenia:  -Chronic and unchanged.   -Patient's platelet count is 105 today.  Disposition: -Proceed with Retacrit today. -RTC in 6 weeks for labs (CBC, CMP) and possible Retacrit. -RTC in 12 weeks for labs (CBC, CMP, myeloma labs), md assess and possible retacrit.   Patient expressed understanding and was in agreement with this plan. She also understands that She can call clinic at any time with any questions, concerns, or complaints.    Jacquelin Hawking, NP   07/23/2020  10:50 AM

## 2020-07-24 LAB — IGG, IGA, IGM
IgA: 78 mg/dL (ref 64–422)
IgG (Immunoglobin G), Serum: 1567 mg/dL (ref 586–1602)
IgM (Immunoglobulin M), Srm: 138 mg/dL (ref 26–217)

## 2020-07-26 LAB — KAPPA/LAMBDA LIGHT CHAINS
Kappa free light chain: 23.6 mg/L — ABNORMAL HIGH (ref 3.3–19.4)
Kappa, lambda light chain ratio: 1.69 — ABNORMAL HIGH (ref 0.26–1.65)
Lambda free light chains: 14 mg/L (ref 5.7–26.3)

## 2020-07-26 LAB — PROTEIN ELECTROPHORESIS, SERUM
A/G Ratio: 1.1 (ref 0.7–1.7)
Albumin ELP: 3.7 g/dL (ref 2.9–4.4)
Alpha-1-Globulin: 0.2 g/dL (ref 0.0–0.4)
Alpha-2-Globulin: 0.7 g/dL (ref 0.4–1.0)
Beta Globulin: 0.9 g/dL (ref 0.7–1.3)
Gamma Globulin: 1.5 g/dL (ref 0.4–1.8)
Globulin, Total: 3.3 g/dL (ref 2.2–3.9)
M-Spike, %: 1.1 g/dL — ABNORMAL HIGH
Total Protein ELP: 7 g/dL (ref 6.0–8.5)

## 2020-08-27 ENCOUNTER — Inpatient Hospital Stay: Payer: 59 | Attending: Oncology

## 2020-08-27 ENCOUNTER — Inpatient Hospital Stay: Payer: 59

## 2020-08-27 ENCOUNTER — Other Ambulatory Visit: Payer: Self-pay

## 2020-08-27 VITALS — BP 170/71 | HR 63

## 2020-08-27 DIAGNOSIS — C9310 Chronic myelomonocytic leukemia not having achieved remission: Secondary | ICD-10-CM | POA: Diagnosis present

## 2020-08-27 DIAGNOSIS — D630 Anemia in neoplastic disease: Secondary | ICD-10-CM | POA: Diagnosis present

## 2020-08-27 LAB — CBC WITH DIFFERENTIAL/PLATELET
Abs Immature Granulocytes: 0 10*3/uL (ref 0.00–0.07)
Basophils Absolute: 0 10*3/uL (ref 0.0–0.1)
Basophils Relative: 0 %
Eosinophils Absolute: 0 10*3/uL (ref 0.0–0.5)
Eosinophils Relative: 0 %
HCT: 30.3 % — ABNORMAL LOW (ref 36.0–46.0)
Hemoglobin: 9.9 g/dL — ABNORMAL LOW (ref 12.0–15.0)
Lymphocytes Relative: 16 %
Lymphs Abs: 2.1 10*3/uL (ref 0.7–4.0)
MCH: 34.7 pg — ABNORMAL HIGH (ref 26.0–34.0)
MCHC: 32.7 g/dL (ref 30.0–36.0)
MCV: 106.3 fL — ABNORMAL HIGH (ref 80.0–100.0)
Monocytes Absolute: 2.9 10*3/uL — ABNORMAL HIGH (ref 0.1–1.0)
Monocytes Relative: 22 %
Neutro Abs: 8.2 10*3/uL — ABNORMAL HIGH (ref 1.7–7.7)
Neutrophils Relative %: 62 %
Platelets: 98 10*3/uL — ABNORMAL LOW (ref 150–400)
RBC: 2.85 MIL/uL — ABNORMAL LOW (ref 3.87–5.11)
RDW: 14.7 % (ref 11.5–15.5)
Smear Review: DECREASED
WBC: 13.3 10*3/uL — ABNORMAL HIGH (ref 4.0–10.5)
nRBC: 0 % (ref 0.0–0.2)

## 2020-08-27 LAB — COMPREHENSIVE METABOLIC PANEL
ALT: 19 U/L (ref 0–44)
AST: 30 U/L (ref 15–41)
Albumin: 4.4 g/dL (ref 3.5–5.0)
Alkaline Phosphatase: 57 U/L (ref 38–126)
Anion gap: 8 (ref 5–15)
BUN: 18 mg/dL (ref 8–23)
CO2: 26 mmol/L (ref 22–32)
Calcium: 9.2 mg/dL (ref 8.9–10.3)
Chloride: 105 mmol/L (ref 98–111)
Creatinine, Ser: 0.72 mg/dL (ref 0.44–1.00)
GFR, Estimated: >60 mL/min (ref >60)
Glucose, Bld: 91 mg/dL (ref 70–99)
Potassium: 3.8 mmol/L (ref 3.5–5.1)
Sodium: 139 mmol/L (ref 135–145)
Total Bilirubin: 0.5 mg/dL (ref 0.3–1.2)
Total Protein: 7.9 g/dL (ref 6.5–8.1)

## 2020-08-27 MED ORDER — EPOETIN ALFA-EPBX 40000 UNIT/ML IJ SOLN
40000.0000 [IU] | Freq: Once | INTRAMUSCULAR | Status: AC
  Administered 2020-08-27: 40000 [IU] via SUBCUTANEOUS
  Filled 2020-08-27: qty 1

## 2020-10-15 ENCOUNTER — Other Ambulatory Visit: Payer: Self-pay

## 2020-10-15 ENCOUNTER — Inpatient Hospital Stay: Payer: 59

## 2020-10-15 ENCOUNTER — Inpatient Hospital Stay (HOSPITAL_BASED_OUTPATIENT_CLINIC_OR_DEPARTMENT_OTHER): Payer: 59 | Admitting: Oncology

## 2020-10-15 ENCOUNTER — Encounter: Payer: Self-pay | Admitting: Oncology

## 2020-10-15 ENCOUNTER — Inpatient Hospital Stay: Payer: 59 | Attending: Oncology

## 2020-10-15 VITALS — BP 163/59 | HR 62 | Temp 97.7°F | Resp 17 | Wt 95.4 lb

## 2020-10-15 DIAGNOSIS — J069 Acute upper respiratory infection, unspecified: Secondary | ICD-10-CM | POA: Diagnosis not present

## 2020-10-15 DIAGNOSIS — D630 Anemia in neoplastic disease: Secondary | ICD-10-CM | POA: Diagnosis not present

## 2020-10-15 DIAGNOSIS — C9310 Chronic myelomonocytic leukemia not having achieved remission: Secondary | ICD-10-CM

## 2020-10-15 DIAGNOSIS — D72829 Elevated white blood cell count, unspecified: Secondary | ICD-10-CM | POA: Insufficient documentation

## 2020-10-15 DIAGNOSIS — R531 Weakness: Secondary | ICD-10-CM | POA: Insufficient documentation

## 2020-10-15 DIAGNOSIS — D696 Thrombocytopenia, unspecified: Secondary | ICD-10-CM | POA: Insufficient documentation

## 2020-10-15 LAB — CBC WITH DIFFERENTIAL/PLATELET
Abs Immature Granulocytes: 0.11 10*3/uL — ABNORMAL HIGH (ref 0.00–0.07)
Basophils Absolute: 0.1 10*3/uL (ref 0.0–0.1)
Basophils Relative: 0 %
Eosinophils Absolute: 0 10*3/uL (ref 0.0–0.5)
Eosinophils Relative: 0 %
HCT: 28.9 % — ABNORMAL LOW (ref 36.0–46.0)
Hemoglobin: 9.5 g/dL — ABNORMAL LOW (ref 12.0–15.0)
Immature Granulocytes: 1 %
Lymphocytes Relative: 9 %
Lymphs Abs: 1.2 10*3/uL (ref 0.7–4.0)
MCH: 34.9 pg — ABNORMAL HIGH (ref 26.0–34.0)
MCHC: 32.9 g/dL (ref 30.0–36.0)
MCV: 106.3 fL — ABNORMAL HIGH (ref 80.0–100.0)
Monocytes Absolute: 2.4 10*3/uL — ABNORMAL HIGH (ref 0.1–1.0)
Monocytes Relative: 18 %
Neutro Abs: 9.5 10*3/uL — ABNORMAL HIGH (ref 1.7–7.7)
Neutrophils Relative %: 72 %
Platelets: 99 10*3/uL — ABNORMAL LOW (ref 150–400)
RBC: 2.72 MIL/uL — ABNORMAL LOW (ref 3.87–5.11)
RDW: 16.1 % — ABNORMAL HIGH (ref 11.5–15.5)
WBC: 13.2 10*3/uL — ABNORMAL HIGH (ref 4.0–10.5)
nRBC: 0 % (ref 0.0–0.2)

## 2020-10-15 LAB — BASIC METABOLIC PANEL
Anion gap: 9 (ref 5–15)
BUN: 24 mg/dL — ABNORMAL HIGH (ref 8–23)
CO2: 24 mmol/L (ref 22–32)
Calcium: 9.1 mg/dL (ref 8.9–10.3)
Chloride: 106 mmol/L (ref 98–111)
Creatinine, Ser: 0.7 mg/dL (ref 0.44–1.00)
GFR, Estimated: >60 mL/min (ref >60)
Glucose, Bld: 94 mg/dL (ref 70–99)
Potassium: 3.7 mmol/L (ref 3.5–5.1)
Sodium: 139 mmol/L (ref 135–145)

## 2020-10-15 MED ORDER — EPOETIN ALFA-EPBX 40000 UNIT/ML IJ SOLN
40000.0000 [IU] | Freq: Once | INTRAMUSCULAR | Status: AC
Start: 2020-10-15 — End: 2020-10-15
  Administered 2020-10-15: 40000 [IU] via SUBCUTANEOUS
  Filled 2020-10-15: qty 1

## 2020-10-15 NOTE — Progress Notes (Signed)
[facility]  [phone] [phone]  ID: Vickie Howard OB: 10/23/1935  MR#: [id number]  CSN#:[id number]  Patient Care Team: Klein, Bert J III, MD as PCP - General (Internal Medicine)  CHIEF COMPLAINT: CMML  INTERVAL HISTORY: Patient returns to clinic today for repeat laboratory work, further evaluation, and continuation of Retacrit.    She was last seen in clinic on 07/23/2020.  She last received Retacrit on 08/27/20.   She saw Dr. Klein on 09/07/2020 for a cough.  She was negative for COVID.  She was placed on an antibiotic.  Symptoms improved.  In the interim, she has done well.  She denies any recurrent falls. She continues to have chronic weakness and fatigue and a poor memory.  Has some shortness of breath with exertion.  She has a very supportive family and continues to live alone.  Her goal is to stay independent as long as possible.  She is walking twice per day first thing in the morning and before bedtime.  Has some allergy-like symptoms but nothing acute.  She cooks at home and eats 3 meals per day.   REVIEW OF SYSTEMS:   Review of Systems  Constitutional: Positive for malaise/fatigue. Negative for fever and weight loss.  Respiratory: Positive for shortness of breath. Negative for cough and hemoptysis.   Cardiovascular: Negative.  Negative for chest pain and leg swelling.  Gastrointestinal: Negative.  Negative for abdominal pain, blood in stool and melena.  Genitourinary: Negative.  Negative for hematuria.  Musculoskeletal: Negative for back pain, falls and joint pain.  Skin: Negative.  Negative for rash.  Neurological: Positive for weakness. Negative for dizziness, sensory change, focal weakness and headaches.  Psychiatric/Behavioral: Positive for memory loss. The patient is not nervous/anxious.     As per HPI. Otherwise, a complete review of systems is negative.  PAST MEDICAL HISTORY: Past Medical History:  Diagnosis Date  .  Anemia   . Arthritis   . Atrial fibrillation ([facility])   . GERD (gastroesophageal reflux disease)   . Heart murmur   . History of hiatal hernia   . HLD (hyperlipidemia)   . HTN (hypertension)   . Occasional tremors    hands    PAST SURGICAL HISTORY: Past Surgical History:  Procedure Laterality Date  . JOINT REPLACEMENT     right total hip 2015  . Left total hip arthroplasty  10/12/2014  . PACEMAKER INSERTION N/A 08/12/2015   Procedure: INSERTION PACEMAKER attempt unable to obtain access ;  Surgeon: Alexander Paraschos, MD;  Location: [facility] ORS;  Service: Cardiovascular;  Laterality: N/A;  . TOTAL HIP ARTHROPLASTY Left 10/12/2014   Procedure: TOTAL HIP ARTHROPLASTY;  Surgeon: James P Hooten, MD;  Location: [facility] ORS;  Service: Orthopedics;  Laterality: Left;  . TUBAL LIGATION      FAMILY HISTORY: Family History  Problem Relation Age of Onset  . Aneurysm Father   . Breast cancer Daughter     ADVANCED DIRECTIVES (Y/N):  N  HEALTH MAINTENANCE: Social History   Tobacco Use  . Smoking status: Never Smoker  . Smokeless tobacco: Never Used  Vaping Use  . Vaping Use: Never used  Substance Use Topics  . Alcohol use: No  . Drug use: No     Colonoscopy:  PAP:  Bone density:  Lipid panel:  Allergies  Allergen Reactions  . Codeine Sulfate Other (See Comments)    Reaction:  Syncope  . Levaquin [Levofloxacin] Nausea And Vomiting  . Oxycodone Other (See Comments)      Reaction:  Disoriented     Current Outpatient Medications  Medication Sig Dispense Refill  . acetaminophen (TYLENOL) 500 MG tablet Take 1,000 mg by mouth every 6 (six) hours as needed for mild pain or headache.    . aspirin EC 81 MG tablet Take 81 mg by mouth every other day.     . atorvastatin (LIPITOR) 20 MG tablet Take 20 mg by mouth daily.    . Cyanocobalamin (VITAMIN B 12 PO) Take 1 mcg by mouth daily.    . docusate sodium (COLACE) 100 MG capsule Take 100 mg by mouth 2 (two) times daily.    . donepezil  (ARICEPT) 5 MG tablet Take 5 mg by mouth daily.    . feeding supplement, ENSURE ENLIVE, (ENSURE ENLIVE) LIQD Take 237 mLs by mouth 2 (two) times daily between meals. 237 mL 12  . ferrous sulfate 325 (65 FE) MG tablet Take 325 mg by mouth daily with breakfast.    . LANOXIN 62.5 MCG TABS TK 1 T PO ONCE D  11  . lisinopril (PRINIVIL,ZESTRIL) 2.5 MG tablet Take 2.5 mg by mouth daily.   1  . mirtazapine (REMERON) 15 MG tablet Take 1 tablet by mouth 1 day or 1 dose.    . Multiple Vitamin (MULTI-VITAMIN) tablet Take 1 tablet by mouth 1 day or 1 dose.    . Multiple Vitamins-Minerals (PRESERVISION AREDS 2) CAPS Take 1 capsule by mouth daily.    . omeprazole (PRILOSEC) 20 MG capsule Take 20 mg by mouth daily.    . pramipexole (MIRAPEX) 0.125 MG tablet Take 0.125 mg by mouth 2 (two) times daily.    . sertraline (ZOLOFT) 50 MG tablet Take 75 mg by mouth daily. Patient taking 75mg daily per Dr. Kline     No current facility-administered medications for this visit.    OBJECTIVE: Vitals:   10/15/20 1030  BP: (!) 163/59  Pulse: 62  Resp: 17  Temp: 97.7 F (36.5 C)  SpO2: 97%     Body mass index is 19.27 kg/m.    ECOG FS:0 - Asymptomatic  Physical Exam Constitutional:      Appearance: Normal appearance.  HENT:     Head: Normocephalic and atraumatic.  Eyes:     Pupils: Pupils are equal, round, and reactive to light.  Cardiovascular:     Rate and Rhythm: Normal rate and regular rhythm.     Heart sounds: Normal heart sounds. No murmur heard.   Pulmonary:     Effort: Pulmonary effort is normal.     Breath sounds: Normal breath sounds. No wheezing.  Abdominal:     General: Bowel sounds are normal. There is no distension.     Palpations: Abdomen is soft.     Tenderness: There is no abdominal tenderness.  Musculoskeletal:        General: Normal range of motion.     Cervical back: Normal range of motion.  Skin:    General: Skin is warm and dry.     Findings: No rash.  Neurological:      Mental Status: She is alert and oriented to person, place, and time.  Psychiatric:        Judgment: Judgment normal.        LAB RESULTS:  Lab Results  Component Value Date   NA 139 10/15/2020   K 3.7 10/15/2020   CL 106 10/15/2020   CO2 24 10/15/2020   GLUCOSE 94 10/15/2020   BUN 24 (H) 10/15/2020   CREATININE 0.70   10/15/2020   CALCIUM 9.1 10/15/2020   PROT 7.9 08/27/2020   ALBUMIN 4.4 08/27/2020   AST 30 08/27/2020   ALT 19 08/27/2020   ALKPHOS 57 08/27/2020   BILITOT 0.5 08/27/2020   GFRNONAA >60 10/15/2020   GFRAA >60 02/25/2020    Lab Results  Component Value Date   WBC 13.2 (H) 10/15/2020   NEUTROABS 9.5 (H) 10/15/2020   HGB 9.5 (L) 10/15/2020   HCT 28.9 (L) 10/15/2020   MCV 106.3 (H) 10/15/2020   PLT 99 (L) 10/15/2020     STUDIES: No results found.  ASSESSMENT: CMML.  PLAN:   1. CMML:  -Confirmed with bone marrow biopsy on 06/18/2018. -Bone marrow consistent with hypercellular bone marrow with mild dysplasia with no increased blast. -Labs from 10/15/2020 show a WBC of 13.2, hemoglobin 9.5 and platelet count 99,000. -Given her age and performance status she has not been started on treatment at this time. -We will continue supportive care with Retacrit every 6 weeks as needed.  2.  Anemia:  -Secondary to CMML. -Labs from today show a hemoglobin of 9.5 -Proceed with Retacrit. -RTC in 6 weeks for labs only and 12 weeks for further evaluation and possible Retacrit.  3.  Elevated MCV:  -Secondary to CMML. -B12 and folate have been within normal range.    3.  Thrombocytopenia:  -Chronic and unchanged.   -Patient's platelet count is 99 today.  4.  Leukocytosis: -WBC 13.2 today.  Previously 13.3. -She was treated with an antibiotic about 5 weeks ago for an upper respiratory infection. -Continue to monitor.  Disposition: -Proceed with Retacrit today. -RTC in 6 weeks for labs (CBC, CMP) and possible Retacrit. -RTC in 12 weeks for labs (CBC, CMP,  myeloma labs), md assess and possible retacrit.   Patient expressed understanding and was in agreement with this plan. She also understands that She can call clinic at any time with any questions, concerns, or complaints.   Greater than 50% was spent in counseling and coordination of care with this patient including but not limited to discussion of the relevant topics above (See A&P) including, but not limited to diagnosis and management of acute and chronic medical conditions.    Jacquelin Hawking, NP   10/15/2020 10:48 AM

## 2020-10-16 LAB — IGG, IGA, IGM
IgA: 78 mg/dL (ref 64–422)
IgG (Immunoglobin G), Serum: 1553 mg/dL (ref 586–1602)
IgM (Immunoglobulin M), Srm: 140 mg/dL (ref 26–217)

## 2020-10-18 LAB — PROTEIN ELECTROPHORESIS, SERUM
A/G Ratio: 1.2 (ref 0.7–1.7)
Albumin ELP: 3.8 g/dL (ref 2.9–4.4)
Alpha-1-Globulin: 0.3 g/dL (ref 0.0–0.4)
Alpha-2-Globulin: 0.8 g/dL (ref 0.4–1.0)
Beta Globulin: 0.8 g/dL (ref 0.7–1.3)
Gamma Globulin: 1.4 g/dL (ref 0.4–1.8)
Globulin, Total: 3.3 g/dL (ref 2.2–3.9)
M-Spike, %: 1 g/dL — ABNORMAL HIGH
Total Protein ELP: 7.1 g/dL (ref 6.0–8.5)

## 2020-10-18 LAB — KAPPA/LAMBDA LIGHT CHAINS
Kappa free light chain: 21.7 mg/L — ABNORMAL HIGH (ref 3.3–19.4)
Kappa, lambda light chain ratio: 1.47 (ref 0.26–1.65)
Lambda free light chains: 14.8 mg/L (ref 5.7–26.3)

## 2020-11-26 ENCOUNTER — Inpatient Hospital Stay: Payer: 59

## 2020-11-26 ENCOUNTER — Inpatient Hospital Stay: Payer: 59 | Attending: Oncology

## 2020-11-26 ENCOUNTER — Other Ambulatory Visit: Payer: Self-pay

## 2020-11-26 VITALS — BP 170/62 | HR 62

## 2020-11-26 DIAGNOSIS — D630 Anemia in neoplastic disease: Secondary | ICD-10-CM | POA: Insufficient documentation

## 2020-11-26 DIAGNOSIS — C9310 Chronic myelomonocytic leukemia not having achieved remission: Secondary | ICD-10-CM

## 2020-11-26 LAB — COMPREHENSIVE METABOLIC PANEL
ALT: 19 U/L (ref 0–44)
AST: 27 U/L (ref 15–41)
Albumin: 4 g/dL (ref 3.5–5.0)
Alkaline Phosphatase: 56 U/L (ref 38–126)
Anion gap: 8 (ref 5–15)
BUN: 23 mg/dL (ref 8–23)
CO2: 26 mmol/L (ref 22–32)
Calcium: 9.4 mg/dL (ref 8.9–10.3)
Chloride: 105 mmol/L (ref 98–111)
Creatinine, Ser: 0.84 mg/dL (ref 0.44–1.00)
GFR, Estimated: >60 mL/min (ref >60)
Glucose, Bld: 92 mg/dL (ref 70–99)
Potassium: 4.1 mmol/L (ref 3.5–5.1)
Sodium: 139 mmol/L (ref 135–145)
Total Bilirubin: 0.6 mg/dL (ref 0.3–1.2)
Total Protein: 7.4 g/dL (ref 6.5–8.1)

## 2020-11-26 LAB — CBC
HCT: 28.9 % — ABNORMAL LOW (ref 36.0–46.0)
Hemoglobin: 9.5 g/dL — ABNORMAL LOW (ref 12.0–15.0)
MCH: 34.5 pg — ABNORMAL HIGH (ref 26.0–34.0)
MCHC: 32.9 g/dL (ref 30.0–36.0)
MCV: 105.1 fL — ABNORMAL HIGH (ref 80.0–100.0)
Platelets: 112 10*3/uL — ABNORMAL LOW (ref 150–400)
RBC: 2.75 MIL/uL — ABNORMAL LOW (ref 3.87–5.11)
RDW: 15.5 % (ref 11.5–15.5)
WBC: 13.8 10*3/uL — ABNORMAL HIGH (ref 4.0–10.5)
nRBC: 0 % (ref 0.0–0.2)

## 2020-11-26 MED ORDER — EPOETIN ALFA-EPBX 40000 UNIT/ML IJ SOLN
40000.0000 [IU] | Freq: Once | INTRAMUSCULAR | Status: AC
Start: 2020-11-26 — End: 2020-11-26
  Administered 2020-11-26: 40000 [IU] via SUBCUTANEOUS
  Filled 2020-11-26: qty 1

## 2020-11-27 LAB — IGG, IGA, IGM
IgA: 74 mg/dL (ref 64–422)
IgG (Immunoglobin G), Serum: 1418 mg/dL (ref 586–1602)
IgM (Immunoglobulin M), Srm: 136 mg/dL (ref 26–217)

## 2020-11-29 LAB — PROTEIN ELECTROPHORESIS, SERUM
A/G Ratio: 1.1 (ref 0.7–1.7)
Albumin ELP: 3.7 g/dL (ref 2.9–4.4)
Alpha-1-Globulin: 0.3 g/dL (ref 0.0–0.4)
Alpha-2-Globulin: 0.8 g/dL (ref 0.4–1.0)
Beta Globulin: 0.9 g/dL (ref 0.7–1.3)
Gamma Globulin: 1.4 g/dL (ref 0.4–1.8)
Globulin, Total: 3.4 g/dL (ref 2.2–3.9)
M-Spike, %: 1.1 g/dL — ABNORMAL HIGH
Total Protein ELP: 7.1 g/dL (ref 6.0–8.5)

## 2020-11-29 LAB — KAPPA/LAMBDA LIGHT CHAINS
Kappa free light chain: 24.9 mg/L — ABNORMAL HIGH (ref 3.3–19.4)
Kappa, lambda light chain ratio: 1.71 — ABNORMAL HIGH (ref 0.26–1.65)
Lambda free light chains: 14.6 mg/L (ref 5.7–26.3)

## 2020-12-21 ENCOUNTER — Encounter: Payer: Self-pay | Admitting: Oncology

## 2020-12-21 ENCOUNTER — Telehealth: Payer: Self-pay | Admitting: Oncology

## 2020-12-21 NOTE — Telephone Encounter (Signed)
Re: labs  Recent lab work looks pretty stable except for slow trend up of her white blood cell count.   Patient denies any recent infections.  CBC Latest Ref Rng & Units 11/26/2020 10/15/2020 08/27/2020  WBC 4.0 - 10.5 K/uL 13.8(H) 13.2(H) 13.3(H)  Hemoglobin 12.0 - 15.0 g/dL 9.5(L) 9.5(L) 9.9(L)  Hematocrit 36.0 - 46.0 % 28.9(L) 28.9(L) 30.3(L)  Platelets 150 - 400 K/uL 112(L) 99(L) 98(L)   She last received Retacrit at the end of June 2022.  She has scheduled follow-up next month.  Faythe Casa, NP 12/21/2020 8:23 AM

## 2021-01-07 ENCOUNTER — Other Ambulatory Visit: Payer: Self-pay | Admitting: Oncology

## 2021-01-07 DIAGNOSIS — C9310 Chronic myelomonocytic leukemia not having achieved remission: Secondary | ICD-10-CM

## 2021-01-07 NOTE — Progress Notes (Signed)
[place]  [phone] [phone]) [phone]  ID: Demetrio Lapping OB: [birth year]  MR#: [id number]  OTL#:[id number]  Patient Care Team: Adin Hector, MD as PCP - General (Internal Medicine)  CHIEF COMPLAINT: CMML  INTERVAL HISTORY: Patient returns to clinic today for repeat laboratory work, further evaluation, and continuation of Retacrit.  She has chronic weakness and fatigue.  She admits to "good days and bad days", but otherwise feels well.  She has no neurologic complaints. She denies any recent fevers or illnesses. She has a good appetite and denies weight loss. She denies any chest pain, shortness of breath, cough, or hemoptysis. She denies any nausea, vomiting, constipation, or diarrhea.  She has no melena or hematochezia.  She has no urinary complaints.  Patient offers no further specific complaints today.  REVIEW OF SYSTEMS:   Review of Systems  Constitutional:  Positive for malaise/fatigue. Negative for fever and weight loss.  Respiratory: Negative.  Negative for cough, hemoptysis and shortness of breath.   Cardiovascular: Negative.  Negative for chest pain and leg swelling.  Gastrointestinal: Negative.  Negative for abdominal pain, blood in stool and melena.  Genitourinary: Negative.  Negative for hematuria.  Musculoskeletal: Negative.  Negative for back pain, falls and joint pain.  Skin: Negative.  Negative for rash.  Neurological:  Positive for weakness. Negative for dizziness, sensory change, focal weakness and headaches.  Psychiatric/Behavioral:  Positive for memory loss. The patient is not nervous/anxious.    As per HPI. Otherwise, a complete review of systems is negative.  PAST MEDICAL HISTORY: Past Medical History:  Diagnosis Date   Anemia    Arthritis    Atrial fibrillation ([facility])    GERD (gastroesophageal reflux disease)    Heart murmur    History of hiatal hernia    HLD (hyperlipidemia)    HTN (hypertension)    Occasional  tremors    hands    PAST SURGICAL HISTORY: Past Surgical History:  Procedure Laterality Date   JOINT REPLACEMENT     right total hip 2015   Left total hip arthroplasty  10/12/2014   PACEMAKER INSERTION N/A 08/12/2015   Procedure: INSERTION PACEMAKER attempt unable to obtain access ;  Surgeon: Isaias Cowman, MD;  Location: [facility] ORS;  Service: Cardiovascular;  Laterality: N/A;   TOTAL HIP ARTHROPLASTY Left 10/12/2014   Procedure: TOTAL HIP ARTHROPLASTY;  Surgeon: Dereck Leep, MD;  Location: [facility] ORS;  Service: Orthopedics;  Laterality: Left;   TUBAL LIGATION      FAMILY HISTORY: Family History  Problem Relation Age of Onset   Aneurysm Father    Breast cancer Daughter     ADVANCED DIRECTIVES (Y/N):  N  HEALTH MAINTENANCE: Social History   Tobacco Use   Smoking status: Never   Smokeless tobacco: Never  Vaping Use   Vaping Use: Never used  Substance Use Topics   Alcohol use: No   Drug use: No     Colonoscopy:  PAP:  Bone density:  Lipid panel:  Allergies  Allergen Reactions   Codeine Sulfate Other (See Comments)    Reaction:  Syncope   Levaquin [Levofloxacin] Nausea And Vomiting   Oxycodone Other (See Comments)    Reaction:  Disoriented     Current Outpatient Medications  Medication Sig Dispense Refill   acetaminophen (TYLENOL) 500 MG tablet Take 1,000 mg by mouth every 6 (six) hours as needed for mild pain or headache.     aspirin EC 81 MG tablet Take 81 mg by mouth  every other day.      atorvastatin (LIPITOR) 20 MG tablet Take 20 mg by mouth daily.     Cyanocobalamin (VITAMIN B 12 PO) Take 1 mcg by mouth daily.     docusate sodium (COLACE) 100 MG capsule Take 100 mg by mouth 2 (two) times daily.     donepezil (ARICEPT) 5 MG tablet Take 5 mg by mouth daily.     feeding supplement, ENSURE ENLIVE, (ENSURE ENLIVE) LIQD Take 237 mLs by mouth 2 (two) times daily between meals. 237 mL 12   ferrous sulfate 325 (65 FE) MG tablet Take 325 mg by mouth daily with  breakfast.     LANOXIN 62.5 MCG TABS TK 1 T PO ONCE D  11   lisinopril (PRINIVIL,ZESTRIL) 2.5 MG tablet Take 2.5 mg by mouth daily.   1   mirtazapine (REMERON) 15 MG tablet Take 1 tablet by mouth 1 day or 1 dose.     Multiple Vitamin (MULTI-VITAMIN) tablet Take 1 tablet by mouth 1 day or 1 dose.     Multiple Vitamins-Minerals (PRESERVISION AREDS 2) CAPS Take 1 capsule by mouth daily.     omeprazole (PRILOSEC) 20 MG capsule Take 20 mg by mouth daily.     pramipexole (MIRAPEX) 0.125 MG tablet Take 0.125 mg by mouth 2 (two) times daily.     sertraline (ZOLOFT) 50 MG tablet Take 75 mg by mouth daily. Patient taking 37m daily per Dr. KJens Som    No current facility-administered medications for this visit.    OBJECTIVE: Vitals:   01/11/21 1017  BP: (!) 161/61  Pulse: 64  Resp: 16  Temp: 98.8 F (37.1 C)     Body mass index is 19.21 kg/m.    ECOG FS:0 - Asymptomatic  General: Thin, no acute distress. Eyes: Pink conjunctiva, anicteric sclera. HEENT: Normocephalic, moist mucous membranes. Lungs: No audible wheezing or coughing. Heart: Regular rate and rhythm. Abdomen: Soft, nontender, no obvious distention. Musculoskeletal: No edema, cyanosis, or clubbing. Neuro: Alert, answering all questions appropriately. Cranial nerves grossly intact. Skin: No rashes or petechiae noted. Psych: Normal affect.   LAB RESULTS:  Lab Results  Component Value Date   NA 139 01/11/2021   K 3.7 01/11/2021   CL 103 01/11/2021   CO2 30 01/11/2021   GLUCOSE 100 (H) 01/11/2021   BUN 19 01/11/2021   CREATININE 0.58 01/11/2021   CALCIUM 9.1 01/11/2021   PROT 7.7 01/11/2021   ALBUMIN 4.1 01/11/2021   AST 27 01/11/2021   ALT 19 01/11/2021   ALKPHOS 56 01/11/2021   BILITOT 0.3 01/11/2021   GFRNONAA >60 01/11/2021   GFRAA >60 02/25/2020    Lab Results  Component Value Date   WBC 15.9 (H) 01/11/2021   NEUTROABS 11.4 (H) 01/11/2021   HGB 9.8 (L) 01/11/2021   HCT 29.0 (L) 01/11/2021   MCV 106.2 (H)  01/11/2021   PLT 112 (L) 01/11/2021     STUDIES: No results found.  ASSESSMENT: CMML.  PLAN:   1. CMML: Confirmed on bone marrow biopsy from June 18, 2018.  Patient noted to have a hypercellular marrow with mild dysplasia in all 3 lineages.  No increased blasts.  Patient white blood cell count remains persistently elevated at 15.9 with an absolute monocytosis of 2.8.  These are approximately her baseline.  Given her advanced age and she is asymptomatic, she does not require treatment at this time.  Can consider Vidaza or Dacogen in the future if absolutely necessary, but patient has indicated she  would likely decline treatment.  Proceed with Retacrit today.  Continue supportive care with Retacrit every 6 weeks as needed. 2.  Anemia: Possibly secondary to CMML.  Hemoglobin is 9.8 today, therefore will proceed with Retacrit as scheduled.  Return to clinic in 6 weeks for laboratory work and Retacrit if her hemoglobin remains below 10.0.  Patient will then return to clinic in 3 months for further evaluation and continuation of treatment if needed.   3.  Elevated MCV: Chronic and unchanged.  B12 and folate are within normal limits.  Secondary to CMML.   3.  Thrombocytopenia: Chronic and unchanged.  Patient platelet count is 112 today. 4.  Leukocytosis: Chronic and unchanged.  Secondary to CMML.   Patient expressed understanding and was in agreement with this plan. She also understands that She can call clinic at any time with any questions, concerns, or complaints.    Lloyd Huger, MD   01/11/2021 12:21 PM

## 2021-01-11 ENCOUNTER — Inpatient Hospital Stay: Payer: 59

## 2021-01-11 ENCOUNTER — Inpatient Hospital Stay (HOSPITAL_BASED_OUTPATIENT_CLINIC_OR_DEPARTMENT_OTHER): Payer: 59 | Admitting: Oncology

## 2021-01-11 ENCOUNTER — Inpatient Hospital Stay: Payer: 59 | Attending: Oncology

## 2021-01-11 ENCOUNTER — Encounter: Payer: Self-pay | Admitting: Oncology

## 2021-01-11 VITALS — BP 161/61 | HR 64 | Temp 98.8°F | Resp 16 | Wt 95.1 lb

## 2021-01-11 DIAGNOSIS — D630 Anemia in neoplastic disease: Secondary | ICD-10-CM | POA: Insufficient documentation

## 2021-01-11 DIAGNOSIS — C9310 Chronic myelomonocytic leukemia not having achieved remission: Secondary | ICD-10-CM | POA: Insufficient documentation

## 2021-01-11 DIAGNOSIS — D696 Thrombocytopenia, unspecified: Secondary | ICD-10-CM | POA: Diagnosis not present

## 2021-01-11 LAB — COMPREHENSIVE METABOLIC PANEL
ALT: 19 U/L (ref 0–44)
AST: 27 U/L (ref 15–41)
Albumin: 4.1 g/dL (ref 3.5–5.0)
Alkaline Phosphatase: 56 U/L (ref 38–126)
Anion gap: 6 (ref 5–15)
BUN: 19 mg/dL (ref 8–23)
CO2: 30 mmol/L (ref 22–32)
Calcium: 9.1 mg/dL (ref 8.9–10.3)
Chloride: 103 mmol/L (ref 98–111)
Creatinine, Ser: 0.58 mg/dL (ref 0.44–1.00)
GFR, Estimated: >60 mL/min (ref >60)
Glucose, Bld: 100 mg/dL — ABNORMAL HIGH (ref 70–99)
Potassium: 3.7 mmol/L (ref 3.5–5.1)
Sodium: 139 mmol/L (ref 135–145)
Total Bilirubin: 0.3 mg/dL (ref 0.3–1.2)
Total Protein: 7.7 g/dL (ref 6.5–8.1)

## 2021-01-11 LAB — CBC WITH DIFFERENTIAL/PLATELET
Abs Immature Granulocytes: 0.16 10*3/uL — ABNORMAL HIGH (ref 0.00–0.07)
Basophils Absolute: 0.1 10*3/uL (ref 0.0–0.1)
Basophils Relative: 0 %
Eosinophils Absolute: 0 10*3/uL (ref 0.0–0.5)
Eosinophils Relative: 0 %
HCT: 29 % — ABNORMAL LOW (ref 36.0–46.0)
Hemoglobin: 9.8 g/dL — ABNORMAL LOW (ref 12.0–15.0)
Immature Granulocytes: 1 %
Lymphocytes Relative: 9 %
Lymphs Abs: 1.5 10*3/uL (ref 0.7–4.0)
MCH: 35.9 pg — ABNORMAL HIGH (ref 26.0–34.0)
MCHC: 33.8 g/dL (ref 30.0–36.0)
MCV: 106.2 fL — ABNORMAL HIGH (ref 80.0–100.0)
Monocytes Absolute: 2.8 10*3/uL — ABNORMAL HIGH (ref 0.1–1.0)
Monocytes Relative: 18 %
Neutro Abs: 11.4 10*3/uL — ABNORMAL HIGH (ref 1.7–7.7)
Neutrophils Relative %: 72 %
Platelets: 112 10*3/uL — ABNORMAL LOW (ref 150–400)
RBC: 2.73 MIL/uL — ABNORMAL LOW (ref 3.87–5.11)
RDW: 15.7 % — ABNORMAL HIGH (ref 11.5–15.5)
Smear Review: NORMAL
WBC: 15.9 10*3/uL — ABNORMAL HIGH (ref 4.0–10.5)
nRBC: 0 % (ref 0.0–0.2)

## 2021-01-11 MED ORDER — EPOETIN ALFA-EPBX 40000 UNIT/ML IJ SOLN
40000.0000 [IU] | Freq: Once | INTRAMUSCULAR | Status: AC
  Administered 2021-01-11: 40000 [IU] via SUBCUTANEOUS
  Filled 2021-01-11: qty 1

## 2021-01-11 NOTE — Progress Notes (Signed)
Patient has an increase in night sweats and fatigue.

## 2021-01-12 LAB — IGG, IGA, IGM
IgA: 72 mg/dL (ref 64–422)
IgG (Immunoglobin G), Serum: 1579 mg/dL (ref 586–1602)
IgM (Immunoglobulin M), Srm: 149 mg/dL (ref 26–217)

## 2021-01-12 LAB — KAPPA/LAMBDA LIGHT CHAINS
Kappa free light chain: 25.8 mg/L — ABNORMAL HIGH (ref 3.3–19.4)
Kappa, lambda light chain ratio: 1.62 (ref 0.26–1.65)
Lambda free light chains: 15.9 mg/L (ref 5.7–26.3)

## 2021-01-14 LAB — PROTEIN ELECTROPHORESIS, SERUM
A/G Ratio: 1.1 (ref 0.7–1.7)
Albumin ELP: 3.6 g/dL (ref 2.9–4.4)
Alpha-1-Globulin: 0.3 g/dL (ref 0.0–0.4)
Alpha-2-Globulin: 0.8 g/dL (ref 0.4–1.0)
Beta Globulin: 0.8 g/dL (ref 0.7–1.3)
Gamma Globulin: 1.4 g/dL (ref 0.4–1.8)
Globulin, Total: 3.3 g/dL (ref 2.2–3.9)
M-Spike, %: 1.1 g/dL — ABNORMAL HIGH
Total Protein ELP: 6.9 g/dL (ref 6.0–8.5)

## 2021-02-22 ENCOUNTER — Inpatient Hospital Stay: Payer: 59

## 2021-02-22 ENCOUNTER — Inpatient Hospital Stay: Payer: 59 | Attending: Oncology

## 2021-02-22 VITALS — BP 155/62 | HR 70

## 2021-02-22 DIAGNOSIS — D630 Anemia in neoplastic disease: Secondary | ICD-10-CM | POA: Diagnosis present

## 2021-02-22 DIAGNOSIS — C9310 Chronic myelomonocytic leukemia not having achieved remission: Secondary | ICD-10-CM | POA: Insufficient documentation

## 2021-02-22 DIAGNOSIS — Z79899 Other long term (current) drug therapy: Secondary | ICD-10-CM | POA: Insufficient documentation

## 2021-02-22 DIAGNOSIS — D72829 Elevated white blood cell count, unspecified: Secondary | ICD-10-CM | POA: Diagnosis not present

## 2021-02-22 DIAGNOSIS — D696 Thrombocytopenia, unspecified: Secondary | ICD-10-CM | POA: Diagnosis not present

## 2021-02-22 LAB — CBC WITH DIFFERENTIAL/PLATELET
Abs Immature Granulocytes: 0.12 10*3/uL — ABNORMAL HIGH (ref 0.00–0.07)
Basophils Absolute: 0.1 10*3/uL (ref 0.0–0.1)
Basophils Relative: 0 %
Eosinophils Absolute: 0 10*3/uL (ref 0.0–0.5)
Eosinophils Relative: 0 %
HCT: 29 % — ABNORMAL LOW (ref 36.0–46.0)
Hemoglobin: 9.5 g/dL — ABNORMAL LOW (ref 12.0–15.0)
Immature Granulocytes: 1 %
Lymphocytes Relative: 13 %
Lymphs Abs: 1.8 10*3/uL (ref 0.7–4.0)
MCH: 35.1 pg — ABNORMAL HIGH (ref 26.0–34.0)
MCHC: 32.8 g/dL (ref 30.0–36.0)
MCV: 107 fL — ABNORMAL HIGH (ref 80.0–100.0)
Monocytes Absolute: 2.6 10*3/uL — ABNORMAL HIGH (ref 0.1–1.0)
Monocytes Relative: 19 %
Neutro Abs: 9.2 10*3/uL — ABNORMAL HIGH (ref 1.7–7.7)
Neutrophils Relative %: 67 %
Platelets: 104 10*3/uL — ABNORMAL LOW (ref 150–400)
RBC: 2.71 MIL/uL — ABNORMAL LOW (ref 3.87–5.11)
RDW: 15.9 % — ABNORMAL HIGH (ref 11.5–15.5)
Smear Review: DECREASED
WBC: 13.8 10*3/uL — ABNORMAL HIGH (ref 4.0–10.5)
nRBC: 0 % (ref 0.0–0.2)

## 2021-02-22 MED ORDER — EPOETIN ALFA-EPBX 40000 UNIT/ML IJ SOLN
40000.0000 [IU] | Freq: Once | INTRAMUSCULAR | Status: AC
  Administered 2021-02-22: 40000 [IU] via SUBCUTANEOUS
  Filled 2021-02-22: qty 1

## 2021-04-04 ENCOUNTER — Inpatient Hospital Stay: Payer: 59

## 2021-04-04 ENCOUNTER — Other Ambulatory Visit: Payer: Self-pay

## 2021-04-04 ENCOUNTER — Inpatient Hospital Stay: Payer: 59 | Attending: Oncology | Admitting: Oncology

## 2021-04-04 VITALS — BP 155/80 | HR 81 | Temp 96.3°F | Wt 92.0 lb

## 2021-04-04 DIAGNOSIS — I10 Essential (primary) hypertension: Secondary | ICD-10-CM | POA: Diagnosis not present

## 2021-04-04 DIAGNOSIS — E785 Hyperlipidemia, unspecified: Secondary | ICD-10-CM | POA: Insufficient documentation

## 2021-04-04 DIAGNOSIS — R5383 Other fatigue: Secondary | ICD-10-CM | POA: Insufficient documentation

## 2021-04-04 DIAGNOSIS — I4891 Unspecified atrial fibrillation: Secondary | ICD-10-CM | POA: Diagnosis not present

## 2021-04-04 DIAGNOSIS — R5382 Chronic fatigue, unspecified: Secondary | ICD-10-CM

## 2021-04-04 DIAGNOSIS — D630 Anemia in neoplastic disease: Secondary | ICD-10-CM | POA: Diagnosis present

## 2021-04-04 DIAGNOSIS — D649 Anemia, unspecified: Secondary | ICD-10-CM

## 2021-04-04 DIAGNOSIS — C9310 Chronic myelomonocytic leukemia not having achieved remission: Secondary | ICD-10-CM

## 2021-04-04 DIAGNOSIS — K219 Gastro-esophageal reflux disease without esophagitis: Secondary | ICD-10-CM | POA: Diagnosis not present

## 2021-04-04 DIAGNOSIS — I350 Nonrheumatic aortic (valve) stenosis: Secondary | ICD-10-CM | POA: Insufficient documentation

## 2021-04-04 DIAGNOSIS — D696 Thrombocytopenia, unspecified: Secondary | ICD-10-CM | POA: Insufficient documentation

## 2021-04-04 LAB — COMPREHENSIVE METABOLIC PANEL
ALT: 25 U/L (ref 0–44)
AST: 29 U/L (ref 15–41)
Albumin: 4.1 g/dL (ref 3.5–5.0)
Alkaline Phosphatase: 55 U/L (ref 38–126)
Anion gap: 7 (ref 5–15)
BUN: 18 mg/dL (ref 8–23)
CO2: 29 mmol/L (ref 22–32)
Calcium: 9.1 mg/dL (ref 8.9–10.3)
Chloride: 102 mmol/L (ref 98–111)
Creatinine, Ser: 0.68 mg/dL (ref 0.44–1.00)
GFR, Estimated: >60 mL/min (ref >60)
Glucose, Bld: 95 mg/dL (ref 70–99)
Potassium: 4 mmol/L (ref 3.5–5.1)
Sodium: 138 mmol/L (ref 135–145)
Total Bilirubin: 0.6 mg/dL (ref 0.3–1.2)
Total Protein: 7.9 g/dL (ref 6.5–8.1)

## 2021-04-04 LAB — CBC WITH DIFFERENTIAL/PLATELET
Abs Immature Granulocytes: 0.15 10*3/uL — ABNORMAL HIGH (ref 0.00–0.07)
Basophils Absolute: 0.1 10*3/uL (ref 0.0–0.1)
Basophils Relative: 0 %
Eosinophils Absolute: 0 10*3/uL (ref 0.0–0.5)
Eosinophils Relative: 0 %
HCT: 29 % — ABNORMAL LOW (ref 36.0–46.0)
Hemoglobin: 9.6 g/dL — ABNORMAL LOW (ref 12.0–15.0)
Immature Granulocytes: 1 %
Lymphocytes Relative: 13 %
Lymphs Abs: 1.9 10*3/uL (ref 0.7–4.0)
MCH: 34.9 pg — ABNORMAL HIGH (ref 26.0–34.0)
MCHC: 33.1 g/dL (ref 30.0–36.0)
MCV: 105.5 fL — ABNORMAL HIGH (ref 80.0–100.0)
Monocytes Absolute: 2.6 10*3/uL — ABNORMAL HIGH (ref 0.1–1.0)
Monocytes Relative: 19 %
Neutro Abs: 9.3 10*3/uL — ABNORMAL HIGH (ref 1.7–7.7)
Neutrophils Relative %: 67 %
Platelets: 101 10*3/uL — ABNORMAL LOW (ref 150–400)
RBC: 2.75 MIL/uL — ABNORMAL LOW (ref 3.87–5.11)
RDW: 15.5 % (ref 11.5–15.5)
WBC: 14 10*3/uL — ABNORMAL HIGH (ref 4.0–10.5)
nRBC: 0 % (ref 0.0–0.2)

## 2021-04-04 MED ORDER — EPOETIN ALFA-EPBX 40000 UNIT/ML IJ SOLN
40000.0000 [IU] | Freq: Once | INTRAMUSCULAR | Status: AC
  Administered 2021-04-04: 40000 [IU] via SUBCUTANEOUS
  Filled 2021-04-04: qty 1

## 2021-04-04 NOTE — Progress Notes (Signed)
Vickie Howard  Telephone:(336) [phone] Fax:(336) [phone]  ID: Demetrio Lapping OB: 1935/06/07  MR#: [id number]  XKP#:[id number]  Patient Care Team: Adin Hector, MD as PCP - General (Internal Medicine)  CHIEF COMPLAINT: CMML  INTERVAL HISTORY: Mrs. Vickie Howard is an 85 year old female with past medical history significant for atrial fibrillation, hypertension, aortic stenosis, GERD, osteoporosis with several fractures, renal insufficiency, thrombocytopenia, hyperlipidemia and anemia who is followed by Dr. Grayland Ormond.  She receives monthly Retacrit for hemoglobin less than 10.  This was last given on 02/22/2021.  In the interim, she denies any recent hospitalizations.  She has chronic weakness and fatigue along with poor memory.  She denies any falls.  She lives alone with family close by.  She has some occasional shortness of breath with exertion.  She is hoping to live independently for as long as possible.  She continues to cook at home and eats 3 meals per day.  Patient's daughter states she takes frequent naps.  REVIEW OF SYSTEMS:   Review of Systems  Constitutional:  Positive for malaise/fatigue and weight loss. Negative for chills and fever.  HENT:  Negative for congestion, ear pain and tinnitus.   Eyes: Negative.  Negative for blurred vision and double vision.  Respiratory:  Positive for shortness of breath. Negative for cough and sputum production.   Cardiovascular: Negative.  Negative for chest pain, palpitations and leg swelling.  Gastrointestinal: Negative.  Negative for abdominal pain, constipation, diarrhea, nausea and vomiting.  Genitourinary:  Negative for dysuria, frequency and urgency.  Musculoskeletal:  Negative for back pain and falls.  Skin: Negative.  Negative for rash.  Neurological: Negative.  Negative for weakness and headaches.  Endo/Heme/Allergies: Negative.  Does not bruise/bleed easily.  Psychiatric/Behavioral: Negative.  Negative for  depression. The patient is not nervous/anxious and does not have insomnia.    As per HPI. Otherwise, a complete review of systems is negative.  PAST MEDICAL HISTORY: Past Medical History:  Diagnosis Date   Anemia    Arthritis    Atrial fibrillation (HCC)    GERD (gastroesophageal reflux disease)    Heart murmur    History of hiatal hernia    HLD (hyperlipidemia)    HTN (hypertension)    Occasional tremors    hands    PAST SURGICAL HISTORY: Past Surgical History:  Procedure Laterality Date   JOINT REPLACEMENT     right total hip 2015   Left total hip arthroplasty  10/12/2014   PACEMAKER INSERTION N/A 08/12/2015   Procedure: INSERTION PACEMAKER attempt unable to obtain access ;  Surgeon: Isaias Cowman, MD;  Location: [facility] ORS;  Service: Cardiovascular;  Laterality: N/A;   TOTAL HIP ARTHROPLASTY Left 10/12/2014   Procedure: TOTAL HIP ARTHROPLASTY;  Surgeon: Dereck Leep, MD;  Location: [facility] OR[facility];  Service: Orthopedics;  Laterality: Left;   TUBAL LIGATION      FAMILY HISTORY: Family History  Problem Relation Age of Onset   Aneurysm Father    Breast cancer Daughter     ADVANCED DIRECTIVES (Y/N):  N  HEALTH MAINTENANCE: Social History   Tobacco Use   Smoking status: Never   Smokeless tobacco: Never  Vaping Use   Vaping Use: Never used  Substance Use Topics   Alcohol use: No   Drug use: No     Colonoscopy:  PAP:  Bone density:  Lipid panel:  Allergies  Allergen Reactions   Codeine Sulfate Other (See Comments)    Reaction:  Syncope   Levaquin [Levofloxacin] Nausea  And Vomiting   Oxycodone Other (See Comments)    Reaction:  Disoriented     Current Outpatient Medications  Medication Sig Dispense Refill   acetaminophen (TYLENOL) 500 MG tablet Take 1,000 mg by mouth every 6 (six) hours as needed for mild pain or headache.     aspirin EC 81 MG tablet Take 81 mg by mouth every other day.      atorvastatin (LIPITOR) 20 MG tablet Take 20 mg by mouth daily.      Cyanocobalamin (VITAMIN B 12 PO) Take 1 mcg by mouth daily.     docusate sodium (COLACE) 100 MG capsule Take 100 mg by mouth 2 (two) times daily.     donepezil (ARICEPT) 5 MG tablet Take 5 mg by mouth daily.     feeding supplement, ENSURE ENLIVE, (ENSURE ENLIVE) LIQD Take 237 mLs by mouth 2 (two) times daily between meals. 237 mL 12   ferrous sulfate 325 (65 FE) MG tablet Take 325 mg by mouth daily with breakfast.     LANOXIN 62.5 MCG TABS TK 1 T PO ONCE D  11   lisinopril (PRINIVIL,ZESTRIL) 2.5 MG tablet Take 2.5 mg by mouth daily.   1   mirtazapine (REMERON) 15 MG tablet Take 1 tablet by mouth 1 day or 1 dose.     Multiple Vitamin (MULTI-VITAMIN) tablet Take 1 tablet by mouth 1 day or 1 dose.     Multiple Vitamins-Minerals (PRESERVISION AREDS 2) CAPS Take 1 capsule by mouth daily.     omeprazole (PRILOSEC) 20 MG capsule Take 20 mg by mouth daily.     pramipexole (MIRAPEX) 0.125 MG tablet Take 0.125 mg by mouth 2 (two) times daily.     sertraline (ZOLOFT) 50 MG tablet Take 75 mg by mouth daily. Patient taking 45m daily per Dr. KJens Som    No current facility-administered medications for this visit.    OBJECTIVE: Vitals:   04/04/21 1104  BP: (!) 155/80  Pulse: 81  Temp: (!) 96.3 F (35.7 C)     Body mass index is 18.58 kg/m.    ECOG FS:0 - Asymptomatic  Physical Exam Constitutional:      Appearance: Normal appearance.  HENT:     Head: Normocephalic and atraumatic.  Eyes:     Pupils: Pupils are equal, round, and reactive to light.  Cardiovascular:     Rate and Rhythm: Normal rate and regular rhythm.     Heart sounds: Normal heart sounds. No murmur heard. Pulmonary:     Effort: Pulmonary effort is normal.     Breath sounds: Normal breath sounds. No wheezing.  Abdominal:     General: Bowel sounds are normal. There is no distension.     Palpations: Abdomen is soft.     Tenderness: There is no abdominal tenderness.  Musculoskeletal:        General: Normal range of motion.      Cervical back: Normal range of motion.  Skin:    General: Skin is warm and dry.     Findings: No rash.  Neurological:     Mental Status: She is alert and oriented to person, place, and time.  Psychiatric:        Judgment: Judgment normal.       LAB RESULTS:  Lab Results  Component Value Date   NA 139 01/11/2021   K 3.7 01/11/2021   CL 103 01/11/2021   CO2 30 01/11/2021   GLUCOSE 100 (H) 01/11/2021   BUN 19 01/11/2021  CREATININE 0.58 01/11/2021   CALCIUM 9.1 01/11/2021   PROT 7.7 01/11/2021   ALBUMIN 4.1 01/11/2021   AST 27 01/11/2021   ALT 19 01/11/2021   ALKPHOS 56 01/11/2021   BILITOT 0.3 01/11/2021   GFRNONAA >60 01/11/2021   GFRAA >60 02/25/2020    Lab Results  Component Value Date   WBC 14.0 (H) 04/04/2021   NEUTROABS PENDING 04/04/2021   HGB 9.6 (L) 04/04/2021   HCT 29.0 (L) 04/04/2021   MCV 105.5 (H) 04/04/2021   PLT 101 (L) 04/04/2021     STUDIES: No results found.  ASSESSMENT: CMML.  PLAN:   1. CMML:  Confirmed by bone marrow biopsy on 06/18/2018 revealing hypercellular bone marrow with mild dysplasia with no circulating blast.  Labs from today show white blood cell count of 14.0, hemoglobin 9.6 and platelet count of 101,000.  Given her age and performance status treatment has not been started at this time.  Continue supportive care with Retacrit every 6 weeks as needed.  2.  Anemia:  Secondary to CMML.  Labs from today show hemoglobin of 9.6.  Proceed with Retacrit.  Return to clinic in 6 weeks for labs only plus or minus Retacrit in 12 weeks for further evaluation, see Dr. Grayland Ormond and possible Retacrit.  3.  Elevated MCV:  Secondary to CMML.  B12 and folate have been within normal range.    3.  Thrombocytopenia:  Chronic and unchanged.  Platelet count today is 101,000.  4.  Leukocytosis: Labs from today show white blood cell count of 14.0.  Previously 13.8.  No active infection.  5. Fatigue-frequent naps.  Daughter is concerned this  may be increasing.  We did discuss that between patient age, comorbidities and significant anemia this is likely all contributing.  Last B12 level was from 2019 and she has not had recent iron levels drawn.  She is taking a multivitamin with both iron and B12.  We will recheck at next lab draw.  Disposition: Proceed with Retacrit today.  Return to clinic in 6 weeks for labs CBC, CMP and possible Retacrit and in 12 weeks for labs CBC, CMP, myeloma labs and see Dr. Grayland Ormond with possible Retacrit.  Patient expressed understanding and was in agreement with this plan. She also understands that She can call clinic at any time with any questions, concerns, or complaints.   I spent 25 minutes dedicated to the care of this patient (face-to-face and non-face-to-face) on the date of the encounter to include what is described in the assessment and plan.   Jacquelin Hawking, NP   04/04/2021 11:11 AM

## 2021-04-05 ENCOUNTER — Ambulatory Visit: Payer: 59

## 2021-04-05 ENCOUNTER — Other Ambulatory Visit: Payer: 59

## 2021-04-05 ENCOUNTER — Ambulatory Visit: Payer: 59 | Admitting: Oncology

## 2021-04-05 LAB — IGG, IGA, IGM
IgA: 85 mg/dL (ref 64–422)
IgG (Immunoglobin G), Serum: 1680 mg/dL — ABNORMAL HIGH (ref 586–1602)
IgM (Immunoglobulin M), Srm: 158 mg/dL (ref 26–217)

## 2021-04-05 LAB — KAPPA/LAMBDA LIGHT CHAINS
Kappa free light chain: 24.4 mg/L — ABNORMAL HIGH (ref 3.3–19.4)
Kappa, lambda light chain ratio: 1.83 — ABNORMAL HIGH (ref 0.26–1.65)
Lambda free light chains: 13.3 mg/L (ref 5.7–26.3)

## 2021-04-06 LAB — PROTEIN ELECTROPHORESIS, SERUM
A/G Ratio: 1.2 (ref 0.7–1.7)
Albumin ELP: 3.9 g/dL (ref 2.9–4.4)
Alpha-1-Globulin: 0.3 g/dL (ref 0.0–0.4)
Alpha-2-Globulin: 0.7 g/dL (ref 0.4–1.0)
Beta Globulin: 0.8 g/dL (ref 0.7–1.3)
Gamma Globulin: 1.5 g/dL (ref 0.4–1.8)
Globulin, Total: 3.2 g/dL (ref 2.2–3.9)
M-Spike, %: 1.1 g/dL — ABNORMAL HIGH
Total Protein ELP: 7.1 g/dL (ref 6.0–8.5)

## 2021-04-19 ENCOUNTER — Inpatient Hospital Stay: Payer: 59 | Attending: Oncology

## 2021-04-19 ENCOUNTER — Ambulatory Visit: Payer: 59 | Admitting: Oncology

## 2021-04-19 ENCOUNTER — Inpatient Hospital Stay: Payer: 59

## 2021-04-19 ENCOUNTER — Other Ambulatory Visit: Payer: Self-pay

## 2021-04-19 VITALS — BP 156/79 | HR 78

## 2021-04-19 DIAGNOSIS — D649 Anemia, unspecified: Secondary | ICD-10-CM

## 2021-04-19 DIAGNOSIS — C9310 Chronic myelomonocytic leukemia not having achieved remission: Secondary | ICD-10-CM | POA: Insufficient documentation

## 2021-04-19 LAB — COMPREHENSIVE METABOLIC PANEL
ALT: 22 U/L (ref 0–44)
AST: 32 U/L (ref 15–41)
Albumin: 3.9 g/dL (ref 3.5–5.0)
Alkaline Phosphatase: 54 U/L (ref 38–126)
Anion gap: 9 (ref 5–15)
BUN: 26 mg/dL — ABNORMAL HIGH (ref 8–23)
CO2: 27 mmol/L (ref 22–32)
Calcium: 9.1 mg/dL (ref 8.9–10.3)
Chloride: 102 mmol/L (ref 98–111)
Creatinine, Ser: 0.75 mg/dL (ref 0.44–1.00)
GFR, Estimated: >60 mL/min (ref >60)
Glucose, Bld: 103 mg/dL — ABNORMAL HIGH (ref 70–99)
Potassium: 4 mmol/L (ref 3.5–5.1)
Sodium: 138 mmol/L (ref 135–145)
Total Bilirubin: 0.5 mg/dL (ref 0.3–1.2)
Total Protein: 7.5 g/dL (ref 6.5–8.1)

## 2021-04-19 LAB — CBC WITH DIFFERENTIAL/PLATELET
Abs Immature Granulocytes: 0.14 10*3/uL — ABNORMAL HIGH (ref 0.00–0.07)
Basophils Absolute: 0 10*3/uL (ref 0.0–0.1)
Basophils Relative: 0 %
Eosinophils Absolute: 0 10*3/uL (ref 0.0–0.5)
Eosinophils Relative: 0 %
HCT: 31.7 % — ABNORMAL LOW (ref 36.0–46.0)
Hemoglobin: 10.3 g/dL — ABNORMAL LOW (ref 12.0–15.0)
Immature Granulocytes: 1 %
Lymphocytes Relative: 10 %
Lymphs Abs: 1.4 10*3/uL (ref 0.7–4.0)
MCH: 35.3 pg — ABNORMAL HIGH (ref 26.0–34.0)
MCHC: 32.5 g/dL (ref 30.0–36.0)
MCV: 108.6 fL — ABNORMAL HIGH (ref 80.0–100.0)
Monocytes Absolute: 2.6 10*3/uL — ABNORMAL HIGH (ref 0.1–1.0)
Monocytes Relative: 20 %
Neutro Abs: 9 10*3/uL — ABNORMAL HIGH (ref 1.7–7.7)
Neutrophils Relative %: 69 %
Platelets: 89 10*3/uL — ABNORMAL LOW (ref 150–400)
RBC: 2.92 MIL/uL — ABNORMAL LOW (ref 3.87–5.11)
RDW: 16.6 % — ABNORMAL HIGH (ref 11.5–15.5)
WBC: 13.2 10*3/uL — ABNORMAL HIGH (ref 4.0–10.5)
nRBC: 0 % (ref 0.0–0.2)

## 2021-04-19 MED ORDER — EPOETIN ALFA-EPBX 40000 UNIT/ML IJ SOLN: 40000.0000 [IU] | Freq: Once | INTRAMUSCULAR | Status: DC

## 2021-05-12 ENCOUNTER — Other Ambulatory Visit: Payer: Self-pay | Admitting: *Deleted

## 2021-05-12 DIAGNOSIS — C9310 Chronic myelomonocytic leukemia not having achieved remission: Secondary | ICD-10-CM

## 2021-05-16 ENCOUNTER — Other Ambulatory Visit: Payer: 59

## 2021-05-16 ENCOUNTER — Ambulatory Visit: Payer: 59

## 2021-05-16 NOTE — Progress Notes (Signed)
[place]  Telephone:(336) [phone] Fax:(336) [phone]  ID: Demetrio Lapping OB: February 03, 1936  MR#: [id number]  VZS#:[id number]  Patient Care Team: Adin Hector, MD as PCP - General (Internal Medicine)  CHIEF COMPLAINT: CMML  INTERVAL HISTORY: Patient returns to clinic today for repeat laboratory work, further evaluation, and continuation of Retacrit.  She continues to have chronic weakness and fatigue and feels at her baseline, but her daughter feels her energy levels have decreased.  She otherwise feels well.  She has no neurologic complaints. She denies any recent fevers or illnesses. She has a good appetite and denies weight loss. She denies any chest pain, shortness of breath, cough, or hemoptysis. She denies any nausea, vomiting, constipation, or diarrhea.  She has no melena or hematochezia.  She has no urinary complaints.  Patient offers no further specific complaints today.  REVIEW OF SYSTEMS:   Review of Systems  Constitutional:  Positive for malaise/fatigue. Negative for fever and weight loss.  Respiratory: Negative.  Negative for cough, hemoptysis and shortness of breath.   Cardiovascular: Negative.  Negative for chest pain and leg swelling.  Gastrointestinal: Negative.  Negative for abdominal pain, blood in stool and melena.  Genitourinary: Negative.  Negative for hematuria.  Musculoskeletal: Negative.  Negative for back pain, falls and joint pain.  Skin: Negative.  Negative for rash.  Neurological:  Positive for weakness. Negative for dizziness, sensory change, focal weakness and headaches.  Psychiatric/Behavioral:  Positive for memory loss. The patient is not nervous/anxious.    As per HPI. Otherwise, a complete review of systems is negative.  PAST MEDICAL HISTORY: Past Medical History:  Diagnosis Date   Anemia    Arthritis    Atrial fibrillation (HCC)    GERD (gastroesophageal reflux disease)    Heart murmur    History of hiatal hernia     HLD (hyperlipidemia)    HTN (hypertension)    Occasional tremors    hands    PAST SURGICAL HISTORY: Past Surgical History:  Procedure Laterality Date   JOINT REPLACEMENT     right total hip 2015   Left total hip arthroplasty  10/12/2014   PACEMAKER INSERTION N/A 08/12/2015   Procedure: INSERTION PACEMAKER attempt unable to obtain access ;  Surgeon: Isaias Cowman, MD;  Location: [facility] OR[facility];  Service: Cardiovascular;  Laterality: N/A;   TOTAL HIP ARTHROPLASTY Left 10/12/2014   Procedure: TOTAL HIP ARTHROPLASTY;  Surgeon: Dereck Leep, MD;  Location: [facility] OR[facility];  Service: Orthopedics;  Laterality: Left;   TUBAL LIGATION      FAMILY HISTORY: Family History  Problem Relation Age of Onset   Aneurysm Father    Breast cancer Daughter     ADVANCED DIRECTIVES (Y/N):  N  HEALTH MAINTENANCE: Social History   Tobacco Use   Smoking status: Never   Smokeless tobacco: Never  Vaping Use   Vaping Use: Never used  Substance Use Topics   Alcohol use: No   Drug use: No     Colonoscopy:  PAP:  Bone density:  Lipid panel:  Allergies  Allergen Reactions   Codeine Sulfate Other (See Comments)    Reaction:  Syncope   Levaquin [Levofloxacin] Nausea And Vomiting   Oxycodone Other (See Comments)    Reaction:  Disoriented     Current Outpatient Medications  Medication Sig Dispense Refill   acetaminophen (TYLENOL) 500 MG tablet Take 1,000 mg by mouth every 6 (six) hours as needed for mild pain or headache.     aspirin EC  81 MG tablet Take 81 mg by mouth every other day.      atorvastatin (LIPITOR) 20 MG tablet Take 20 mg by mouth daily.     Cyanocobalamin (VITAMIN B 12 PO) Take 1 mcg by mouth daily.     docusate sodium (COLACE) 100 MG capsule Take 100 mg by mouth 2 (two) times daily.     donepezil (ARICEPT) 5 MG tablet Take 5 mg by mouth daily.     feeding supplement, ENSURE ENLIVE, (ENSURE ENLIVE) LIQD Take 237 mLs by mouth 2 (two) times daily between meals. 237 mL 12   ferrous  sulfate 325 (65 FE) MG tablet Take 325 mg by mouth daily with breakfast.     LANOXIN 62.5 MCG TABS TK 1 T PO ONCE D  11   lisinopril (PRINIVIL,ZESTRIL) 2.5 MG tablet Take 2.5 mg by mouth daily.   1   mirtazapine (REMERON) 15 MG tablet Take 1 tablet by mouth 1 day or 1 dose.     Multiple Vitamin (MULTI-VITAMIN) tablet Take 1 tablet by mouth 1 day or 1 dose.     Multiple Vitamins-Minerals (PRESERVISION AREDS 2) CAPS Take 1 capsule by mouth daily.     omeprazole (PRILOSEC) 20 MG capsule Take 20 mg by mouth daily.     pramipexole (MIRAPEX) 0.125 MG tablet Take 0.125 mg by mouth 2 (two) times daily.     sertraline (ZOLOFT) 50 MG tablet Take 75 mg by mouth daily. Patient taking $RemoveBefore'75mg'[id number]$  daily per Dr. Jens Som     No current facility-administered medications for this visit.    OBJECTIVE: Vitals:   05/17/21 1050  BP: (!) 155/56  Pulse: 66  Resp: 18  Temp: 98.3 F (36.8 C)  SpO2: 100%     Body mass index is 18.38 kg/m.    ECOG FS:0 - Asymptomatic  General: Well-developed, well-nourished, no acute distress. Eyes: Pink conjunctiva, anicteric sclera. HEENT: Normocephalic, moist mucous membranes. Lungs: No audible wheezing or coughing. Heart: Regular rate and rhythm. Abdomen: Soft, nontender, no obvious distention. Musculoskeletal: No edema, cyanosis, or clubbing. Neuro: Alert, answering all questions appropriately. Cranial nerves grossly intact. Skin: No rashes or petechiae noted. Psych: Normal affect.   LAB RESULTS:  Lab Results  Component Value Date   NA 138 04/19/2021   K 4.0 04/19/2021   CL 102 04/19/2021   CO2 27 04/19/2021   GLUCOSE 103 (H) 04/19/2021   BUN 26 (H) 04/19/2021   CREATININE 0.75 04/19/2021   CALCIUM 9.1 04/19/2021   PROT 7.5 04/19/2021   ALBUMIN 3.9 04/19/2021   AST 32 04/19/2021   ALT 22 04/19/2021   ALKPHOS 54 04/19/2021   BILITOT 0.5 04/19/2021   GFRNONAA >60 04/19/2021   GFRAA >60 02/25/2020    Lab Results  Component Value Date   WBC 14.8 (H)  05/17/2021   NEUTROABS 10.2 (H) 05/17/2021   HGB 10.0 (L) 05/17/2021   HCT 31.1 (L) 05/17/2021   MCV 106.5 (H) 05/17/2021   PLT 105 (L) 05/17/2021     STUDIES: No results found.  ASSESSMENT: CMML.  PLAN:   1. CMML: Confirmed on bone marrow biopsy from June 18, 2018.  Patient noted to have a hypercellular marrow with mild dysplasia in all 3 lineages.  No increased blasts.  Patient's white blood cell count remains elevated, but approximately her baseline at 14.8.  She also has a chronically elevated absolute monocytosis level of 2.7.  Given her advanced age and she is asymptomatic, she does not require treatment at this time.  Can consider Vidaza or Dacogen in the future if absolutely necessary, but patient has indicated she would likely decline treatment.  Proceed with Retacrit today.  Continue supportive care with Retacrit if hemoglobin falls below 10.0. 2.  Anemia: Possibly secondary to CMML.  Hemoglobin is 10.0 today.  Given patient's persistent weakness and fatigue we will proceed with Retacrit as scheduled.  Return to clinic in 1 and 2 months for laboratory work and Retacrit only.  Patient will then return to clinic in 3 months for laboratory work, further evaluation, and continuation of Retacrit if needed.   3.  Elevated MCV: Chronic and unchanged.  B12 and folate are within normal limits.  Secondary to CMML.   3.  Thrombocytopenia: Chronic and unchanged.  Patient's platelet count was 105 today.   4.  Leukocytosis: Chronic and unchanged.  Secondary to CMML.   Patient expressed understanding and was in agreement with this plan. She also understands that She can call clinic at any time with any questions, concerns, or complaints.    Lloyd Huger, MD   05/18/2021 6:37 AM

## 2021-05-17 ENCOUNTER — Inpatient Hospital Stay: Payer: 59

## 2021-05-17 ENCOUNTER — Other Ambulatory Visit: Payer: Self-pay

## 2021-05-17 ENCOUNTER — Inpatient Hospital Stay (HOSPITAL_BASED_OUTPATIENT_CLINIC_OR_DEPARTMENT_OTHER): Payer: 59 | Admitting: Oncology

## 2021-05-17 ENCOUNTER — Inpatient Hospital Stay: Payer: 59 | Attending: Oncology

## 2021-05-17 VITALS — BP 155/56 | HR 66 | Temp 98.3°F | Resp 18 | Wt 91.0 lb

## 2021-05-17 DIAGNOSIS — C9310 Chronic myelomonocytic leukemia not having achieved remission: Secondary | ICD-10-CM | POA: Insufficient documentation

## 2021-05-17 DIAGNOSIS — D630 Anemia in neoplastic disease: Secondary | ICD-10-CM | POA: Insufficient documentation

## 2021-05-17 DIAGNOSIS — R5382 Chronic fatigue, unspecified: Secondary | ICD-10-CM

## 2021-05-17 DIAGNOSIS — D649 Anemia, unspecified: Secondary | ICD-10-CM

## 2021-05-17 LAB — CBC WITH DIFFERENTIAL/PLATELET
Abs Immature Granulocytes: 0.16 10*3/uL — ABNORMAL HIGH (ref 0.00–0.07)
Basophils Absolute: 0.1 10*3/uL (ref 0.0–0.1)
Basophils Relative: 1 %
Eosinophils Absolute: 0 10*3/uL (ref 0.0–0.5)
Eosinophils Relative: 0 %
HCT: 31.1 % — ABNORMAL LOW (ref 36.0–46.0)
Hemoglobin: 10 g/dL — ABNORMAL LOW (ref 12.0–15.0)
Immature Granulocytes: 1 %
Lymphocytes Relative: 12 %
Lymphs Abs: 1.7 10*3/uL (ref 0.7–4.0)
MCH: 34.2 pg — ABNORMAL HIGH (ref 26.0–34.0)
MCHC: 32.2 g/dL (ref 30.0–36.0)
MCV: 106.5 fL — ABNORMAL HIGH (ref 80.0–100.0)
Monocytes Absolute: 2.7 10*3/uL — ABNORMAL HIGH (ref 0.1–1.0)
Monocytes Relative: 18 %
Neutro Abs: 10.2 10*3/uL — ABNORMAL HIGH (ref 1.7–7.7)
Neutrophils Relative %: 68 %
Platelets: 105 10*3/uL — ABNORMAL LOW (ref 150–400)
RBC: 2.92 MIL/uL — ABNORMAL LOW (ref 3.87–5.11)
RDW: 15.5 % (ref 11.5–15.5)
Smear Review: DECREASED
WBC: 14.8 10*3/uL — ABNORMAL HIGH (ref 4.0–10.5)
nRBC: 0 % (ref 0.0–0.2)

## 2021-05-17 LAB — FOLATE: Folate: 68 ng/mL (ref >5.9)

## 2021-05-17 LAB — IRON AND TIBC
Iron: 115 ug/dL (ref 28–170)
Saturation Ratios: 44 % — ABNORMAL HIGH (ref 10.4–31.8)
TIBC: 259 ug/dL (ref 250–450)
UIBC: 144 ug/dL

## 2021-05-17 LAB — FERRITIN: Ferritin: 329 ng/mL — ABNORMAL HIGH (ref 11–307)

## 2021-05-17 LAB — VITAMIN B12: Vitamin B-12: 2092 pg/mL — ABNORMAL HIGH (ref 180–914)

## 2021-05-17 MED ORDER — EPOETIN ALFA-EPBX 40000 UNIT/ML IJ SOLN
40000.0000 [IU] | Freq: Once | INTRAMUSCULAR | Status: AC
  Administered 2021-05-17: 40000 [IU] via SUBCUTANEOUS
  Filled 2021-05-17: qty 1

## 2021-05-17 NOTE — Progress Notes (Signed)
Patient reports having severe night sweat and during the day, hot flashes. Trouble sleeping, SOB, some decrease in appetite.

## 2021-05-18 ENCOUNTER — Encounter: Payer: Self-pay | Admitting: Oncology

## 2021-05-19 LAB — COPPER, SERUM: Copper: 112 ug/dL (ref 80–158)

## 2021-06-08 ENCOUNTER — Other Ambulatory Visit: Payer: Self-pay | Admitting: *Deleted

## 2021-06-08 DIAGNOSIS — D649 Anemia, unspecified: Secondary | ICD-10-CM

## 2021-06-08 DIAGNOSIS — C9310 Chronic myelomonocytic leukemia not having achieved remission: Secondary | ICD-10-CM

## 2021-06-14 ENCOUNTER — Inpatient Hospital Stay: Payer: 59

## 2021-06-14 ENCOUNTER — Inpatient Hospital Stay: Payer: 59 | Attending: Oncology

## 2021-06-14 ENCOUNTER — Other Ambulatory Visit: Payer: Self-pay

## 2021-06-14 DIAGNOSIS — D649 Anemia, unspecified: Secondary | ICD-10-CM

## 2021-06-14 DIAGNOSIS — C9310 Chronic myelomonocytic leukemia not having achieved remission: Secondary | ICD-10-CM | POA: Diagnosis not present

## 2021-06-14 LAB — CBC WITH DIFFERENTIAL/PLATELET
Abs Immature Granulocytes: 0.27 10*3/uL — ABNORMAL HIGH (ref 0.00–0.07)
Basophils Absolute: 0.1 10*3/uL (ref 0.0–0.1)
Basophils Relative: 0 %
Eosinophils Absolute: 0 10*3/uL (ref 0.0–0.5)
Eosinophils Relative: 0 %
HCT: 31.4 % — ABNORMAL LOW (ref 36.0–46.0)
Hemoglobin: 10.2 g/dL — ABNORMAL LOW (ref 12.0–15.0)
Immature Granulocytes: 2 %
Lymphocytes Relative: 11 %
Lymphs Abs: 1.7 10*3/uL (ref 0.7–4.0)
MCH: 34.5 pg — ABNORMAL HIGH (ref 26.0–34.0)
MCHC: 32.5 g/dL (ref 30.0–36.0)
MCV: 106.1 fL — ABNORMAL HIGH (ref 80.0–100.0)
Monocytes Absolute: 3 10*3/uL — ABNORMAL HIGH (ref 0.1–1.0)
Monocytes Relative: 19 %
Neutro Abs: 11 10*3/uL — ABNORMAL HIGH (ref 1.7–7.7)
Neutrophils Relative %: 68 %
Platelets: 115 10*3/uL — ABNORMAL LOW (ref 150–400)
RBC: 2.96 MIL/uL — ABNORMAL LOW (ref 3.87–5.11)
RDW: 15.7 % — ABNORMAL HIGH (ref 11.5–15.5)
Smear Review: NORMAL
WBC: 16 10*3/uL — ABNORMAL HIGH (ref 4.0–10.5)
nRBC: 0 % (ref 0.0–0.2)

## 2021-07-12 ENCOUNTER — Inpatient Hospital Stay: Payer: 59

## 2021-07-12 ENCOUNTER — Other Ambulatory Visit: Payer: Self-pay

## 2021-07-12 ENCOUNTER — Inpatient Hospital Stay: Payer: 59 | Attending: Oncology

## 2021-07-12 VITALS — BP 149/64 | HR 82

## 2021-07-12 DIAGNOSIS — C9310 Chronic myelomonocytic leukemia not having achieved remission: Secondary | ICD-10-CM

## 2021-07-12 DIAGNOSIS — D630 Anemia in neoplastic disease: Secondary | ICD-10-CM | POA: Insufficient documentation

## 2021-07-12 DIAGNOSIS — D649 Anemia, unspecified: Secondary | ICD-10-CM

## 2021-07-12 LAB — CBC WITH DIFFERENTIAL/PLATELET
Abs Immature Granulocytes: 0.22 10*3/uL — ABNORMAL HIGH (ref 0.00–0.07)
Basophils Absolute: 0.1 10*3/uL (ref 0.0–0.1)
Basophils Relative: 1 %
Eosinophils Absolute: 0 10*3/uL (ref 0.0–0.5)
Eosinophils Relative: 0 %
HCT: 29.4 % — ABNORMAL LOW (ref 36.0–46.0)
Hemoglobin: 9.6 g/dL — ABNORMAL LOW (ref 12.0–15.0)
Immature Granulocytes: 1 %
Lymphocytes Relative: 11 %
Lymphs Abs: 1.9 10*3/uL (ref 0.7–4.0)
MCH: 34.7 pg — ABNORMAL HIGH (ref 26.0–34.0)
MCHC: 32.7 g/dL (ref 30.0–36.0)
MCV: 106.1 fL — ABNORMAL HIGH (ref 80.0–100.0)
Monocytes Absolute: 2.8 10*3/uL — ABNORMAL HIGH (ref 0.1–1.0)
Monocytes Relative: 17 %
Neutro Abs: 11.8 10*3/uL — ABNORMAL HIGH (ref 1.7–7.7)
Neutrophils Relative %: 70 %
Platelets: 105 10*3/uL — ABNORMAL LOW (ref 150–400)
RBC: 2.77 MIL/uL — ABNORMAL LOW (ref 3.87–5.11)
RDW: 15.5 % (ref 11.5–15.5)
Smear Review: NORMAL
WBC: 16.9 10*3/uL — ABNORMAL HIGH (ref 4.0–10.5)
nRBC: 0 % (ref 0.0–0.2)

## 2021-07-12 MED ORDER — EPOETIN ALFA-EPBX 40000 UNIT/ML IJ SOLN
40000.0000 [IU] | Freq: Once | INTRAMUSCULAR | Status: AC
  Administered 2021-07-12: 40000 [IU] via SUBCUTANEOUS
  Filled 2021-07-12: qty 1

## 2021-08-09 ENCOUNTER — Inpatient Hospital Stay (HOSPITAL_BASED_OUTPATIENT_CLINIC_OR_DEPARTMENT_OTHER): Payer: 59 | Admitting: Nurse Practitioner

## 2021-08-09 ENCOUNTER — Other Ambulatory Visit: Payer: Self-pay

## 2021-08-09 ENCOUNTER — Inpatient Hospital Stay: Payer: 59

## 2021-08-09 ENCOUNTER — Inpatient Hospital Stay: Payer: 59 | Attending: Nurse Practitioner

## 2021-08-09 ENCOUNTER — Other Ambulatory Visit: Payer: Self-pay | Admitting: *Deleted

## 2021-08-09 ENCOUNTER — Encounter: Payer: Self-pay | Admitting: Nurse Practitioner

## 2021-08-09 VITALS — BP 145/82 | HR 65 | Temp 98.5°F | Wt 92.3 lb

## 2021-08-09 DIAGNOSIS — C9310 Chronic myelomonocytic leukemia not having achieved remission: Secondary | ICD-10-CM

## 2021-08-09 DIAGNOSIS — D630 Anemia in neoplastic disease: Secondary | ICD-10-CM | POA: Insufficient documentation

## 2021-08-09 DIAGNOSIS — D649 Anemia, unspecified: Secondary | ICD-10-CM

## 2021-08-09 DIAGNOSIS — R61 Generalized hyperhidrosis: Secondary | ICD-10-CM | POA: Insufficient documentation

## 2021-08-09 DIAGNOSIS — D638 Anemia in other chronic diseases classified elsewhere: Secondary | ICD-10-CM | POA: Diagnosis not present

## 2021-08-09 DIAGNOSIS — R5382 Chronic fatigue, unspecified: Secondary | ICD-10-CM

## 2021-08-09 LAB — CBC WITH DIFFERENTIAL/PLATELET
Abs Immature Granulocytes: 0.2 10*3/uL — ABNORMAL HIGH (ref 0.00–0.07)
Basophils Absolute: 0.1 10*3/uL (ref 0.0–0.1)
Basophils Relative: 0 %
Eosinophils Absolute: 0 10*3/uL (ref 0.0–0.5)
Eosinophils Relative: 0 %
HCT: 30.2 % — ABNORMAL LOW (ref 36.0–46.0)
Hemoglobin: 9.9 g/dL — ABNORMAL LOW (ref 12.0–15.0)
Immature Granulocytes: 1 %
Lymphocytes Relative: 10 %
Lymphs Abs: 1.7 10*3/uL (ref 0.7–4.0)
MCH: 34.6 pg — ABNORMAL HIGH (ref 26.0–34.0)
MCHC: 32.8 g/dL (ref 30.0–36.0)
MCV: 105.6 fL — ABNORMAL HIGH (ref 80.0–100.0)
Monocytes Absolute: 2.7 10*3/uL — ABNORMAL HIGH (ref 0.1–1.0)
Monocytes Relative: 16 %
Neutro Abs: 12 10*3/uL — ABNORMAL HIGH (ref 1.7–7.7)
Neutrophils Relative %: 73 %
Platelets: 128 10*3/uL — ABNORMAL LOW (ref 150–400)
RBC: 2.86 MIL/uL — ABNORMAL LOW (ref 3.87–5.11)
RDW: 16 % — ABNORMAL HIGH (ref 11.5–15.5)
Smear Review: DECREASED
WBC: 16.6 10*3/uL — ABNORMAL HIGH (ref 4.0–10.5)
nRBC: 0 % (ref 0.0–0.2)

## 2021-08-09 LAB — VITAMIN B12: Vitamin B-12: 2021 pg/mL — ABNORMAL HIGH (ref 180–914)

## 2021-08-09 LAB — IRON AND TIBC
Iron: 126 ug/dL (ref 28–170)
Saturation Ratios: 47 % — ABNORMAL HIGH (ref 10.4–31.8)
TIBC: 269 ug/dL (ref 250–450)
UIBC: 143 ug/dL

## 2021-08-09 LAB — FOLATE: Folate: 70 ng/mL (ref >5.9)

## 2021-08-09 LAB — FERRITIN: Ferritin: 210 ng/mL (ref 11–307)

## 2021-08-09 MED ORDER — EPOETIN ALFA-EPBX 40000 UNIT/ML IJ SOLN
40000.0000 [IU] | Freq: Once | INTRAMUSCULAR | Status: AC
  Administered 2021-08-09: 40000 [IU] via SUBCUTANEOUS
  Filled 2021-08-09: qty 1

## 2021-08-09 NOTE — Progress Notes (Signed)
Pt in for follow up, reports having difficulty with moving right little finger.  ?

## 2021-08-09 NOTE — Progress Notes (Signed)
?[name]  ?Telephone:(336) [phone] [phone]) [phone] ? ?ID: Vickie Howard OB: Dec 27, 1935  MR#: [id number]  ZCH#:[id number] ? ?Patient Care Team: ?Adin Hector, MD as PCP - General (Internal Medicine) ? ?CHIEF COMPLAINT: CMML ? ?INTERVAL HISTORY: Patient returns to clinic today for repeat laboratory work, further evaluation, and continuation of Retacrit and observation of CMML.  She continues to have chronic weakness and fatigue and feels at her baseline, but her daughter feels her energy levels have decreased.  She reports intermittent night sweats that require changing bedclothes. She otherwise feels well.  She has no neurologic complaints. She denies any recent fevers or illnesses. She has a good appetite and denies weight loss. She denies any chest pain, shortness of breath, cough, or hemoptysis. She denies any nausea, vomiting, constipation, or diarrhea.  She has no melena or hematochezia.  She has no urinary complaints.  Patient offers no further specific complaints today. ? ? ?REVIEW OF SYSTEMS:   ?Review of Systems  ?Constitutional:  Positive for malaise/fatigue. Negative for fever and weight loss.  ?     Night sweats  ?Respiratory:  Negative for cough, hemoptysis and shortness of breath.   ?Cardiovascular:  Negative for chest pain and leg swelling.  ?Gastrointestinal:  Negative for abdominal pain, blood in stool and melena.  ?Genitourinary:  Negative for dysuria and hematuria.  ?Musculoskeletal:  Negative for back pain, falls and joint pain.  ?Skin: Negative.  Negative for rash.  ?Neurological:  Positive for weakness. Negative for dizziness, sensory change, focal weakness and headaches.  ?Psychiatric/Behavioral:  Positive for memory loss. The patient is not nervous/anxious.   ?As per HPI. Otherwise, a complete review of systems is negative. ? ? ?PAST MEDICAL HISTORY: ?Past Medical History:  ?Diagnosis Date  ? Anemia   ? Arthritis   ? Atrial fibrillation ([place])   ? GERD  (gastroesophageal reflux disease)   ? Heart murmur   ? History of hiatal hernia   ? HLD (hyperlipidemia)   ? HTN (hypertension)   ? Occasional tremors   ? hands  ? ? ?PAST SURGICAL HISTORY: ?Past Surgical History:  ?Procedure Laterality Date  ? JOINT REPLACEMENT    ? right total hip 2015  ? Left total hip arthroplasty  10/12/2014  ? PACEMAKER INSERTION N/A 08/12/2015  ? Procedure: INSERTION PACEMAKER attempt unable to obtain access ;  Surgeon: Isaias Cowman, MD;  Location: [facility] OR[facility];  Service: Cardiovascular;  Laterality: N/A;  ? TOTAL HIP ARTHROPLASTY Left 10/12/2014  ? Procedure: TOTAL HIP ARTHROPLASTY;  Surgeon: Dereck Leep, MD;  Location: [facility] OR[facility];  Service: Orthopedics;  Laterality: Left;  ? TUBAL LIGATION    ? ? ?FAMILY HISTORY: ?Family History  ?Problem Relation Age of Onset  ? Aneurysm Father   ? Breast cancer Daughter   ? ? ?ADVANCED DIRECTIVES (Y/N):  N ? ?HEALTH MAINTENANCE: ?Social History  ? ?Tobacco Use  ? Smoking status: Never  ? Smokeless tobacco: Never  ?Vaping Use  ? Vaping Use: Never used  ?Substance Use Topics  ? Alcohol use: No  ? Drug use: No  ? ? Colonoscopy: ? PAP: ? Bone density: ? Lipid panel: ? ?Allergies  ?Allergen Reactions  ? Codeine Sulfate Other (See Comments)  ?  Reaction:  Syncope  ? Levaquin [Levofloxacin] Nausea And Vomiting  ? Oxycodone Other (See Comments)  ?  Reaction:  Disoriented   ? ? ?Current Outpatient Medications  ?Medication Sig Dispense Refill  ? acetaminophen (TYLENOL) 500 MG tablet Take 1,000 mg by  mouth every 6 (six) hours as needed for mild pain or headache.    ? aspirin EC 81 MG tablet Take 81 mg by mouth every other day.     ? atorvastatin (LIPITOR) 20 MG tablet Take 20 mg by mouth daily.    ? Cyanocobalamin (VITAMIN B 12 PO) Take 1 mcg by mouth daily.    ? docusate sodium (COLACE) 100 MG capsule Take 100 mg by mouth 2 (two) times daily.    ? donepezil (ARICEPT) 5 MG tablet Take 5 mg by mouth daily.    ? feeding supplement, ENSURE ENLIVE, (ENSURE ENLIVE) LIQD  Take 237 mLs by mouth 2 (two) times daily between meals. 237 mL 12  ? ferrous sulfate 325 (65 FE) MG tablet Take 325 mg by mouth daily with breakfast.    ? LANOXIN 62.5 MCG TABS TK 1 T PO ONCE D  11  ? lisinopril (PRINIVIL,ZESTRIL) 2.5 MG tablet Take 2.5 mg by mouth daily.   1  ? mirtazapine (REMERON) 15 MG tablet Take 1 tablet by mouth 1 day or 1 dose.    ? Multiple Vitamin (MULTI-VITAMIN) tablet Take 1 tablet by mouth 1 day or 1 dose.    ? Multiple Vitamins-Minerals (PRESERVISION AREDS 2) CAPS Take 1 capsule by mouth daily.    ? sertraline (ZOLOFT) 50 MG tablet Take 75 mg by mouth daily. Patient taking 1m daily per Dr. KJens Som   ? ?No current facility-administered medications for this visit.  ? ? ?OBJECTIVE: ?Vitals:  ? 08/09/21 1046  ?BP: (!) 145/82  ?Pulse: 65  ?Temp: 98.5 ?F (36.9 ?C)  ?SpO2: 100%  ?   Body mass index is 18.64 kg/m?.Marland Kitchen   ECOG FS:0 - Asymptomatic ? ?General: appears stated age. Frail. Accompanied by daughter.  ?Eyes: Pink conjunctiva, anicteric sclera. ?Lungs: No audible wheezing or coughing ?Abdomen: nondistended. No guarding.  ?Musculoskeletal: No edema, cyanosis ?Neuro: Alert, answering all questions appropriately. Cranial nerves grossly intact. ?Skin: No rashes or petechiae noted. ?Psych: Normal affect. ? ?LAB RESULTS: ? ?Lab Results  ?Component Value Date  ? NA 138 04/19/2021  ? K 4.0 04/19/2021  ? CL 102 04/19/2021  ? CO2 27 04/19/2021  ? GLUCOSE 103 (H) 04/19/2021  ? BUN 26 (H) 04/19/2021  ? CREATININE 0.75 04/19/2021  ? CALCIUM 9.1 04/19/2021  ? PROT 7.5 04/19/2021  ? ALBUMIN 3.9 04/19/2021  ? AST 32 04/19/2021  ? ALT 22 04/19/2021  ? ALKPHOS 54 04/19/2021  ? BILITOT 0.5 04/19/2021  ? GFRNONAA >60 04/19/2021  ? GFRAA >60 02/25/2020  ? ? ?Lab Results  ?Component Value Date  ? WBC PENDING 08/09/2021  ? NEUTROABS PENDING 08/09/2021  ? HGB PENDING 08/09/2021  ? HCT PENDING 08/09/2021  ? MCV PENDING 08/09/2021  ? PLT 128 (L) 08/09/2021  ? ? ? ?STUDIES: ?No results found. ? ?ASSESSMENT:  CMML. ? ?PLAN:  ? ?1. CMML: Confirmed on bone marrow biopsy from June 18, 2018.  Patient noted to have a hypercellular marrow with mild dysplasia in all 3 lineages.  No increased blasts.  Patient's white blood cell count remains elevated and slowly uptrending but stable at 16.6. Chronically elevated monocytosis which is stable and unchanged. We again reviewed options including Vidaza or dacogen in the future if she has increased symptoms but she declines unless absolutely necessary and even then says she has low interest in treatment. This is given her advanced age and other comorbidities. Will continue supportive care as below.  ? ?2.  Anemia: Possibly secondary to  CMML.  Hemoglobin is 9.9 today. Proceed with Retacrit today.  Continue supportive care with Retacrit if hemoglobin falls below 10.0. Return to clinic in 1 and 2 months for laboratory work and Retacrit only.  Patient will then return to clinic in 3 months for laboratory work, further evaluation, and continuation of Retacrit if needed.   ? ?3.  Elevated MCV: Chronic and unchanged.  B12 and folate were previously within normal limits. Pending at time of visit today. Secondary to CMML.   ? ?3.  Thrombocytopenia: Chronic and unchanged.  Patient's platelet count was 128 today.  Improved. Monitor.  ? ?4.  Leukocytosis: Chronic and unchanged.  Secondary to CMML. ? ?5. Night sweats- mild and intermittent. Likely secondary to CMML.  ? ?Disposition: ?Retacrit today ?1 mo- lab (H&H) +/- retacrit ?2 mo- lab (H&H) +/- retacrit ?3 mo- lab (same labs as today), [place], +/- retacrit- la ? ?Patient expressed understanding and was in agreement with this plan. She also understands that She can call clinic at any time with any questions, concerns, or complaints.  ? ? ?Verlon Au, NP   08/09/2021  ? ? ? ? ?

## 2021-08-10 LAB — IGG, IGA, IGM
IgA: 73 mg/dL (ref 64–422)
IgG (Immunoglobin G), Serum: 1557 mg/dL (ref 586–1602)
IgM (Immunoglobulin M), Srm: 158 mg/dL (ref 26–217)

## 2021-08-10 LAB — KAPPA/LAMBDA LIGHT CHAINS
Kappa free light chain: 25.1 mg/L — ABNORMAL HIGH (ref 3.3–19.4)
Kappa, lambda light chain ratio: 1.9 — ABNORMAL HIGH (ref 0.26–1.65)
Lambda free light chains: 13.2 mg/L (ref 5.7–26.3)

## 2021-08-11 LAB — MULTIPLE MYELOMA PANEL, SERUM
Albumin SerPl Elph-Mcnc: 3.8 g/dL (ref 2.9–4.4)
Albumin/Glob SerPl: 1.2 (ref 0.7–1.7)
Alpha 1: 0.3 g/dL (ref 0.0–0.4)
Alpha2 Glob SerPl Elph-Mcnc: 0.7 g/dL (ref 0.4–1.0)
B-Globulin SerPl Elph-Mcnc: 0.8 g/dL (ref 0.7–1.3)
Gamma Glob SerPl Elph-Mcnc: 1.5 g/dL (ref 0.4–1.8)
Globulin, Total: 3.3 g/dL (ref 2.2–3.9)
IgA: 70 mg/dL (ref 64–422)
IgG (Immunoglobin G), Serum: 1655 mg/dL — ABNORMAL HIGH (ref 586–1602)
IgM (Immunoglobulin M), Srm: 163 mg/dL (ref 26–217)
M Protein SerPl Elph-Mcnc: 1 g/dL — ABNORMAL HIGH
Total Protein ELP: 7.1 g/dL (ref 6.0–8.5)

## 2021-09-06 ENCOUNTER — Inpatient Hospital Stay: Payer: 59 | Attending: Oncology

## 2021-09-06 ENCOUNTER — Inpatient Hospital Stay: Payer: 59

## 2021-09-06 DIAGNOSIS — C9310 Chronic myelomonocytic leukemia not having achieved remission: Secondary | ICD-10-CM | POA: Diagnosis present

## 2021-09-06 DIAGNOSIS — D649 Anemia, unspecified: Secondary | ICD-10-CM | POA: Insufficient documentation

## 2021-09-06 DIAGNOSIS — D696 Thrombocytopenia, unspecified: Secondary | ICD-10-CM | POA: Diagnosis not present

## 2021-09-06 DIAGNOSIS — R5382 Chronic fatigue, unspecified: Secondary | ICD-10-CM

## 2021-09-06 LAB — HEMOGLOBIN AND HEMATOCRIT, BLOOD
HCT: 31 % — ABNORMAL LOW (ref 36.0–46.0)
Hemoglobin: 10.1 g/dL — ABNORMAL LOW (ref 12.0–15.0)

## 2021-10-04 ENCOUNTER — Inpatient Hospital Stay: Payer: 59 | Attending: Oncology

## 2021-10-04 ENCOUNTER — Inpatient Hospital Stay: Payer: 59

## 2021-10-04 VITALS — BP 154/60 | HR 62

## 2021-10-04 DIAGNOSIS — D649 Anemia, unspecified: Secondary | ICD-10-CM

## 2021-10-04 DIAGNOSIS — C9310 Chronic myelomonocytic leukemia not having achieved remission: Secondary | ICD-10-CM | POA: Insufficient documentation

## 2021-10-04 DIAGNOSIS — R5382 Chronic fatigue, unspecified: Secondary | ICD-10-CM

## 2021-10-04 DIAGNOSIS — D696 Thrombocytopenia, unspecified: Secondary | ICD-10-CM | POA: Diagnosis not present

## 2021-10-04 DIAGNOSIS — D630 Anemia in neoplastic disease: Secondary | ICD-10-CM | POA: Insufficient documentation

## 2021-10-04 LAB — HEMOGLOBIN AND HEMATOCRIT, BLOOD
HCT: 27.7 % — ABNORMAL LOW (ref 36.0–46.0)
Hemoglobin: 9.1 g/dL — ABNORMAL LOW (ref 12.0–15.0)

## 2021-10-04 MED ORDER — EPOETIN ALFA-EPBX 40000 UNIT/ML IJ SOLN
40000.0000 [IU] | Freq: Once | INTRAMUSCULAR | Status: AC
  Administered 2021-10-04: 40000 [IU] via SUBCUTANEOUS
  Filled 2021-10-04: qty 1

## 2021-10-28 NOTE — Progress Notes (Unsigned)
[place]  [phone] [phone]  ID: Vickie Howard OB: 07-06-1935  MR#: [id number]  IHK#:[id number]  Patient Care Team: Adin Hector, MD as PCP - General (Internal Medicine)  CHIEF COMPLAINT: CMML  INTERVAL HISTORY: Patient returns to clinic today for repeat laboratory work, further evaluation, and continuation of Retacrit.  She continues to have chronic weakness and fatigue and feels at her baseline, but her daughter feels her energy levels have decreased.  She otherwise feels well.  She has no neurologic complaints. She denies any recent fevers or illnesses. She has a good appetite and denies weight loss. She denies any chest pain, shortness of breath, cough, or hemoptysis. She denies any nausea, vomiting, constipation, or diarrhea.  She has no melena or hematochezia.  She has no urinary complaints.  Patient offers no further specific complaints today.  REVIEW OF SYSTEMS:   Review of Systems  Constitutional:  Positive for malaise/fatigue. Negative for fever and weight loss.  Respiratory: Negative.  Negative for cough, hemoptysis and shortness of breath.   Cardiovascular: Negative.  Negative for chest pain and leg swelling.  Gastrointestinal: Negative.  Negative for abdominal pain, blood in stool and melena.  Genitourinary: Negative.  Negative for hematuria.  Musculoskeletal: Negative.  Negative for back pain, falls and joint pain.  Skin: Negative.  Negative for rash.  Neurological:  Positive for weakness. Negative for dizziness, sensory change, focal weakness and headaches.  Psychiatric/Behavioral:  Positive for memory loss. The patient is not nervous/anxious.    As per HPI. Otherwise, a complete review of systems is negative.  PAST MEDICAL HISTORY: Past Medical History:  Diagnosis Date   Anemia    Arthritis    Atrial fibrillation (HCC)    GERD (gastroesophageal reflux disease)    Heart murmur    History of hiatal hernia     HLD (hyperlipidemia)    HTN (hypertension)    Occasional tremors    hands    PAST SURGICAL HISTORY: Past Surgical History:  Procedure Laterality Date   JOINT REPLACEMENT     right total hip 2015   Left total hip arthroplasty  10/12/2014   PACEMAKER INSERTION N/A 08/12/2015   Procedure: INSERTION PACEMAKER attempt unable to obtain access ;  Surgeon: Isaias Cowman, MD;  Location: [facility] ORS;  Service: Cardiovascular;  Laterality: N/A;   TOTAL HIP ARTHROPLASTY Left 10/12/2014   Procedure: TOTAL HIP ARTHROPLASTY;  Surgeon: Dereck Leep, MD;  Location: [facility] OR[facility];  Service: Orthopedics;  Laterality: Left;   TUBAL LIGATION      FAMILY HISTORY: Family History  Problem Relation Age of Onset   Aneurysm Father    Breast cancer Daughter     ADVANCED DIRECTIVES (Y/N):  N  HEALTH MAINTENANCE: Social History   Tobacco Use   Smoking status: Never   Smokeless tobacco: Never  Vaping Use   Vaping Use: Never used  Substance Use Topics   Alcohol use: No   Drug use: No     Colonoscopy:  PAP:  Bone density:  Lipid panel:  Allergies  Allergen Reactions   Codeine Sulfate Other (See Comments)    Reaction:  Syncope   Levaquin [Levofloxacin] Nausea And Vomiting   Oxycodone Other (See Comments)    Reaction:  Disoriented     Current Outpatient Medications  Medication Sig Dispense Refill   acetaminophen (TYLENOL) 500 MG tablet Take 1,000 mg by mouth every 6 (six) hours as needed for mild pain or headache.     aspirin EC  81 MG tablet Take 81 mg by mouth every other day.      atorvastatin (LIPITOR) 20 MG tablet Take 20 mg by mouth daily.     Cyanocobalamin (VITAMIN B 12 PO) Take 1 mcg by mouth daily.     docusate sodium (COLACE) 100 MG capsule Take 100 mg by mouth 2 (two) times daily.     donepezil (ARICEPT) 5 MG tablet Take 5 mg by mouth daily.     feeding supplement, ENSURE ENLIVE, (ENSURE ENLIVE) LIQD Take 237 mLs by mouth 2 (two) times daily between meals. 237 mL 12   ferrous  sulfate 325 (65 FE) MG tablet Take 325 mg by mouth daily with breakfast.     LANOXIN 62.5 MCG TABS TK 1 T PO ONCE D  11   lisinopril (PRINIVIL,ZESTRIL) 2.5 MG tablet Take 2.5 mg by mouth daily.   1   mirtazapine (REMERON) 15 MG tablet Take 1 tablet by mouth 1 day or 1 dose.     Multiple Vitamin (MULTI-VITAMIN) tablet Take 1 tablet by mouth 1 day or 1 dose.     Multiple Vitamins-Minerals (PRESERVISION AREDS 2) CAPS Take 1 capsule by mouth daily.     sertraline (ZOLOFT) 50 MG tablet Take 75 mg by mouth daily. Patient taking $RemoveBefore'75mg'[id number]$  daily per Dr. Jens Som     No current facility-administered medications for this visit.    OBJECTIVE: There were no vitals filed for this visit.    There is no height or weight on file to calculate BMI.    ECOG FS:0 - Asymptomatic  General: Well-developed, well-nourished, no acute distress. Eyes: Pink conjunctiva, anicteric sclera. HEENT: Normocephalic, moist mucous membranes. Lungs: No audible wheezing or coughing. Heart: Regular rate and rhythm. Abdomen: Soft, nontender, no obvious distention. Musculoskeletal: No edema, cyanosis, or clubbing. Neuro: Alert, answering all questions appropriately. Cranial nerves grossly intact. Skin: No rashes or petechiae noted. Psych: Normal affect.   LAB RESULTS:  Lab Results  Component Value Date   NA 138 04/19/2021   K 4.0 04/19/2021   CL 102 04/19/2021   CO2 27 04/19/2021   GLUCOSE 103 (H) 04/19/2021   BUN 26 (H) 04/19/2021   CREATININE 0.75 04/19/2021   CALCIUM 9.1 04/19/2021   PROT 7.5 04/19/2021   ALBUMIN 3.9 04/19/2021   AST 32 04/19/2021   ALT 22 04/19/2021   ALKPHOS 54 04/19/2021   BILITOT 0.5 04/19/2021   GFRNONAA >60 04/19/2021   GFRAA >60 02/25/2020    Lab Results  Component Value Date   WBC 16.6 (H) 08/09/2021   NEUTROABS 12.0 (H) 08/09/2021   HGB 9.1 (L) 10/04/2021   HCT 27.7 (L) 10/04/2021   MCV 105.6 (H) 08/09/2021   PLT 128 (L) 08/09/2021     STUDIES: No results found.  ASSESSMENT:  CMML.  PLAN:   1. CMML: Confirmed on bone marrow biopsy from June 18, 2018.  Patient noted to have a hypercellular marrow with mild dysplasia in all 3 lineages.  No increased blasts.  Patient's white blood cell count remains elevated, but approximately her baseline at 14.8.  She also has a chronically elevated absolute monocytosis level of 2.7.  Given her advanced age and she is asymptomatic, she does not require treatment at this time.  Can consider Vidaza or Dacogen in the future if absolutely necessary, but patient has indicated she would likely decline treatment.  Proceed with Retacrit today.  Continue supportive care with Retacrit if hemoglobin falls below 10.0. 2.  Anemia: Possibly secondary to CMML.  Hemoglobin  is 10.0 today.  Given patient's persistent weakness and fatigue we will proceed with Retacrit as scheduled.  Return to clinic in 1 and 2 months for laboratory work and Retacrit only.  Patient will then return to clinic in 3 months for laboratory work, further evaluation, and continuation of Retacrit if needed.   3.  Elevated MCV: Chronic and unchanged.  B12 and folate are within normal limits.  Secondary to CMML.   3.  Thrombocytopenia: Chronic and unchanged.  Patient's platelet count was 105 today.   4.  Leukocytosis: Chronic and unchanged.  Secondary to CMML.   Patient expressed understanding and was in agreement with this plan. She also understands that She can call clinic at any time with any questions, concerns, or complaints.    Lloyd Huger, MD   10/28/2021 10:15 AM

## 2021-11-01 ENCOUNTER — Encounter: Payer: Self-pay | Admitting: Oncology

## 2021-11-01 ENCOUNTER — Inpatient Hospital Stay (HOSPITAL_BASED_OUTPATIENT_CLINIC_OR_DEPARTMENT_OTHER): Payer: 59 | Admitting: Oncology

## 2021-11-01 ENCOUNTER — Inpatient Hospital Stay: Payer: 59

## 2021-11-01 VITALS — BP 132/46 | HR 61 | Temp 96.7°F | Resp 16 | Ht 59.0 in | Wt 94.0 lb

## 2021-11-01 DIAGNOSIS — C9310 Chronic myelomonocytic leukemia not having achieved remission: Secondary | ICD-10-CM | POA: Diagnosis not present

## 2021-11-01 DIAGNOSIS — D649 Anemia, unspecified: Secondary | ICD-10-CM

## 2021-11-01 LAB — CBC WITH DIFFERENTIAL/PLATELET
Abs Immature Granulocytes: 0.2 10*3/uL — ABNORMAL HIGH (ref 0.00–0.07)
Basophils Absolute: 0.1 10*3/uL (ref 0.0–0.1)
Basophils Relative: 0 %
Eosinophils Absolute: 0 10*3/uL (ref 0.0–0.5)
Eosinophils Relative: 0 %
HCT: 29.2 % — ABNORMAL LOW (ref 36.0–46.0)
Hemoglobin: 9.5 g/dL — ABNORMAL LOW (ref 12.0–15.0)
Immature Granulocytes: 1 %
Lymphocytes Relative: 10 %
Lymphs Abs: 1.6 10*3/uL (ref 0.7–4.0)
MCH: 34.7 pg — ABNORMAL HIGH (ref 26.0–34.0)
MCHC: 32.5 g/dL (ref 30.0–36.0)
MCV: 106.6 fL — ABNORMAL HIGH (ref 80.0–100.0)
Monocytes Absolute: 3.2 10*3/uL — ABNORMAL HIGH (ref 0.1–1.0)
Monocytes Relative: 20 %
Neutro Abs: 11.4 10*3/uL — ABNORMAL HIGH (ref 1.7–7.7)
Neutrophils Relative %: 69 %
Platelets: 101 10*3/uL — ABNORMAL LOW (ref 150–400)
RBC: 2.74 MIL/uL — ABNORMAL LOW (ref 3.87–5.11)
RDW: 16 % — ABNORMAL HIGH (ref 11.5–15.5)
Smear Review: NORMAL
WBC: 16.5 10*3/uL — ABNORMAL HIGH (ref 4.0–10.5)
nRBC: 0 % (ref 0.0–0.2)

## 2021-11-01 MED ORDER — EPOETIN ALFA-EPBX 40000 UNIT/ML IJ SOLN
40000.0000 [IU] | Freq: Once | INTRAMUSCULAR | Status: AC
  Administered 2021-11-01: 40000 [IU] via SUBCUTANEOUS
  Filled 2021-11-01: qty 1

## 2021-12-02 ENCOUNTER — Inpatient Hospital Stay: Payer: 59 | Attending: Nurse Practitioner

## 2021-12-02 ENCOUNTER — Inpatient Hospital Stay: Payer: 59

## 2021-12-02 DIAGNOSIS — C9310 Chronic myelomonocytic leukemia not having achieved remission: Secondary | ICD-10-CM | POA: Insufficient documentation

## 2021-12-02 DIAGNOSIS — R5382 Chronic fatigue, unspecified: Secondary | ICD-10-CM

## 2021-12-02 DIAGNOSIS — D630 Anemia in neoplastic disease: Secondary | ICD-10-CM | POA: Diagnosis present

## 2021-12-02 DIAGNOSIS — D649 Anemia, unspecified: Secondary | ICD-10-CM

## 2021-12-02 LAB — CBC WITH DIFFERENTIAL/PLATELET
Abs Immature Granulocytes: 0.26 10*3/uL — ABNORMAL HIGH (ref 0.00–0.07)
Basophils Absolute: 0.1 10*3/uL (ref 0.0–0.1)
Basophils Relative: 1 %
Eosinophils Absolute: 0 10*3/uL (ref 0.0–0.5)
Eosinophils Relative: 0 %
HCT: 30.3 % — ABNORMAL LOW (ref 36.0–46.0)
Hemoglobin: 10 g/dL — ABNORMAL LOW (ref 12.0–15.0)
Immature Granulocytes: 2 %
Lymphocytes Relative: 8 %
Lymphs Abs: 1.4 10*3/uL (ref 0.7–4.0)
MCH: 35 pg — ABNORMAL HIGH (ref 26.0–34.0)
MCHC: 33 g/dL (ref 30.0–36.0)
MCV: 105.9 fL — ABNORMAL HIGH (ref 80.0–100.0)
Monocytes Absolute: 2.9 10*3/uL — ABNORMAL HIGH (ref 0.1–1.0)
Monocytes Relative: 17 %
Neutro Abs: 12.8 10*3/uL — ABNORMAL HIGH (ref 1.7–7.7)
Neutrophils Relative %: 72 %
Platelets: 111 10*3/uL — ABNORMAL LOW (ref 150–400)
RBC: 2.86 MIL/uL — ABNORMAL LOW (ref 3.87–5.11)
RDW: 15.8 % — ABNORMAL HIGH (ref 11.5–15.5)
Smear Review: NORMAL
WBC: 17.5 10*3/uL — ABNORMAL HIGH (ref 4.0–10.5)
nRBC: 0 % (ref 0.0–0.2)

## 2021-12-02 LAB — FOLATE: Folate: >40 ng/mL (ref >5.9)

## 2021-12-02 LAB — IRON AND TIBC
Iron: 119 ug/dL (ref 28–170)
Saturation Ratios: 47 % — ABNORMAL HIGH (ref 10.4–31.8)
TIBC: 252 ug/dL (ref 250–450)
UIBC: 133 ug/dL

## 2021-12-02 LAB — VITAMIN B12: Vitamin B-12: 2256 pg/mL — ABNORMAL HIGH (ref 180–914)

## 2021-12-02 LAB — FERRITIN: Ferritin: 272 ng/mL (ref 11–307)

## 2021-12-03 LAB — IGG, IGA, IGM
IgA: 79 mg/dL (ref 64–422)
IgG (Immunoglobin G), Serum: 1694 mg/dL — ABNORMAL HIGH (ref 586–1602)
IgM (Immunoglobulin M), Srm: 187 mg/dL (ref 26–217)

## 2021-12-05 LAB — KAPPA/LAMBDA LIGHT CHAINS
Kappa free light chain: 29.2 mg/L — ABNORMAL HIGH (ref 3.3–19.4)
Kappa, lambda light chain ratio: 1.97 — ABNORMAL HIGH (ref 0.26–1.65)
Lambda free light chains: 14.8 mg/L (ref 5.7–26.3)

## 2021-12-09 LAB — MULTIPLE MYELOMA PANEL, SERUM
Albumin SerPl Elph-Mcnc: 3.5 g/dL (ref 2.9–4.4)
Albumin/Glob SerPl: 1.1 (ref 0.7–1.7)
Alpha 1: 0.2 g/dL (ref 0.0–0.4)
Alpha2 Glob SerPl Elph-Mcnc: 0.8 g/dL (ref 0.4–1.0)
B-Globulin SerPl Elph-Mcnc: 0.8 g/dL (ref 0.7–1.3)
Gamma Glob SerPl Elph-Mcnc: 1.5 g/dL (ref 0.4–1.8)
Globulin, Total: 3.3 g/dL (ref 2.2–3.9)
IgA: 83 mg/dL (ref 64–422)
IgG (Immunoglobin G), Serum: 1741 mg/dL — ABNORMAL HIGH (ref 586–1602)
IgM (Immunoglobulin M), Srm: 182 mg/dL (ref 26–217)
M Protein SerPl Elph-Mcnc: 1.1 g/dL — ABNORMAL HIGH
Total Protein ELP: 6.8 g/dL (ref 6.0–8.5)

## 2022-01-02 ENCOUNTER — Inpatient Hospital Stay: Payer: 59

## 2022-01-02 ENCOUNTER — Inpatient Hospital Stay: Payer: 59 | Attending: Nurse Practitioner

## 2022-01-02 VITALS — BP 131/45 | HR 61

## 2022-01-02 DIAGNOSIS — C9310 Chronic myelomonocytic leukemia not having achieved remission: Secondary | ICD-10-CM

## 2022-01-02 DIAGNOSIS — D630 Anemia in neoplastic disease: Secondary | ICD-10-CM | POA: Diagnosis present

## 2022-01-02 DIAGNOSIS — D649 Anemia, unspecified: Secondary | ICD-10-CM

## 2022-01-02 LAB — CBC WITH DIFFERENTIAL/PLATELET
Abs Immature Granulocytes: 0.27 10*3/uL — ABNORMAL HIGH (ref 0.00–0.07)
Basophils Absolute: 0.1 10*3/uL (ref 0.0–0.1)
Basophils Relative: 0 %
Eosinophils Absolute: 0 10*3/uL (ref 0.0–0.5)
Eosinophils Relative: 0 %
HCT: 28.2 % — ABNORMAL LOW (ref 36.0–46.0)
Hemoglobin: 9.3 g/dL — ABNORMAL LOW (ref 12.0–15.0)
Immature Granulocytes: 2 %
Lymphocytes Relative: 8 %
Lymphs Abs: 1.5 10*3/uL (ref 0.7–4.0)
MCH: 34.4 pg — ABNORMAL HIGH (ref 26.0–34.0)
MCHC: 33 g/dL (ref 30.0–36.0)
MCV: 104.4 fL — ABNORMAL HIGH (ref 80.0–100.0)
Monocytes Absolute: 3 10*3/uL — ABNORMAL HIGH (ref 0.1–1.0)
Monocytes Relative: 17 %
Neutro Abs: 12.6 10*3/uL — ABNORMAL HIGH (ref 1.7–7.7)
Neutrophils Relative %: 73 %
Platelets: 96 10*3/uL — ABNORMAL LOW (ref 150–400)
RBC: 2.7 MIL/uL — ABNORMAL LOW (ref 3.87–5.11)
RDW: 15.6 % — ABNORMAL HIGH (ref 11.5–15.5)
Smear Review: NORMAL
WBC: 17.4 10*3/uL — ABNORMAL HIGH (ref 4.0–10.5)
nRBC: 0 % (ref 0.0–0.2)

## 2022-01-02 MED ORDER — DARBEPOETIN ALFA 200 MCG/0.4ML IJ SOSY
200.0000 ug | PREFILLED_SYRINGE | Freq: Once | INTRAMUSCULAR | Status: AC
  Administered 2022-01-02: 200 ug via SUBCUTANEOUS
  Filled 2022-01-02: qty 0.4

## 2022-01-27 ENCOUNTER — Telehealth: Payer: Self-pay | Admitting: Nurse Practitioner

## 2022-01-27 NOTE — Telephone Encounter (Signed)
Nurse called from [place] [facility] wanting to know if she could fax order for labs over to [place] so that pt can have labs draw one time.

## 2022-01-30 ENCOUNTER — Other Ambulatory Visit: Payer: Self-pay

## 2022-01-30 DIAGNOSIS — C9310 Chronic myelomonocytic leukemia not having achieved remission: Secondary | ICD-10-CM

## 2022-02-02 ENCOUNTER — Inpatient Hospital Stay: Payer: 59 | Attending: Nurse Practitioner

## 2022-02-02 ENCOUNTER — Inpatient Hospital Stay: Payer: 59

## 2022-02-02 ENCOUNTER — Encounter: Payer: Self-pay | Admitting: Internal Medicine

## 2022-02-02 DIAGNOSIS — D630 Anemia in neoplastic disease: Secondary | ICD-10-CM | POA: Insufficient documentation

## 2022-02-02 DIAGNOSIS — Z79899 Other long term (current) drug therapy: Secondary | ICD-10-CM | POA: Insufficient documentation

## 2022-02-02 DIAGNOSIS — C9310 Chronic myelomonocytic leukemia not having achieved remission: Secondary | ICD-10-CM | POA: Insufficient documentation

## 2022-02-02 LAB — CBC WITH DIFFERENTIAL/PLATELET
Abs Immature Granulocytes: 0.19 10*3/uL — ABNORMAL HIGH (ref 0.00–0.07)
Basophils Absolute: 0.1 10*3/uL (ref 0.0–0.1)
Basophils Relative: 1 %
Eosinophils Absolute: 0 10*3/uL (ref 0.0–0.5)
Eosinophils Relative: 0 %
HCT: 31.4 % — ABNORMAL LOW (ref 36.0–46.0)
Hemoglobin: 10.1 g/dL — ABNORMAL LOW (ref 12.0–15.0)
Immature Granulocytes: 1 %
Lymphocytes Relative: 8 %
Lymphs Abs: 1.3 10*3/uL (ref 0.7–4.0)
MCH: 34.2 pg — ABNORMAL HIGH (ref 26.0–34.0)
MCHC: 32.2 g/dL (ref 30.0–36.0)
MCV: 106.4 fL — ABNORMAL HIGH (ref 80.0–100.0)
Monocytes Absolute: 2.5 10*3/uL — ABNORMAL HIGH (ref 0.1–1.0)
Monocytes Relative: 16 %
Neutro Abs: 11.5 10*3/uL — ABNORMAL HIGH (ref 1.7–7.7)
Neutrophils Relative %: 74 %
Platelets: 95 10*3/uL — ABNORMAL LOW (ref 150–400)
RBC: 2.95 MIL/uL — ABNORMAL LOW (ref 3.87–5.11)
RDW: 16.1 % — ABNORMAL HIGH (ref 11.5–15.5)
Smear Review: NORMAL
WBC: 15.5 10*3/uL — ABNORMAL HIGH (ref 4.0–10.5)
nRBC: 0 % (ref 0.0–0.2)

## 2022-02-02 LAB — COMPREHENSIVE METABOLIC PANEL
ALT: 30 U/L (ref 0–44)
AST: 37 U/L (ref 15–41)
Albumin: 4.1 g/dL (ref 3.5–5.0)
Alkaline Phosphatase: 60 U/L (ref 38–126)
Anion gap: 9 (ref 5–15)
BUN: 30 mg/dL — ABNORMAL HIGH (ref 8–23)
CO2: 27 mmol/L (ref 22–32)
Calcium: 8.9 mg/dL (ref 8.9–10.3)
Chloride: 102 mmol/L (ref 98–111)
Creatinine, Ser: 0.8 mg/dL (ref 0.44–1.00)
GFR, Estimated: >60 mL/min (ref >60)
Glucose, Bld: 128 mg/dL — ABNORMAL HIGH (ref 70–99)
Potassium: 3.8 mmol/L (ref 3.5–5.1)
Sodium: 138 mmol/L (ref 135–145)
Total Bilirubin: 0.3 mg/dL (ref 0.3–1.2)
Total Protein: 7.8 g/dL (ref 6.5–8.1)

## 2022-02-02 LAB — LIPID PANEL
Cholesterol: 133 mg/dL (ref 0–200)
HDL: 42 mg/dL (ref >40)
LDL Cholesterol: 71 mg/dL (ref 0–99)
Total CHOL/HDL Ratio: 3.2 RATIO
Triglycerides: 100 mg/dL (ref <150)
VLDL: 20 mg/dL (ref 0–40)

## 2022-02-02 LAB — TSH: TSH: 5.834 u[IU]/mL — ABNORMAL HIGH (ref 0.350–4.500)

## 2022-02-03 ENCOUNTER — Encounter: Payer: Self-pay | Admitting: Internal Medicine

## 2022-03-07 ENCOUNTER — Inpatient Hospital Stay (HOSPITAL_BASED_OUTPATIENT_CLINIC_OR_DEPARTMENT_OTHER): Payer: 59 | Admitting: Nurse Practitioner

## 2022-03-07 ENCOUNTER — Inpatient Hospital Stay: Payer: 59 | Attending: Nurse Practitioner | Admitting: Nurse Practitioner

## 2022-03-07 ENCOUNTER — Inpatient Hospital Stay: Payer: 59

## 2022-03-07 VITALS — BP 141/62 | HR 63 | Temp 97.5°F | Wt 93.0 lb

## 2022-03-07 DIAGNOSIS — D630 Anemia in neoplastic disease: Secondary | ICD-10-CM | POA: Diagnosis present

## 2022-03-07 DIAGNOSIS — C9310 Chronic myelomonocytic leukemia not having achieved remission: Secondary | ICD-10-CM | POA: Insufficient documentation

## 2022-03-07 DIAGNOSIS — D696 Thrombocytopenia, unspecified: Secondary | ICD-10-CM | POA: Insufficient documentation

## 2022-03-07 DIAGNOSIS — E039 Hypothyroidism, unspecified: Secondary | ICD-10-CM | POA: Insufficient documentation

## 2022-03-07 DIAGNOSIS — D649 Anemia, unspecified: Secondary | ICD-10-CM

## 2022-03-07 LAB — CBC WITH DIFFERENTIAL/PLATELET
Abs Immature Granulocytes: 0.17 10*3/uL — ABNORMAL HIGH (ref 0.00–0.07)
Basophils Absolute: 0.1 10*3/uL (ref 0.0–0.1)
Basophils Relative: 1 %
Eosinophils Absolute: 0 10*3/uL (ref 0.0–0.5)
Eosinophils Relative: 0 %
HCT: 29.2 % — ABNORMAL LOW (ref 36.0–46.0)
Hemoglobin: 9.6 g/dL — ABNORMAL LOW (ref 12.0–15.0)
Immature Granulocytes: 1 %
Lymphocytes Relative: 10 %
Lymphs Abs: 1.5 10*3/uL (ref 0.7–4.0)
MCH: 34 pg (ref 26.0–34.0)
MCHC: 32.9 g/dL (ref 30.0–36.0)
MCV: 103.5 fL — ABNORMAL HIGH (ref 80.0–100.0)
Monocytes Absolute: 2.7 10*3/uL — ABNORMAL HIGH (ref 0.1–1.0)
Monocytes Relative: 18 %
Neutro Abs: 10.5 10*3/uL — ABNORMAL HIGH (ref 1.7–7.7)
Neutrophils Relative %: 70 %
Platelets: 94 10*3/uL — ABNORMAL LOW (ref 150–400)
RBC: 2.82 MIL/uL — ABNORMAL LOW (ref 3.87–5.11)
RDW: 16.2 % — ABNORMAL HIGH (ref 11.5–15.5)
Smear Review: DECREASED
WBC: 14.9 10*3/uL — ABNORMAL HIGH (ref 4.0–10.5)
nRBC: 0 % (ref 0.0–0.2)

## 2022-03-07 LAB — TSH: TSH: 4.837 u[IU]/mL — ABNORMAL HIGH (ref 0.350–4.500)

## 2022-03-07 LAB — T4, FREE: Free T4: 0.5 ng/dL — ABNORMAL LOW (ref 0.61–1.12)

## 2022-03-07 MED ORDER — DARBEPOETIN ALFA 200 MCG/0.4ML IJ SOSY: 200.0000 ug | PREFILLED_SYRINGE | Freq: Once | INTRAMUSCULAR | Status: DC

## 2022-03-07 MED ORDER — DARBEPOETIN ALFA 200 MCG/0.4ML IJ SOSY
200.0000 ug | PREFILLED_SYRINGE | Freq: Once | INTRAMUSCULAR | Status: AC
  Administered 2022-03-07: 200 ug via SUBCUTANEOUS
  Filled 2022-03-07: qty 0.4

## 2022-03-07 NOTE — Progress Notes (Signed)
[place]  [phone] [phone]  ID: Demetrio Lapping OB: [birth year]  MR#: [id number]  POE#:[id number]  Patient Care Team: Adin Hector, MD as PCP - General (Internal Medicine)  CHIEF COMPLAINT: CMML  INTERVAL HISTORY: Patient returns to clinic today for repeat laboratory work, further evaluation, and continuation of Aranesp.  Patient has chronic weakness and fatigue which is unchanged. She denies interval infections, falls, or lumps. She has stable appetite and weight is stable. No chest pain, shortness of breath, cough, or hemoptysis.   Patient continues to have chronic weakness and fatigue, but admits to have "good days and bad days".  She otherwise feels well.  She has no neurologic complaints. She denies any recent fevers or illnesses. She has a good appetite and denies weight loss. She denies any chest pain, shortness of breath, cough, or hemoptysis. She denies any nausea, vomiting, constipation, or diarrhea.  She has no melena or hematochezia.  She has no urinary complaints.  Patient offers no further specific complaints today.  REVIEW OF SYSTEMS:   Review of Systems  Constitutional:  Positive for malaise/fatigue. Negative for fever and weight loss.  Respiratory: Negative.  Negative for cough, hemoptysis and shortness of breath.   Cardiovascular: Negative.  Negative for chest pain and leg swelling.  Gastrointestinal: Negative.  Negative for abdominal pain, blood in stool and melena.  Genitourinary: Negative.  Negative for hematuria.  Musculoskeletal: Negative.  Negative for back pain, falls and joint pain.  Skin: Negative.  Negative for rash.  Neurological:  Positive for weakness. Negative for dizziness, sensory change, focal weakness and headaches.  Psychiatric/Behavioral:  Positive for memory loss. The patient is not nervous/anxious.   As per HPI. Otherwise, a complete review of systems is negative.  PAST MEDICAL HISTORY: Past  Medical History:  Diagnosis Date   Anemia    Arthritis    Atrial fibrillation (HCC)    GERD (gastroesophageal reflux disease)    Heart murmur    History of hiatal hernia    HLD (hyperlipidemia)    HTN (hypertension)    Occasional tremors    hands    PAST SURGICAL HISTORY: Past Surgical History:  Procedure Laterality Date   JOINT REPLACEMENT     right total hip 2015   Left total hip arthroplasty  10/12/2014   PACEMAKER INSERTION N/A 08/12/2015   Procedure: INSERTION PACEMAKER attempt unable to obtain access ;  Surgeon: Isaias Cowman, MD;  Location: [facility];  Service: Cardiovascular;  Laterality: N/A;   TOTAL HIP ARTHROPLASTY Left 10/12/2014   Procedure: TOTAL HIP ARTHROPLASTY;  Surgeon: Dereck Leep, MD;  Location: [facility] OR[facility];  Service: Orthopedics;  Laterality: Left;   TUBAL LIGATION      FAMILY HISTORY: Family History  Problem Relation Age of Onset   Aneurysm Father    Breast cancer Daughter     ADVANCED DIRECTIVES (Y/N):  N  HEALTH MAINTENANCE: Social History   Tobacco Use   Smoking status: Never   Smokeless tobacco: Never  Vaping Use   Vaping Use: Never used  Substance Use Topics   Alcohol use: No   Drug use: No    Colonoscopy:  PAP:  Bone density:  Lipid panel:  Allergies  Allergen Reactions   Codeine Sulfate Other (See Comments)    Reaction:  Syncope   Levaquin [Levofloxacin] Nausea And Vomiting   Oxycodone Other (See Comments)    Reaction:  Disoriented     Current Outpatient Medications  Medication Sig Dispense Refill  acetaminophen (TYLENOL) 500 MG tablet Take 1,000 mg by mouth every 6 (six) hours as needed for mild pain or headache.     aspirin EC 81 MG tablet Take 81 mg by mouth every other day.      atorvastatin (LIPITOR) 20 MG tablet Take 20 mg by mouth daily.     Cyanocobalamin (VITAMIN B 12 PO) Take 1 mcg by mouth daily.     docusate sodium (COLACE) 100 MG capsule Take 100 mg by mouth 2 (two) times daily.     donepezil (ARICEPT) 5 MG  tablet Take 5 mg by mouth daily.     feeding supplement, ENSURE ENLIVE, (ENSURE ENLIVE) LIQD Take 237 mLs by mouth 2 (two) times daily between meals. 237 mL 12   ferrous sulfate 325 (65 FE) MG tablet Take 325 mg by mouth daily with breakfast.     LANOXIN 62.5 MCG TABS TK 1 T PO ONCE D (Patient not taking: Reported on 11/01/2021)  11   lisinopril (PRINIVIL,ZESTRIL) 2.5 MG tablet Take 2.5 mg by mouth daily.   1   Magnesium 200 MG TABS Take 200 mg by mouth daily.     mirtazapine (REMERON) 15 MG tablet Take 1 tablet by mouth 1 day or 1 dose.     Multiple Vitamin (MULTI-VITAMIN) tablet Take 1 tablet by mouth 1 day or 1 dose.     Multiple Vitamins-Minerals (PRESERVISION AREDS 2) CAPS Take 1 capsule by mouth daily.     pramipexole (MIRAPEX) 0.125 MG tablet Take by mouth.     sertraline (ZOLOFT) 50 MG tablet Take 75 mg by mouth daily. Patient taking $RemoveBefore'75mg'[id number]$  daily per Dr. Jens Som     tiZANidine (ZANAFLEX) 2 MG tablet Take 2 mg by mouth at bedtime.     No current facility-administered medications for this visit.    OBJECTIVE: Vitals:   03/07/22 1055  BP: (!) 141/62  Pulse: 63  Temp: (!) 97.5 F (36.4 C)     Body mass index is 18.78 kg/m.    ECOG FS:0 - Asymptomatic  General: Thin build. Frail appearing. Sitting in a wheelchair. Accompanied by daughter.  Eyes: Pink conjunctiva, anicteric sclera. HEENT: Normocephalic, moist mucous membranes. Lungs: No audible wheezing or coughing. Heart: murmur.  Abdomen: Soft, nontender, no obvious distention. Musculoskeletal: No edema, cyanosis, or clubbing. Neuro: Alert, answering all questions appropriately. Cranial nerves grossly intact. Skin: No rashes or petechiae noted. Psych: Normal affect.  LAB RESULTS:  Lab Results  Component Value Date   NA 138 02/02/2022   K 3.8 02/02/2022   CL 102 02/02/2022   CO2 27 02/02/2022   GLUCOSE 128 (H) 02/02/2022   BUN 30 (H) 02/02/2022   CREATININE 0.80 02/02/2022   CALCIUM 8.9 02/02/2022   PROT 7.8 02/02/2022    ALBUMIN 4.1 02/02/2022   AST 37 02/02/2022   ALT 30 02/02/2022   ALKPHOS 60 02/02/2022   BILITOT 0.3 02/02/2022   GFRNONAA >60 02/02/2022   GFRAA >60 02/25/2020    Lab Results  Component Value Date   WBC 14.9 (H) 03/07/2022   NEUTROABS 10.5 (H) 03/07/2022   HGB 9.6 (L) 03/07/2022   HCT 29.2 (L) 03/07/2022   MCV 103.5 (H) 03/07/2022   PLT 94 (L) 03/07/2022    STUDIES: No results found.  ASSESSMENT: CMML.  PLAN:   1. CMML: Confirmed on bone marrow biopsy from June 18, 2018.  Patient noted to have a hypercellular marrow with mild dysplasia in all 3 lineages.  No increased blasts.  Patient's white blood  cell count remains chronically elevated ranging between 10.7 and 16.9 since February 2022.  Her absolute monocyte level has ranged between 1.7 and 3.2 since March 2020. Given her advanced age and she is asymptomatic, she does not require treatment at this time.  Can consider Vidaza or Dacogen in the future if absolutely necessary, but patient has indicated she would likely decline treatment.  Proceed with Aranesp today. Continue with supportive care with Retacrit if her hemoglobin remains below 10.0.  2.  Anemia: Possibly secondary to CMML.  Hemoglobin is 9.6 today. Proceed with Aranesp 200 mcg as ordered.  Return to clinic in 1, 2, 3 months for laboratory work and Aranesp only if hemoglobin remains below 10.0.  Patient will then return to clinic in 4 months with repeat laboratory work, further evaluation, and continuation of treatment.    3.  Elevated MCV: Chronic and unchanged.  B12 and folate are within normal limits.  Secondary to CMML.    3.  Thrombocytopenia: Chronic and unchanged.  Patient's platelet count is 94 today.  4.  Leukocytosis: Chronic and unchanged.  Secondary to CMML..  5. Hypothyroidism- TSH and fT4 drawn at request of pcp, Dr. Caryl Comes. Will send results to him for management.   Disposition: aranesp today. 1 mo- lab (H&H), +/- aranesp 2 mo- lab (H&H), +/-  aranesp 3 mo- lab (H&H), +/- aranesp 4 mo- lab (cbc, cmp), Dr. Grayland Ormond, +/- aranesp  Patient expressed understanding and was in agreement with this plan. She also understands that She can call clinic at any time with any questions, concerns, or complaints.   Thank you for allowing me to participate in the care of this patient.   Verlon Au, NP   03/07/2022

## 2022-03-08 ENCOUNTER — Encounter: Payer: Self-pay | Admitting: Oncology

## 2022-04-04 ENCOUNTER — Telehealth: Payer: Self-pay

## 2022-04-04 ENCOUNTER — Encounter: Payer: Self-pay | Admitting: Oncology

## 2022-04-04 NOTE — Telephone Encounter (Signed)
Spoke with patient's daughter [name] regarding Palliative Care services. She requested to call back after checking patient's schedule.

## 2022-04-04 NOTE — Telephone Encounter (Signed)
Spoke with patient's daughter [name] and scheduled a telephonic Palliative Consult for 04/11/22 @ 11 AM.  Consent obtained; updated [employer], Team List and Epic.

## 2022-04-06 ENCOUNTER — Other Ambulatory Visit: Payer: Self-pay | Admitting: *Deleted

## 2022-04-06 DIAGNOSIS — D649 Anemia, unspecified: Secondary | ICD-10-CM

## 2022-04-06 DIAGNOSIS — C9310 Chronic myelomonocytic leukemia not having achieved remission: Secondary | ICD-10-CM

## 2022-04-07 ENCOUNTER — Inpatient Hospital Stay: Payer: 59 | Attending: Nurse Practitioner

## 2022-04-07 ENCOUNTER — Inpatient Hospital Stay: Payer: 59

## 2022-04-07 DIAGNOSIS — C9310 Chronic myelomonocytic leukemia not having achieved remission: Secondary | ICD-10-CM | POA: Diagnosis not present

## 2022-04-07 DIAGNOSIS — D630 Anemia in neoplastic disease: Secondary | ICD-10-CM | POA: Insufficient documentation

## 2022-04-07 DIAGNOSIS — D649 Anemia, unspecified: Secondary | ICD-10-CM

## 2022-04-07 LAB — HEMOGLOBIN AND HEMATOCRIT, BLOOD
HCT: 31.8 % — ABNORMAL LOW (ref 36.0–46.0)
Hemoglobin: 10.3 g/dL — ABNORMAL LOW (ref 12.0–15.0)

## 2022-04-07 NOTE — Progress Notes (Signed)
Hbg 10.3 today. No Aranesp needed

## 2022-04-11 ENCOUNTER — Other Ambulatory Visit: Payer: 59 | Admitting: Student

## 2022-04-13 ENCOUNTER — Other Ambulatory Visit: Payer: 59 | Admitting: Student

## 2022-04-13 DIAGNOSIS — F0150 Vascular dementia without behavioral disturbance: Secondary | ICD-10-CM

## 2022-04-13 DIAGNOSIS — Z515 Encounter for palliative care: Secondary | ICD-10-CM

## 2022-04-13 DIAGNOSIS — R52 Pain, unspecified: Secondary | ICD-10-CM

## 2022-04-13 DIAGNOSIS — C9310 Chronic myelomonocytic leukemia not having achieved remission: Secondary | ICD-10-CM

## 2022-04-13 NOTE — Progress Notes (Signed)
[facility] Palliative Care Consult Note Telephone: [phone]  Fax: [phone]   Date of encounter: 04/13/22 11:09 AM PATIENT NAME: Vickie Howard [address] [place] [address]   [id number] (home)  DOB: 05-23-1936 MRN: [id number] PRIMARY CARE PROVIDER:    Adin Hector, MD,  [address] [facility] [place] [address] [phone]  REFERRING PROVIDER:   Adin Hector, MD [facility] [place],  [place] [address] [phone]  RESPONSIBLE PARTY:    Contact Information     Name Relation Home Work Mobile   Ramos Daughter Ionia Son [phone]     Farlow,Fariha Daughter [phone]     Elsie Ra Daughter [phone]         I met face to face with patient and family in the home. Palliative Care was asked to follow this patient by consultation request of  Adin Hector, MD to address advance care planning and complex medical decision making. This is the initial visit.                                     ASSESSMENT AND PLAN / RECOMMENDATIONS:   Advance Care Planning/Goals of Care: Goals include to maximize quality of life and symptom management. Patient/health care surrogate gave his/her permission to discuss.Our advance care planning conversation included a discussion about:    The value and importance of advance care planning  Experiences with loved ones who have been seriously ill or have died  Exploration of personal, cultural or spiritual beliefs that might influence medical decisions  Exploration of goals of care in the event of a sudden injury or illness  Son Elycia Woodside is HCPOA Review and updating or creation of an  advance directive document CODE STATUS: DNR  Education provided on Palliative Medicine. Introduced MOST form; patient and family to review. Patient would like to remain in the home with  supportive care from family. Palliative Medicine will continue to provide supportive care, symptom management in the home.    Symptom Management/Plan:  Mixed Alzheimer's and Vascular dementia-encourage daily routine, reorient and redirect as needed. Monitor for falls/safety. Use rollator walker for ambulation. Family does pill box, needs reminder for evening medications. Has Life Alert. Will monitor for functional and cognitive declines.  CMML-continue routine lab work, Aranesp as directed. Follow up with oncology as scheduled.   Pain-patient with c/o right hand pain, shoulder, back pain. She completed prednisone recently; started on Plaquenil daily. Continue acetaminophen, Voltaren gel to hands as directed, Biofreeze to back, shoulders PRN. Encourage moist heat for up to 20 minutes TID PRN. Patient to follow up with rheumatology as scheduled. We discussed OT referral if she continues to have functional decline, difficulty with feeding herself.  Follow up Palliative Care Visit: Palliative care will continue to follow for complex medical decision making, advance care planning, and clarification of goals. Return 2-3 weeks or prn.  I spent 60 minutes providing this consultation. More than 50% of the time in this consultation was spent in counseling and care coordination.   PPS: 60%  HOSPICE ELIGIBILITY/DIAGNOSIS: TBD  Chief Complaint: Palliative Medicine initial consult visit.   HISTORY OF PRESENT ILLNESS:  Vickie Howard is a 86 y.o. year old female  with Mixed Alzheimer's and Vascular dementia, chronic myelomonocytic leukemia, hypertension, atrial fibrillation, osteoarthritis,  B12 deficiency,   Patient resides at home; she has supportive family who check on her daily.  Denies pain, occasional shortness of breath. Appetite is fair; drinking Boost, usually two a day. Still cooks for herself. Worsening arthritis, right hand pain. Recently completed prednisone, started on plaquenil daily.  Voltaren gel to hand, biofreeze to back, neck shoulders. Sometimes naps during the day; usually sleeps well at night. Occasional urinary tract infections; no recent infections. Receiving Aranesp, about every 6 weeks. Family endorse some sundowning; she sometimes forgets to take her evening medications. Family fills pill box.   History obtained from review of EMR, discussion with primary team, and interview with family, facility staff/caregiver and/or Ms. Saleeby.  I reviewed available labs, medications, imaging, studies and related documents from the EMR.  Records reviewed and summarized above.   ROS  A 10-Point ROS is negative, except for the pertinent positives and negatives detailed per the HPI.   Physical Exam: Pulse 60, resp 16, b/p 120/70, sats 96% on room air Constitutional: NAD General: frail appearing, thin EYES: anicteric sclera, lids intact, no discharge  ENMT: intact hearing, oral mucous membranes moist, dentition intact CV: S1S2, RRR, murmur, no LE edema Pulmonary: LCTA, no increased work of breathing, no cough, room air Abdomen: normo-active BS + 4 quadrants, soft and non tender, no ascites GU: deferred MSK: moves all extremities, ambulatory with rollator walker Skin: warm and dry, no rashes or wounds on visible skin Neuro: generalized weakness,  + cognitive impairment Psych: non-anxious affect, A and O x 3 Hem/lymph/immuno: no widespread bruising CURRENT PROBLEM LIST:  Patient Active Problem List   Diagnosis Date Noted   Osteoarthritis 12/30/2018   Osteoporosis 12/30/2018   Encounter for long-term (current) use of high-risk medication 66/44/0347   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   Seronegative arthritis 07/18/2018   CMML (chronic myelomonocytic leukemia) ([place]) 06/23/2018   Wrist swelling, right 05/21/2018   Thoracic aortic atherosclerosis ([place]) 01/08/2017   Thrombocytopenia ([employer]) 07/10/2016   Presence of permanent cardiac pacemaker  09/02/2015   Aortic stenosis, moderate 08/15/2015   Heart murmur 08/15/2015   Fracture of right inferior pubic ramus ([place]) 08/09/2015   Sacral fracture ([facility]) 08/09/2015   Syncope 08/09/2015   Junctional rhythm 08/09/2015   Bradycardia 08/09/2015   Leukocytosis 08/09/2015   Hyperkalemia 08/09/2015   Acute renal insufficiency 08/09/2015   Essential hypertension 08/09/2015   Pelvic fracture ([place]) 08/09/2015   Pelvic fracture, closed, initial encounter 08/09/2015   Pressure ulcer 08/09/2015   Atrial fibrillation with rapid ventricular response ([place]) 10/16/2014   Elevated troponin 10/16/2014   Atrial fibrillation ([place]) 10/16/2014   GERD without esophagitis 10/16/2014   HLD (hyperlipidemia) 10/16/2014   HTN (hypertension) 10/16/2014   Anemia, unspecified 10/16/2014   S/P total hip arthroplasty 10/12/2014   B12 deficiency 12/09/2013   PAST MEDICAL HISTORY:  Active Ambulatory Problems    Diagnosis Date Noted   S/P total hip arthroplasty 10/12/2014   Atrial fibrillation with rapid ventricular response ([place]) 10/16/2014   Elevated troponin 10/16/2014   Atrial fibrillation ([place]) 10/16/2014   GERD without esophagitis 10/16/2014   HLD (hyperlipidemia) 10/16/2014   HTN (hypertension) 10/16/2014   Anemia, unspecified 10/16/2014   Fracture of right inferior pubic ramus ([place]) 08/09/2015   Sacral fracture ([place]) 08/09/2015   Syncope 08/09/2015   Junctional rhythm 08/09/2015   Bradycardia 08/09/2015   Leukocytosis 08/09/2015   Hyperkalemia 08/09/2015   Acute renal insufficiency 08/09/2015   Essential hypertension 08/09/2015   Pelvic fracture ([place]) 08/09/2015   Pelvic  fracture, closed, initial encounter 08/09/2015   Pressure ulcer 08/09/2015   CMML (chronic myelomonocytic leukemia) ([place]) 23/30/0762   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   Aortic stenosis, moderate 08/15/2015   B12 deficiency 12/09/2013   Seronegative arthritis 07/18/2018   Encounter for  long-term (current) use of high-risk medication 10/23/2018   Heart murmur 08/15/2015   Osteoarthritis 12/30/2018   Osteoporosis 12/30/2018   Presence of permanent cardiac pacemaker 09/02/2015   Thoracic aortic atherosclerosis ([place]) 01/08/2017   Thrombocytopenia ([place]) 07/10/2016   Wrist swelling, right 05/21/2018   Resolved Ambulatory Problems    Diagnosis Date Noted   No Resolved Ambulatory Problems   Past Medical History:  Diagnosis Date   Anemia    Arthritis    GERD (gastroesophageal reflux disease)    History of hiatal hernia    Occasional tremors    SOCIAL HX:  Social History   Tobacco Use   Smoking status: Never   Smokeless tobacco: Never  Substance Use Topics   Alcohol use: No   FAMILY HX:  Family History  Problem Relation Age of Onset   Aneurysm Father    Breast cancer Daughter       ALLERGIES:  Allergies  Allergen Reactions   Codeine Sulfate Other (See Comments)    Reaction:  Syncope   Levaquin [Levofloxacin] Nausea And Vomiting   Oxycodone Other (See Comments)    Reaction:  Disoriented      PERTINENT MEDICATIONS:  Outpatient Encounter Medications as of 04/13/2022  Medication Sig   acetaminophen (TYLENOL) 500 MG tablet Take 1,000 mg by mouth every 6 (six) hours as needed for mild pain or headache.   aspirin EC 81 MG tablet Take 81 mg by mouth every other day.    atorvastatin (LIPITOR) 20 MG tablet Take 20 mg by mouth daily.   Cyanocobalamin (VITAMIN B 12 PO) Take 1 mcg by mouth daily.   docusate sodium (COLACE) 100 MG capsule Take 100 mg by mouth 2 (two) times daily.   donepezil (ARICEPT) 5 MG tablet Take 5 mg by mouth daily. (Patient not taking: Reported on 03/07/2022)   feeding supplement, ENSURE ENLIVE, (ENSURE ENLIVE) LIQD Take 237 mLs by mouth 2 (two) times daily between meals.   ferrous sulfate 325 (65 FE) MG tablet Take 325 mg by mouth daily with breakfast. (Patient not taking: Reported on 03/07/2022)   LANOXIN 62.5 MCG TABS TK 1 T PO ONCE D  (Patient not taking: Reported on 11/01/2021)   lisinopril (PRINIVIL,ZESTRIL) 2.5 MG tablet Take 2.5 mg by mouth daily.    Magnesium 200 MG TABS Take 200 mg by mouth daily.   mirtazapine (REMERON) 15 MG tablet Take 1 tablet by mouth 1 day or 1 dose.   Multiple Vitamin (MULTI-VITAMIN) tablet Take 1 tablet by mouth 1 day or 1 dose.   Multiple Vitamins-Minerals (PRESERVISION AREDS 2) CAPS Take 1 capsule by mouth daily.   pramipexole (MIRAPEX) 0.125 MG tablet Take by mouth.   sertraline (ZOLOFT) 50 MG tablet Take 75 mg by mouth daily. Patient taking 44m daily per Dr. KJens Som  tiZANidine (ZANAFLEX) 2 MG tablet Take 2 mg by mouth at bedtime. (Patient not taking: Reported on 03/07/2022)   No facility-administered encounter medications on file as of 04/13/2022.   Thank you for the opportunity to participate in the care of Ms. BDioguardi  The palliative care team will continue to follow. Please call our office at [phone] we can be of additional assistance.   LEzekiel Slocumb NP  COVID-19 PATIENT SCREENING TOOL Asked and negative response unless otherwise noted:  Have you had symptoms of covid, tested positive or been in contact with someone with symptoms/positive test in the past 5-10 days? No

## 2022-05-08 ENCOUNTER — Inpatient Hospital Stay: Payer: 59

## 2022-05-08 ENCOUNTER — Inpatient Hospital Stay: Payer: 59 | Attending: Nurse Practitioner

## 2022-05-08 VITALS — BP 145/58 | HR 66

## 2022-05-08 DIAGNOSIS — D649 Anemia, unspecified: Secondary | ICD-10-CM

## 2022-05-08 DIAGNOSIS — D630 Anemia in neoplastic disease: Secondary | ICD-10-CM | POA: Diagnosis present

## 2022-05-08 DIAGNOSIS — C9310 Chronic myelomonocytic leukemia not having achieved remission: Secondary | ICD-10-CM

## 2022-05-08 LAB — HEMOGLOBIN AND HEMATOCRIT, BLOOD
HCT: 28.9 % — ABNORMAL LOW (ref 36.0–46.0)
Hemoglobin: 9.4 g/dL — ABNORMAL LOW (ref 12.0–15.0)

## 2022-05-08 MED ORDER — DARBEPOETIN ALFA 200 MCG/0.4ML IJ SOSY
200.0000 ug | PREFILLED_SYRINGE | Freq: Once | INTRAMUSCULAR | Status: AC
  Administered 2022-05-08: 200 ug via SUBCUTANEOUS
  Filled 2022-05-08: qty 0.4

## 2022-06-07 ENCOUNTER — Inpatient Hospital Stay: Payer: 59 | Attending: Nurse Practitioner

## 2022-06-07 ENCOUNTER — Inpatient Hospital Stay: Payer: 59

## 2022-06-07 VITALS — BP 144/53 | HR 61

## 2022-06-07 DIAGNOSIS — C9310 Chronic myelomonocytic leukemia not having achieved remission: Secondary | ICD-10-CM

## 2022-06-07 DIAGNOSIS — D630 Anemia in neoplastic disease: Secondary | ICD-10-CM | POA: Diagnosis present

## 2022-06-07 DIAGNOSIS — D649 Anemia, unspecified: Secondary | ICD-10-CM

## 2022-06-07 LAB — CBC WITH DIFFERENTIAL/PLATELET
Abs Immature Granulocytes: 0.22 10*3/uL — ABNORMAL HIGH (ref 0.00–0.07)
Basophils Absolute: 0.1 10*3/uL (ref 0.0–0.1)
Basophils Relative: 1 %
Eosinophils Absolute: 0 10*3/uL (ref 0.0–0.5)
Eosinophils Relative: 0 %
HCT: 29.2 % — ABNORMAL LOW (ref 36.0–46.0)
Hemoglobin: 9.5 g/dL — ABNORMAL LOW (ref 12.0–15.0)
Immature Granulocytes: 1 %
Lymphocytes Relative: 9 %
Lymphs Abs: 1.4 10*3/uL (ref 0.7–4.0)
MCH: 33.5 pg (ref 26.0–34.0)
MCHC: 32.5 g/dL (ref 30.0–36.0)
MCV: 102.8 fL — ABNORMAL HIGH (ref 80.0–100.0)
Monocytes Absolute: 2.9 10*3/uL — ABNORMAL HIGH (ref 0.1–1.0)
Monocytes Relative: 19 %
Neutro Abs: 10.7 10*3/uL — ABNORMAL HIGH (ref 1.7–7.7)
Neutrophils Relative %: 70 %
Platelets: 76 10*3/uL — ABNORMAL LOW (ref 150–400)
RBC: 2.84 MIL/uL — ABNORMAL LOW (ref 3.87–5.11)
RDW: 16.4 % — ABNORMAL HIGH (ref 11.5–15.5)
Smear Review: NORMAL
WBC: 15.4 10*3/uL — ABNORMAL HIGH (ref 4.0–10.5)
nRBC: 0 % (ref 0.0–0.2)

## 2022-06-07 MED ORDER — DARBEPOETIN ALFA 200 MCG/0.4ML IJ SOSY
200.0000 ug | PREFILLED_SYRINGE | Freq: Once | INTRAMUSCULAR | Status: AC
  Administered 2022-06-07: 200 ug via SUBCUTANEOUS

## 2022-07-03 ENCOUNTER — Other Ambulatory Visit: Payer: 59

## 2022-07-03 DIAGNOSIS — Z515 Encounter for palliative care: Secondary | ICD-10-CM

## 2022-07-03 NOTE — Progress Notes (Signed)
                                                TELEPHONE CHECK IN/VISIT  PC SW connected with patients daughter, Vickie Howard, via telephone to complete telephonic visit/check in with patient and to schedule in person visit. Daughter shared that patient is status quo since previous PC visit.   Pain: patient continues to have pain in hands and L hand pinky and ring finger are contracting inward and L hand palm is starting to swell. Patient  has appt with Rheumatology on 2/8. SW and daughter discussed the possibility of OT.   Appetite: per daughter patients appetite is declining.   Dementia: continues to fluctuates daily some days her cognition is good and some days are better,  ADL: continues to live independently, bathes self in sink and dresses self, can cook meals with assistance and supervision, Daughter shares that patient is weaker now.  Leukemia cancer: still receiving infusions monthly        Palliative care will continue to follow. Next PC visit scheduled for wed 07/26/22 '@1pm'$  with RN\/SW.

## 2022-07-10 ENCOUNTER — Other Ambulatory Visit: Payer: Self-pay | Admitting: *Deleted

## 2022-07-10 DIAGNOSIS — R5382 Chronic fatigue, unspecified: Secondary | ICD-10-CM

## 2022-07-10 DIAGNOSIS — D649 Anemia, unspecified: Secondary | ICD-10-CM

## 2022-07-10 DIAGNOSIS — C9310 Chronic myelomonocytic leukemia not having achieved remission: Secondary | ICD-10-CM

## 2022-07-11 ENCOUNTER — Inpatient Hospital Stay: Payer: 59 | Attending: Nurse Practitioner

## 2022-07-11 ENCOUNTER — Inpatient Hospital Stay (HOSPITAL_BASED_OUTPATIENT_CLINIC_OR_DEPARTMENT_OTHER): Payer: 59 | Admitting: Oncology

## 2022-07-11 ENCOUNTER — Inpatient Hospital Stay: Payer: 59

## 2022-07-11 VITALS — BP 140/57 | HR 71 | Temp 97.5°F | Wt 94.5 lb

## 2022-07-11 DIAGNOSIS — C9310 Chronic myelomonocytic leukemia not having achieved remission: Secondary | ICD-10-CM

## 2022-07-11 DIAGNOSIS — R5382 Chronic fatigue, unspecified: Secondary | ICD-10-CM

## 2022-07-11 DIAGNOSIS — D696 Thrombocytopenia, unspecified: Secondary | ICD-10-CM | POA: Diagnosis not present

## 2022-07-11 DIAGNOSIS — D630 Anemia in neoplastic disease: Secondary | ICD-10-CM | POA: Insufficient documentation

## 2022-07-11 DIAGNOSIS — D649 Anemia, unspecified: Secondary | ICD-10-CM

## 2022-07-11 LAB — CBC WITH DIFFERENTIAL/PLATELET
Abs Immature Granulocytes: 0.11 10*3/uL — ABNORMAL HIGH (ref 0.00–0.07)
Basophils Absolute: 0.1 10*3/uL (ref 0.0–0.1)
Basophils Relative: 1 %
Eosinophils Absolute: 0 10*3/uL (ref 0.0–0.5)
Eosinophils Relative: 0 %
HCT: 30.6 % — ABNORMAL LOW (ref 36.0–46.0)
Hemoglobin: 9.9 g/dL — ABNORMAL LOW (ref 12.0–15.0)
Immature Granulocytes: 1 %
Lymphocytes Relative: 13 %
Lymphs Abs: 1.5 10*3/uL (ref 0.7–4.0)
MCH: 33.7 pg (ref 26.0–34.0)
MCHC: 32.4 g/dL (ref 30.0–36.0)
MCV: 104.1 fL — ABNORMAL HIGH (ref 80.0–100.0)
Monocytes Absolute: 2.6 10*3/uL — ABNORMAL HIGH (ref 0.1–1.0)
Monocytes Relative: 22 %
Neutro Abs: 7.9 10*3/uL — ABNORMAL HIGH (ref 1.7–7.7)
Neutrophils Relative %: 63 %
Platelets: 85 10*3/uL — ABNORMAL LOW (ref 150–400)
RBC: 2.94 MIL/uL — ABNORMAL LOW (ref 3.87–5.11)
RDW: 16.4 % — ABNORMAL HIGH (ref 11.5–15.5)
Smear Review: NORMAL
WBC: 12.3 10*3/uL — ABNORMAL HIGH (ref 4.0–10.5)
nRBC: 0 % (ref 0.0–0.2)

## 2022-07-11 LAB — COMPREHENSIVE METABOLIC PANEL
ALT: 23 U/L (ref 0–44)
AST: 33 U/L (ref 15–41)
Albumin: 4 g/dL (ref 3.5–5.0)
Alkaline Phosphatase: 49 U/L (ref 38–126)
Anion gap: 10 (ref 5–15)
BUN: 25 mg/dL — ABNORMAL HIGH (ref 8–23)
CO2: 25 mmol/L (ref 22–32)
Calcium: 9.3 mg/dL (ref 8.9–10.3)
Chloride: 104 mmol/L (ref 98–111)
Creatinine, Ser: 0.87 mg/dL (ref 0.44–1.00)
GFR, Estimated: >60 mL/min (ref >60)
Glucose, Bld: 94 mg/dL (ref 70–99)
Potassium: 3.9 mmol/L (ref 3.5–5.1)
Sodium: 139 mmol/L (ref 135–145)
Total Bilirubin: 0.5 mg/dL (ref 0.3–1.2)
Total Protein: 7.2 g/dL (ref 6.5–8.1)

## 2022-07-11 MED ORDER — DARBEPOETIN ALFA 200 MCG/0.4ML IJ SOSY
200.0000 ug | PREFILLED_SYRINGE | Freq: Once | INTRAMUSCULAR | Status: AC
  Administered 2022-07-11: 200 ug via SUBCUTANEOUS
  Filled 2022-07-11: qty 0.4

## 2022-07-11 NOTE — Progress Notes (Signed)
[facility]  [phone] [phone]  ID: Vickie Howard OB: 19-Nov-1935  MR#: [id number]  YTK#:[id number]  Patient Care Team: Adin Hector, MD as PCP - General (Internal Medicine) Lloyd Huger, MD as Consulting Physician (Oncology)  CHIEF COMPLAINT: CMML  INTERVAL HISTORY: Patient returns to clinic today for repeat laboratory work, further evaluation, and continuation of Retacrit.  She continues to have chronic weakness and fatigue, but otherwise feels well.  She has no neurologic complaints. She denies any recent fevers or illnesses. She has a good appetite and denies weight loss. She denies any chest pain, shortness of breath, cough, or hemoptysis. She denies any nausea, vomiting, constipation, or diarrhea.  She has no melena or hematochezia.  She has no urinary complaints.  Patient offers no further specific complaints today.  REVIEW OF SYSTEMS:   Review of Systems  Constitutional:  Positive for malaise/fatigue. Negative for fever and weight loss.  Respiratory: Negative.  Negative for cough, hemoptysis and shortness of breath.   Cardiovascular: Negative.  Negative for chest pain and leg swelling.  Gastrointestinal: Negative.  Negative for abdominal pain, blood in stool and melena.  Genitourinary: Negative.  Negative for hematuria.  Musculoskeletal: Negative.  Negative for back pain, falls and joint pain.  Skin: Negative.  Negative for rash.  Neurological:  Positive for weakness. Negative for dizziness, sensory change, focal weakness and headaches.  Psychiatric/Behavioral:  Positive for memory loss. The patient is not nervous/anxious.     As per HPI. Otherwise, a complete review of systems is negative.  PAST MEDICAL HISTORY: Past Medical History:  Diagnosis Date   Anemia    Arthritis    Atrial fibrillation (HCC)    GERD (gastroesophageal reflux disease)    Heart murmur    History of hiatal hernia    HLD (hyperlipidemia)     HTN (hypertension)    Occasional tremors    hands    PAST SURGICAL HISTORY: Past Surgical History:  Procedure Laterality Date   JOINT REPLACEMENT     right total hip 2015   Left total hip arthroplasty  10/12/2014   PACEMAKER INSERTION N/A 08/12/2015   Procedure: INSERTION PACEMAKER attempt unable to obtain access ;  Surgeon: Isaias Cowman, MD;  Location: [facility] ORS;  Service: Cardiovascular;  Laterality: N/A;   TOTAL HIP ARTHROPLASTY Left 10/12/2014   Procedure: TOTAL HIP ARTHROPLASTY;  Surgeon: Dereck Leep, MD;  Location: [facility] ORS;  Service: Orthopedics;  Laterality: Left;   TUBAL LIGATION      FAMILY HISTORY: Family History  Problem Relation Age of Onset   Aneurysm Father    Breast cancer Daughter     ADVANCED DIRECTIVES (Y/N):  N  HEALTH MAINTENANCE: Social History   Tobacco Use   Smoking status: Never   Smokeless tobacco: Never  Vaping Use   Vaping Use: Never used  Substance Use Topics   Alcohol use: No   Drug use: No     Colonoscopy:  PAP:  Bone density:  Lipid panel:  Allergies  Allergen Reactions   Codeine Sulfate Other (See Comments)    Reaction:  Syncope   Levaquin [Levofloxacin] Nausea And Vomiting   Oxycodone Other (See Comments)    Reaction:  Disoriented     Current Outpatient Medications  Medication Sig Dispense Refill   acetaminophen (TYLENOL) 500 MG tablet Take 1,000 mg by mouth every 6 (six) hours as needed for mild pain or headache.     aspirin EC 81 MG tablet Take 81 mg  by mouth every other day.      atorvastatin (LIPITOR) 20 MG tablet Take 20 mg by mouth daily.     cephALEXin (KEFLEX) 250 MG capsule Take 350 mg by mouth 3 (three) times daily.     Cyanocobalamin (VITAMIN B 12 PO) Take 1 mcg by mouth daily.     docusate sodium (COLACE) 100 MG capsule Take 100 mg by mouth 2 (two) times daily.     feeding supplement, ENSURE ENLIVE, (ENSURE ENLIVE) LIQD Take 237 mLs by mouth 2 (two) times daily between meals. 237 mL 12   hydroxychloroquine  (PLAQUENIL) 200 MG tablet Take 200 mg by mouth daily.     lisinopril (PRINIVIL,ZESTRIL) 2.5 MG tablet Take 2.5 mg by mouth daily.   1   mirtazapine (REMERON) 15 MG tablet 30 mg daily.     Multiple Vitamin (MULTI-VITAMIN) tablet Take 1 tablet by mouth 1 day or 1 dose.     Multiple Vitamins-Minerals (PRESERVISION AREDS 2) CAPS Take 1 capsule by mouth daily.     sertraline (ZOLOFT) 100 MG tablet Take 100 mg by mouth daily. Patient taking 100 mg daily.     donepezil (ARICEPT) 5 MG tablet Take 5 mg by mouth daily. (Patient not taking: Reported on 03/07/2022)     ferrous sulfate 325 (65 FE) MG tablet Take 325 mg by mouth daily with breakfast. (Patient not taking: Reported on 03/07/2022)     LANOXIN 62.5 MCG TABS TK 1 T PO ONCE D (Patient not taking: Reported on 11/01/2021)  11   Magnesium 200 MG TABS Take 200 mg by mouth daily. (Patient not taking: Reported on 07/11/2022)     pramipexole (MIRAPEX) 0.125 MG tablet Take by mouth. (Patient not taking: Reported on 07/11/2022)     tiZANidine (ZANAFLEX) 2 MG tablet Take 2 mg by mouth at bedtime. (Patient not taking: Reported on 03/07/2022)     No current facility-administered medications for this visit.    OBJECTIVE: Vitals:   07/11/22 1043 07/11/22 1044  BP: (!) 148/126 (!) 140/57  Pulse: 71   Temp: (!) 97.5 F (36.4 C)   SpO2: 100%      Body mass index is 19.09 kg/m.    ECOG FS:1 - Symptomatic but completely ambulatory  General: Well-developed, well-nourished, no acute distress.  Sitting in a wheel chair. Eyes: Pink conjunctiva, anicteric sclera. HEENT: Normocephalic, moist mucous membranes. Lungs: No audible wheezing or coughing. Heart: Regular rate and rhythm. Abdomen: Soft, nontender, no obvious distention. Musculoskeletal: No edema, cyanosis, or clubbing. Neuro: Alert, answering all questions appropriately. Cranial nerves grossly intact. Skin: No rashes or petechiae noted. Psych: Normal affect.  LAB RESULTS:  Lab Results  Component Value  Date   NA 139 07/11/2022   K 3.9 07/11/2022   CL 104 07/11/2022   CO2 25 07/11/2022   GLUCOSE 94 07/11/2022   BUN 25 (H) 07/11/2022   CREATININE 0.87 07/11/2022   CALCIUM 9.3 07/11/2022   PROT 7.2 07/11/2022   ALBUMIN 4.0 07/11/2022   AST 33 07/11/2022   ALT 23 07/11/2022   ALKPHOS 49 07/11/2022   BILITOT 0.5 07/11/2022   GFRNONAA >60 07/11/2022   GFRAA >60 02/25/2020    Lab Results  Component Value Date   WBC 12.3 (H) 07/11/2022   NEUTROABS 7.9 (H) 07/11/2022   HGB 9.9 (L) 07/11/2022   HCT 30.6 (L) 07/11/2022   MCV 104.1 (H) 07/11/2022   PLT 85 (L) 07/11/2022     STUDIES: No results found.  ASSESSMENT: CMML.  PLAN:  CMML: Confirmed on bone marrow biopsy from June 18, 2018.  Patient noted to have a hypercellular marrow with mild dysplasia in all 3 lineages.  No increased blasts.  Patient's white blood cell count remains chronically elevated ranging between 10.7 and 16.9 since February 2022.  Today's result is 12.3.  Her absolute monocyte level has ranged between 1.7 and 3.2 since March 2020.  Today's result is 2.6.  Given her advanced age and she is asymptomatic, she does not require treatment at this time.  Can consider Vidaza or Dacogen in the future if absolutely necessary, but patient has indicated she would likely decline treatment.   Anemia: Possibly secondary to CMML.  Hemoglobin is 9.9 today.  Proceed with Retacrit as ordered.  Return to clinic in 1, 2, 3 months for laboratory work and Retacrit only if her hemoglobin remains below 10.0.  Patient will then return to clinic in 4 months with repeat laboratory work, further evaluation, and continuation of treatment if needed.   Elevated MCV: Chronic and unchanged.  B12 and folate are within normal limits.  Secondary to CMML.   Thrombocytopenia: Chronic and unchanged.  Patient's platelet count has ranged from 76-145 since February 2020.  Today's result is 85.    Patient expressed understanding and was in agreement  with this plan. She also understands that She can call clinic at any time with any questions, concerns, or complaints.    Lloyd Huger, MD   07/11/2022 12:10 PM

## 2022-07-26 ENCOUNTER — Other Ambulatory Visit: Payer: 59

## 2022-07-26 VITALS — BP 118/76 | HR 84 | Temp 97.7°F | Wt 92.0 lb

## 2022-07-26 DIAGNOSIS — Z515 Encounter for palliative care: Secondary | ICD-10-CM

## 2022-07-26 NOTE — Progress Notes (Signed)
COMMUNITY PALLIATIVE CARE SW NOTE  PATIENT NAME: Vickie Howard DOB: 05-03-1936 MRN: [id number]  PRIMARY CARE PROVIDER: Adin Hector, MD  RESPONSIBLE PARTY:  Acct ID - Guarantor Home Phone Work Phone Relationship Acct Type  [id number] Vickie Howard, [id number]  Self P/F     [address], [place], [place] [address]     PLAN OF CARE and INTERVENTIONS:              GOALS OF CARE/ ADVANCE CARE PLANNING:    Goals include to maximize quality of life and assist with pain management. Our advance care planning conversation included a discussion about:    The value and importance of advance care planning  Review and updating or creation of an advance directive document.                          Code status: wishes to be DNR.                          Advance directives: Patient has living will.  2.        SOCIAL/EMOTIONAL/SPIRITUAL ASSESSMENT/ INTERVENTIONS:         Palliative care encounter: SW and RN completed joint in home visit with patient, son Coralyn Mark and daughter Kalman Shan,   Funcitional changes/updates: Patient last seen by [facility] NP, no significant declines since that visit.  Rheumetoid arthritis: patient continues to have pain in hands and L hand pinky and ring finger are contracting inward and L hand palm is starting to swell. Patient saw Rheumatology on 2/8, who suggested OT. Patient is inagreemtent with this and open to [facility] OT. SW outreached rheumatology office to request [facility] OT orders be sent to [facility] or other [facility] agency that will accept patients insurance.    Appetite: patient states her appetite is fair. She eats 2-3 meals a day and drinks ensure supplements. Patient continues to cook her own meals.   Dementia: continues to fluctuates daily, but is not imoeding on her daily functions.    ADL: continues to live independently, bathes self in sink and dresses self, can cook meals with. No falls reported. Patient uses rollator.   Leukemia cancer: receives infusions Q 4  weeks.   Psychosocial assessment: completed.   In home support: patient resides in one story townhome independently, family/children check on her daily.    Transportation: no needs.  Food: no food insecurities witnessed.   Safety and long term planning: patient feels safe in her home and desires to remain in her home. Patient has life alert and family has placed camers in her home for monitoring and safety.   SW discussed goals, reviewed care plan, provided emotional support, used active and reflective listening in the form of reciprocity emotional response. Questions and concerns were addressed. The patient/family was encouraged to call with any additional questions and/or concerns. PC Provided general support and encouragement, no other unmet needs identified. Will continue to follow.   3.         PATIENT/CAREGIVER EDUCATION/ COPING:   Appearance: well groomed, appropriate given situation  Mental Status: Alert/oriented. Eye Contact: Good. Able to engage in proper eye contact  Thought Process: rational  Thought Content: not assessed  Speech: normal  Mood: Normal and calm Affect: Congruent to endorsed mood, full ranging Insight: normal Judgement: normal  Interaction Style: Cooperative   Patient A&O and able to make needs known, patient engaged in  fluent conversation and answered all questions appropriately. No cognitive deficits witnessed this visit. Patient deny anxiety and depression. PHQ-9: 0.   4.         PERSONAL EMERGENCY PLAN:  Patient will call 9-1-1 for emergencies.    5.         COMMUNITY RESOURCES COORDINATION/ HEALTH CARE NAVIGATION:  patients children manages her care.    6.      FINANCIAL CONCERNS/NEEDS: None                         Primary Health Insurance: [facility] Medicare Secondary Health Insurance: Medicaid Prescription Coverage: Yes, no history of difficulty obtaining or affording prescriptions reported.     SOCIAL HX:  Social History   Tobacco Use   Smoking  status: Never   Smokeless tobacco: Never  Substance Use Topics   Alcohol use: No    CODE STATUS: DNR ADVANCED DIRECTIVES: Y MOST FORM COMPLETE:  N HOSPICE EDUCATION PROVIDED: N  [id number] is (I) with ADL's.  Time spent: 45 min      [place], [place]mao

## 2022-07-26 NOTE — Progress Notes (Addendum)
PATIENT NAME: Vickie Howard DOB: 03/23/1936 MRN: [id number]  PRIMARY CARE PROVIDER: Adin Hector, MD  RESPONSIBLE PARTY:  Acct ID - Guarantor Home Phone Work Phone Relationship Acct Type  [id number] AIRIN, [id number]  Self P/F     [address], [place], [place] [address]    Home visit completed with patient, daughter and son Coralyn Mark.  Arthritic Hands:  Discussed possible OT to to assist with exercise and adaptive equipment as patient is mostly independent at home.   Functional Status:  Patient is living independently with children coming in routinely to check on her.  She continues to cook her own meals.  Eating 3 meals a day and drinking a supplemental drink.  Patient is doing sponge baths, no longer doing showers.  She is continent of bowel and bladder.  Wearing depends for protection.  Patient has  rollator she uses.  No recent falls reported.  Family is doing grocery shopping and household chores.  Palliative Care:  Reviewed roll of PC with patient and family.  They are open to continuing with services at this time.   UTI:  Recently treated for UTI.  Patient reports symptoms are typically frequency and dark, cloudy urine.  Family notes a change in patient's demeanor when infection occurs.  No current symptoms.       CODE STATUS: DNR-uploaded to [facility] ADVANCED DIRECTIVES: Yes MOST FORM: No PPS: 50%   PHYSICAL EXAM:   VITALS: Today's Vitals   07/26/22 1314  BP: 118/76  Pulse: 84  Temp: 97.7 F (36.5 C)  SpO2: 98%  Weight: 92 lb (41.7 kg)    LUNGS: clear to auscultation  CARDIAC: Murmur, regular rate EXTREMITIES: - for edema SKIN: Skin color, texture, turgor normal. No rashes or lesions or mobility and turgor normal  NEURO: positive for gait problems and memory problems       Lorenza Burton, RN

## 2022-08-09 ENCOUNTER — Inpatient Hospital Stay: Payer: 59 | Attending: Nurse Practitioner

## 2022-08-09 ENCOUNTER — Inpatient Hospital Stay: Payer: 59

## 2022-08-09 DIAGNOSIS — D630 Anemia in neoplastic disease: Secondary | ICD-10-CM | POA: Insufficient documentation

## 2022-08-09 DIAGNOSIS — C9310 Chronic myelomonocytic leukemia not having achieved remission: Secondary | ICD-10-CM | POA: Diagnosis not present

## 2022-08-09 LAB — CBC WITH DIFFERENTIAL/PLATELET
Abs Immature Granulocytes: 0.16 10*3/uL — ABNORMAL HIGH (ref 0.00–0.07)
Basophils Absolute: 0.1 10*3/uL (ref 0.0–0.1)
Basophils Relative: 1 %
Eosinophils Absolute: 0 10*3/uL (ref 0.0–0.5)
Eosinophils Relative: 0 %
HCT: 32.8 % — ABNORMAL LOW (ref 36.0–46.0)
Hemoglobin: 10.6 g/dL — ABNORMAL LOW (ref 12.0–15.0)
Immature Granulocytes: 1 %
Lymphocytes Relative: 9 %
Lymphs Abs: 1.4 10*3/uL (ref 0.7–4.0)
MCH: 33.9 pg (ref 26.0–34.0)
MCHC: 32.3 g/dL (ref 30.0–36.0)
MCV: 104.8 fL — ABNORMAL HIGH (ref 80.0–100.0)
Monocytes Absolute: 2.9 10*3/uL — ABNORMAL HIGH (ref 0.1–1.0)
Monocytes Relative: 20 %
Neutro Abs: 10.1 10*3/uL — ABNORMAL HIGH (ref 1.7–7.7)
Neutrophils Relative %: 69 %
Platelets: 86 10*3/uL — ABNORMAL LOW (ref 150–400)
RBC: 3.13 MIL/uL — ABNORMAL LOW (ref 3.87–5.11)
RDW: 17.2 % — ABNORMAL HIGH (ref 11.5–15.5)
Smear Review: NORMAL
WBC: 14.6 10*3/uL — ABNORMAL HIGH (ref 4.0–10.5)
nRBC: 0 % (ref 0.0–0.2)

## 2022-08-09 NOTE — Progress Notes (Signed)
Retacrit held today. Hgb 10.6

## 2022-08-17 ENCOUNTER — Telehealth: Payer: Self-pay

## 2022-08-17 NOTE — Telephone Encounter (Signed)
Telephone call to patients PCP to request [facility] OT orders as family/patient has not heard anything on the service since Palliative care previous visit.

## 2022-08-21 ENCOUNTER — Other Ambulatory Visit: Payer: 59

## 2022-08-21 VITALS — BP 136/80 | HR 60 | Temp 97.4°F

## 2022-08-21 DIAGNOSIS — Z515 Encounter for palliative care: Secondary | ICD-10-CM

## 2022-08-21 NOTE — Progress Notes (Signed)
PATIENT NAME: Vickie Howard DOB: 1936/05/04 MRN: [id number]  PRIMARY CARE PROVIDER: Adin Hector, MD  RESPONSIBLE PARTY:  Acct ID - Guarantor Home Phone Work Phone Relationship Acct Type  [id number] KEYLA, [id number]  Self P/F     [address], [place], [place] [address]   Home visit completed with patient and daughters [name] and Vickii Chafe.  OT:  Updated on request.   Will need to pair with another therapy such as PT per Medicaid guidelines.  SW has already requested.   Alvis Lemmings has also declined due to current insurance.   Tearfulness:  Patient tearful during parts of this visit.  She is grateful for the support of her children but still is concerned/fearful about having to be placed in a facility.  Patient shared her mother was in a nursing home for 16 years before she died.   Daughters assured patient they are doing everything possible to keep patient at home.    Weight:  Remains stable at 92 lbs.  Patient denies any changes in her eating.  [name] shares patient has a hiatal hernia and thus eats small meals but will snack in the evening.  Patient is taking Boost juice-starts and lunch time and finishes by dinner.    Patient to follow up with Cardiology next week.  No new concerns voiced by patient or daughters.   CODE STATUS: DNR ADVANCED DIRECTIVES: Yes MOST FORM: No PPS: 50%  PHYSICAL EXAM:   VITALS: Today's Vitals   08/21/22 1716  BP: 136/80  Pulse: 60  Temp: (!) 97.4 F (36.3 C)  SpO2: 96%    LUNGS: clear to auscultation  CARDIAC: Murmur EXTREMITIES: trace edema-encouraged elevation of legs while sitting in recliner chair.  SKIN: Skin color, texture, turgor normal. No rashes or lesions or mobility and turgor normal  NEURO: positive for gait problems       Lorenza Burton, RN

## 2022-08-22 ENCOUNTER — Telehealth: Payer: Self-pay

## 2022-08-22 NOTE — Telephone Encounter (Signed)
Telephone call to patients PCP to have home health PT added to original order of home health OT.

## 2022-09-02 ENCOUNTER — Inpatient Hospital Stay: Payer: 59

## 2022-09-02 ENCOUNTER — Inpatient Hospital Stay
Admission: EM | Admit: 2022-09-02 | Discharge: 2022-09-05 | DRG: 871 | Disposition: A | Discharge status: Hospice | Payer: 59 | Attending: Internal Medicine | Admitting: Internal Medicine

## 2022-09-02 ENCOUNTER — Emergency Department: Payer: 59

## 2022-09-02 DIAGNOSIS — K750 Abscess of liver: Secondary | ICD-10-CM | POA: Diagnosis present

## 2022-09-02 DIAGNOSIS — Z79890 Hormone replacement therapy (postmenopausal): Secondary | ICD-10-CM

## 2022-09-02 DIAGNOSIS — N179 Acute kidney failure, unspecified: Secondary | ICD-10-CM

## 2022-09-02 DIAGNOSIS — C9310 Chronic myelomonocytic leukemia not having achieved remission: Secondary | ICD-10-CM | POA: Diagnosis present

## 2022-09-02 DIAGNOSIS — I21A1 Myocardial infarction type 2: Secondary | ICD-10-CM | POA: Diagnosis present

## 2022-09-02 DIAGNOSIS — R000 Tachycardia, unspecified: Secondary | ICD-10-CM | POA: Diagnosis present

## 2022-09-02 DIAGNOSIS — R6521 Severe sepsis with septic shock: Secondary | ICD-10-CM | POA: Diagnosis not present

## 2022-09-02 DIAGNOSIS — Z681 Body mass index (BMI) 19 or less, adult: Secondary | ICD-10-CM | POA: Diagnosis not present

## 2022-09-02 DIAGNOSIS — I472 Ventricular tachycardia, unspecified: Secondary | ICD-10-CM | POA: Diagnosis present

## 2022-09-02 DIAGNOSIS — K8000 Calculus of gallbladder with acute cholecystitis without obstruction: Secondary | ICD-10-CM | POA: Diagnosis present

## 2022-09-02 DIAGNOSIS — I4891 Unspecified atrial fibrillation: Secondary | ICD-10-CM | POA: Diagnosis not present

## 2022-09-02 DIAGNOSIS — E440 Moderate protein-calorie malnutrition: Secondary | ICD-10-CM | POA: Diagnosis present

## 2022-09-02 DIAGNOSIS — Z950 Presence of cardiac pacemaker: Secondary | ICD-10-CM

## 2022-09-02 DIAGNOSIS — Z96643 Presence of artificial hip joint, bilateral: Secondary | ICD-10-CM | POA: Diagnosis present

## 2022-09-02 DIAGNOSIS — Z881 Allergy status to other antibiotic agents status: Secondary | ICD-10-CM

## 2022-09-02 DIAGNOSIS — K819 Cholecystitis, unspecified: Secondary | ICD-10-CM | POA: Diagnosis not present

## 2022-09-02 DIAGNOSIS — I272 Pulmonary hypertension, unspecified: Secondary | ICD-10-CM | POA: Diagnosis present

## 2022-09-02 DIAGNOSIS — E872 Acidosis, unspecified: Secondary | ICD-10-CM | POA: Diagnosis present

## 2022-09-02 DIAGNOSIS — R570 Cardiogenic shock: Secondary | ICD-10-CM | POA: Diagnosis not present

## 2022-09-02 DIAGNOSIS — I480 Paroxysmal atrial fibrillation: Secondary | ICD-10-CM | POA: Diagnosis present

## 2022-09-02 DIAGNOSIS — Z803 Family history of malignant neoplasm of breast: Secondary | ICD-10-CM

## 2022-09-02 DIAGNOSIS — I509 Heart failure, unspecified: Secondary | ICD-10-CM | POA: Diagnosis not present

## 2022-09-02 DIAGNOSIS — I110 Hypertensive heart disease with heart failure: Secondary | ICD-10-CM | POA: Diagnosis present

## 2022-09-02 DIAGNOSIS — E039 Hypothyroidism, unspecified: Secondary | ICD-10-CM | POA: Diagnosis present

## 2022-09-02 DIAGNOSIS — I495 Sick sinus syndrome: Secondary | ICD-10-CM | POA: Diagnosis present

## 2022-09-02 DIAGNOSIS — Z8249 Family history of ischemic heart disease and other diseases of the circulatory system: Secondary | ICD-10-CM

## 2022-09-02 DIAGNOSIS — Z885 Allergy status to narcotic agent status: Secondary | ICD-10-CM

## 2022-09-02 DIAGNOSIS — A419 Sepsis, unspecified organism: Principal | ICD-10-CM | POA: Diagnosis present

## 2022-09-02 DIAGNOSIS — E871 Hypo-osmolality and hyponatremia: Secondary | ICD-10-CM | POA: Diagnosis not present

## 2022-09-02 DIAGNOSIS — I959 Hypotension, unspecified: Secondary | ICD-10-CM

## 2022-09-02 DIAGNOSIS — Z515 Encounter for palliative care: Secondary | ICD-10-CM

## 2022-09-02 DIAGNOSIS — M1990 Unspecified osteoarthritis, unspecified site: Secondary | ICD-10-CM | POA: Diagnosis present

## 2022-09-02 DIAGNOSIS — M069 Rheumatoid arthritis, unspecified: Secondary | ICD-10-CM | POA: Diagnosis present

## 2022-09-02 DIAGNOSIS — Z66 Do not resuscitate: Secondary | ICD-10-CM | POA: Diagnosis present

## 2022-09-02 DIAGNOSIS — Z7982 Long term (current) use of aspirin: Secondary | ICD-10-CM

## 2022-09-02 DIAGNOSIS — I350 Nonrheumatic aortic (valve) stenosis: Secondary | ICD-10-CM | POA: Diagnosis present

## 2022-09-02 DIAGNOSIS — Z79899 Other long term (current) drug therapy: Secondary | ICD-10-CM

## 2022-09-02 DIAGNOSIS — I447 Left bundle-branch block, unspecified: Secondary | ICD-10-CM | POA: Diagnosis present

## 2022-09-02 DIAGNOSIS — D649 Anemia, unspecified: Secondary | ICD-10-CM

## 2022-09-02 DIAGNOSIS — R188 Other ascites: Secondary | ICD-10-CM | POA: Diagnosis present

## 2022-09-02 DIAGNOSIS — D696 Thrombocytopenia, unspecified: Secondary | ICD-10-CM | POA: Diagnosis present

## 2022-09-02 DIAGNOSIS — Z7189 Other specified counseling: Secondary | ICD-10-CM | POA: Diagnosis not present

## 2022-09-02 DIAGNOSIS — R54 Age-related physical debility: Secondary | ICD-10-CM | POA: Diagnosis present

## 2022-09-02 DIAGNOSIS — E785 Hyperlipidemia, unspecified: Secondary | ICD-10-CM | POA: Diagnosis present

## 2022-09-02 DIAGNOSIS — K219 Gastro-esophageal reflux disease without esophagitis: Secondary | ICD-10-CM | POA: Diagnosis present

## 2022-09-02 LAB — CBC
HCT: 27.7 % — ABNORMAL LOW (ref 36.0–46.0)
Hemoglobin: 8.6 g/dL — ABNORMAL LOW (ref 12.0–15.0)
MCH: 34 pg (ref 26.0–34.0)
MCHC: 31 g/dL (ref 30.0–36.0)
MCV: 109.5 fL — ABNORMAL HIGH (ref 80.0–100.0)
Platelets: 65 10*3/uL — ABNORMAL LOW (ref 150–400)
RBC: 2.53 MIL/uL — ABNORMAL LOW (ref 3.87–5.11)
RDW: 17.4 % — ABNORMAL HIGH (ref 11.5–15.5)
WBC: 41.2 10*3/uL — ABNORMAL HIGH (ref 4.0–10.5)
nRBC: 0.1 % (ref 0.0–0.2)

## 2022-09-02 LAB — RESPIRATORY PANEL BY PCR

## 2022-09-02 LAB — COMPREHENSIVE METABOLIC PANEL
ALT: 376 U/L — ABNORMAL HIGH (ref 0–44)
AST: 441 U/L — ABNORMAL HIGH (ref 15–41)
Albumin: 2.4 g/dL — ABNORMAL LOW (ref 3.5–5.0)
Alkaline Phosphatase: 81 U/L (ref 38–126)
Anion gap: 14 (ref 5–15)
BUN: 26 mg/dL — ABNORMAL HIGH (ref 8–23)
CO2: 14 mmol/L — ABNORMAL LOW (ref 22–32)
Calcium: 7.2 mg/dL — ABNORMAL LOW (ref 8.9–10.3)
Chloride: 114 mmol/L — ABNORMAL HIGH (ref 98–111)
Creatinine, Ser: 0.83 mg/dL (ref 0.44–1.00)
GFR, Estimated: >60 mL/min (ref >60)
Glucose, Bld: 94 mg/dL (ref 70–99)
Potassium: 3.1 mmol/L — ABNORMAL LOW (ref 3.5–5.1)
Sodium: 142 mmol/L (ref 135–145)
Total Bilirubin: 0.9 mg/dL (ref 0.3–1.2)
Total Protein: 4.7 g/dL — ABNORMAL LOW (ref 6.5–8.1)

## 2022-09-02 LAB — LACTIC ACID, PLASMA
Lactic Acid, Venous: 5.7 mmol/L (ref 0.5–1.9)
Lactic Acid, Venous: 3.2 mmol/L (ref 0.5–1.9)
Lactic Acid, Venous: 2.8 mmol/L (ref 0.5–1.9)
Lactic Acid, Venous: 3 mmol/L (ref 0.5–1.9)
Lactic Acid, Venous: 3.1 mmol/L (ref 0.5–1.9)

## 2022-09-02 LAB — TROPONIN I (HIGH SENSITIVITY)
Troponin I (High Sensitivity): 110 ng/L (ref <18)
Troponin I (High Sensitivity): 356 ng/L (ref <18)
Troponin I (High Sensitivity): 9141 ng/L (ref <18)
Troponin I (High Sensitivity): >24000 ng/L (ref <18)

## 2022-09-02 LAB — RETICULOCYTES
Immature Retic Fract: 23.6 % — ABNORMAL HIGH (ref 2.3–15.9)
RBC.: 2.48 MIL/uL — ABNORMAL LOW (ref 3.87–5.11)
Retic Count, Absolute: 47.6 10*3/uL (ref 19.0–186.0)
Retic Ct Pct: 1.9 % (ref 0.4–3.1)

## 2022-09-02 LAB — MAGNESIUM
Magnesium: 1.6 mg/dL — ABNORMAL LOW (ref 1.7–2.4)
Magnesium: 2.2 mg/dL (ref 1.7–2.4)

## 2022-09-02 LAB — URINALYSIS, COMPLETE (UACMP) WITH MICROSCOPIC
Bilirubin Urine: NEGATIVE
Glucose, UA: NEGATIVE mg/dL
Ketones, ur: NEGATIVE mg/dL
Nitrite: NEGATIVE
Protein, ur: >=300 mg/dL — AB
Specific Gravity, Urine: 1.042 — ABNORMAL HIGH (ref 1.005–1.030)
pH: 5 (ref 5.0–8.0)

## 2022-09-02 LAB — BASIC METABOLIC PANEL
Anion gap: 15 (ref 5–15)
BUN: 32 mg/dL — ABNORMAL HIGH (ref 8–23)
CO2: 19 mmol/L — ABNORMAL LOW (ref 22–32)
Calcium: 9 mg/dL (ref 8.9–10.3)
Chloride: 104 mmol/L (ref 98–111)
Creatinine, Ser: 1.17 mg/dL — ABNORMAL HIGH (ref 0.44–1.00)
GFR, Estimated: 46 mL/min — ABNORMAL LOW (ref >60)
Glucose, Bld: 113 mg/dL — ABNORMAL HIGH (ref 70–99)
Potassium: 4.2 mmol/L (ref 3.5–5.1)
Sodium: 138 mmol/L (ref 135–145)

## 2022-09-02 LAB — CBC WITH DIFFERENTIAL/PLATELET
Abs Immature Granulocytes: 0.97 10*3/uL — ABNORMAL HIGH (ref 0.00–0.07)
Basophils Absolute: 0.1 10*3/uL (ref 0.0–0.1)
Basophils Relative: 0 %
Eosinophils Absolute: 0 10*3/uL (ref 0.0–0.5)
Eosinophils Relative: 0 %
HCT: 22.4 % — ABNORMAL LOW (ref 36.0–46.0)
Hemoglobin: 6.9 g/dL — ABNORMAL LOW (ref 12.0–15.0)
Immature Granulocytes: 4 %
Lymphocytes Relative: 5 %
Lymphs Abs: 1.4 10*3/uL (ref 0.7–4.0)
MCH: 34.2 pg — ABNORMAL HIGH (ref 26.0–34.0)
MCHC: 30.8 g/dL (ref 30.0–36.0)
MCV: 110.9 fL — ABNORMAL HIGH (ref 80.0–100.0)
Monocytes Absolute: 6.7 10*3/uL — ABNORMAL HIGH (ref 0.1–1.0)
Monocytes Relative: 25 %
Neutro Abs: 18.1 10*3/uL — ABNORMAL HIGH (ref 1.7–7.7)
Neutrophils Relative %: 66 %
Platelets: 56 10*3/uL — ABNORMAL LOW (ref 150–400)
RBC: 2.02 MIL/uL — ABNORMAL LOW (ref 3.87–5.11)
RDW: 17.3 % — ABNORMAL HIGH (ref 11.5–15.5)
WBC: 27.2 10*3/uL — ABNORMAL HIGH (ref 4.0–10.5)
nRBC: 0.1 % (ref 0.0–0.2)

## 2022-09-02 LAB — PREPARE RBC (CROSSMATCH)

## 2022-09-02 LAB — PROTIME-INR
INR: 1.5 — ABNORMAL HIGH (ref 0.8–1.2)
Prothrombin Time: 18.4 seconds — ABNORMAL HIGH (ref 11.4–15.2)

## 2022-09-02 LAB — HEPARIN LEVEL (UNFRACTIONATED): Heparin Unfractionated: 0.21 IU/mL — ABNORMAL LOW (ref 0.30–0.70)

## 2022-09-02 LAB — D-DIMER, QUANTITATIVE: D-Dimer, Quant: 9.01 ug/mL-FEU — ABNORMAL HIGH (ref 0.00–0.50)

## 2022-09-02 LAB — GLUCOSE, CAPILLARY: Glucose-Capillary: 131 mg/dL — ABNORMAL HIGH (ref 70–99)

## 2022-09-02 LAB — IRON AND TIBC
Iron: 52 ug/dL (ref 28–170)
Saturation Ratios: 32 % — ABNORMAL HIGH (ref 10.4–31.8)
TIBC: 165 ug/dL — ABNORMAL LOW (ref 250–450)
UIBC: 113 ug/dL

## 2022-09-02 LAB — TSH: TSH: 7.263 u[IU]/mL — ABNORMAL HIGH (ref 0.350–4.500)

## 2022-09-02 LAB — APTT: aPTT: 38 seconds — ABNORMAL HIGH (ref 24–36)

## 2022-09-02 LAB — T4, FREE: Free T4: 1.1 ng/dL (ref 0.61–1.12)

## 2022-09-02 LAB — VITAMIN B12: Vitamin B-12: 4221 pg/mL — ABNORMAL HIGH (ref 180–914)

## 2022-09-02 LAB — PHOSPHORUS: Phosphorus: 4.6 mg/dL (ref 2.5–4.6)

## 2022-09-02 LAB — FOLATE: Folate: >40 ng/mL (ref >5.9)

## 2022-09-02 LAB — BRAIN NATRIURETIC PEPTIDE: B Natriuretic Peptide: 976 pg/mL — ABNORMAL HIGH (ref 0.0–100.0)

## 2022-09-02 LAB — MRSA NEXT GEN BY PCR, NASAL: MRSA by PCR Next Gen: NOT DETECTED

## 2022-09-02 LAB — PROCALCITONIN: Procalcitonin: 0.23 ng/mL

## 2022-09-02 LAB — FERRITIN: Ferritin: 3558 ng/mL — ABNORMAL HIGH (ref 11–307)

## 2022-09-02 MED ORDER — DOCUSATE SODIUM 100 MG PO CAPS: 100.0000 mg | ORAL_CAPSULE | Freq: Two times a day (BID) | ORAL | Status: DC | PRN

## 2022-09-02 MED ORDER — LEVOTHYROXINE SODIUM 25 MCG PO TABS
25.0000 ug | ORAL_TABLET | Freq: Every day | ORAL | Status: DC
  Administered 2022-09-04: 25 ug via ORAL
  Filled 2022-09-02 (×2): qty 1

## 2022-09-02 MED ORDER — POTASSIUM CHLORIDE 10 MEQ/50ML IV SOLN: 10.0000 meq | INTRAVENOUS | Status: DC

## 2022-09-02 MED ORDER — AMIODARONE HCL IN DEXTROSE 360-4.14 MG/200ML-% IV SOLN
60.0000 mg/h | INTRAVENOUS | Status: AC
  Administered 2022-09-02 (×2): 60 mg/h via INTRAVENOUS
  Filled 2022-09-02 (×2): qty 200

## 2022-09-02 MED ORDER — HEPARIN SODIUM (PORCINE) 5000 UNIT/ML IJ SOLN: 5000.0000 [IU] | Freq: Three times a day (TID) | INTRAMUSCULAR | Status: DC

## 2022-09-02 MED ORDER — SODIUM CHLORIDE 0.9 % IV SOLN
INTRAVENOUS | Status: DC
  Administered 2022-09-04: 50 mL via INTRAVENOUS

## 2022-09-02 MED ORDER — IOHEXOL 300 MG/ML  SOLN
100.0000 mL | Freq: Once | INTRAMUSCULAR | Status: AC | PRN
  Administered 2022-09-02: 75 mL via INTRAVENOUS

## 2022-09-02 MED ORDER — PHENYLEPHRINE HCL-NACL 20-0.9 MG/250ML-% IV SOLN
0.0000 ug/min | INTRAVENOUS | Status: DC
  Administered 2022-09-02: 50 ug/min via INTRAVENOUS
  Filled 2022-09-02: qty 250

## 2022-09-02 MED ORDER — MAGNESIUM OXIDE -MG SUPPLEMENT 400 (240 MG) MG PO TABS
200.0000 mg | ORAL_TABLET | Freq: Every day | ORAL | Status: DC
  Filled 2022-09-02: qty 1

## 2022-09-02 MED ORDER — HEPARIN BOLUS VIA INFUSION
500.0000 [IU] | Freq: Once | INTRAVENOUS | Status: AC
  Administered 2022-09-02: 500 [IU] via INTRAVENOUS
  Filled 2022-09-02: qty 500

## 2022-09-02 MED ORDER — HEPARIN (PORCINE) 25000 UT/250ML-% IV SOLN
650.0000 [IU]/h | INTRAVENOUS | Status: DC
  Administered 2022-09-02: 500 [IU]/h via INTRAVENOUS
  Filled 2022-09-02: qty 250

## 2022-09-02 MED ORDER — PIPERACILLIN-TAZOBACTAM 3.375 G IVPB
3.3750 g | Freq: Three times a day (TID) | INTRAVENOUS | Status: DC
  Administered 2022-09-02 – 2022-09-03 (×3): 3.375 g via INTRAVENOUS
  Filled 2022-09-02 (×3): qty 50

## 2022-09-02 MED ORDER — DONEPEZIL HCL 5 MG PO TABS
5.0000 mg | ORAL_TABLET | Freq: Every day | ORAL | Status: DC
  Administered 2022-09-03 – 2022-09-04 (×2): 5 mg via ORAL
  Filled 2022-09-02 (×2): qty 1

## 2022-09-02 MED ORDER — VANCOMYCIN HCL IN DEXTROSE 1-5 GM/200ML-% IV SOLN
1000.0000 mg | Freq: Once | INTRAVENOUS | Status: AC
  Administered 2022-09-02: 1000 mg via INTRAVENOUS
  Filled 2022-09-02: qty 200

## 2022-09-02 MED ORDER — SODIUM CHLORIDE 0.9% IV SOLUTION
Freq: Once | INTRAVENOUS | Status: DC
  Filled 2022-09-02: qty 250

## 2022-09-02 MED ORDER — CHLORHEXIDINE GLUCONATE CLOTH 2 % EX PADS
6.0000 | MEDICATED_PAD | Freq: Every day | CUTANEOUS | Status: DC
  Administered 2022-09-03 – 2022-09-04 (×2): 6 via TOPICAL

## 2022-09-02 MED ORDER — VANCOMYCIN HCL 750 MG/150ML IV SOLN: 750.0000 mg | INTRAVENOUS | Status: DC

## 2022-09-02 MED ORDER — NOREPINEPHRINE 4 MG/250ML-% IV SOLN
0.0000 ug/min | INTRAVENOUS | Status: DC
  Administered 2022-09-02: 2 ug/min via INTRAVENOUS
  Administered 2022-09-03: 6 ug/min via INTRAVENOUS
  Administered 2022-09-03: 10 ug/min via INTRAVENOUS
  Filled 2022-09-02 (×4): qty 250

## 2022-09-02 MED ORDER — ONDANSETRON HCL 4 MG/2ML IJ SOLN
4.0000 mg | Freq: Once | INTRAMUSCULAR | Status: AC
  Administered 2022-09-02: 4 mg via INTRAVENOUS
  Filled 2022-09-02: qty 2

## 2022-09-02 MED ORDER — LACTATED RINGERS IV BOLUS
500.0000 mL | Freq: Once | INTRAVENOUS | Status: AC
  Administered 2022-09-02: 500 mL via INTRAVENOUS

## 2022-09-02 MED ORDER — POTASSIUM CHLORIDE 10 MEQ/100ML IV SOLN
10.0000 meq | INTRAVENOUS | Status: DC
  Administered 2022-09-02 (×4): 10 meq via INTRAVENOUS
  Filled 2022-09-02 (×3): qty 100

## 2022-09-02 MED ORDER — LACTATED RINGERS IV BOLUS: 500.0000 mL | Freq: Once | INTRAVENOUS | Status: DC

## 2022-09-02 MED ORDER — SODIUM CHLORIDE 0.9 % IV SOLN
1.0000 g | Freq: Once | INTRAVENOUS | Status: AC
  Administered 2022-09-02: 1 g via INTRAVENOUS
  Filled 2022-09-02: qty 10

## 2022-09-02 MED ORDER — FOLIC ACID 1 MG PO TABS
1.0000 mg | ORAL_TABLET | Freq: Every day | ORAL | Status: DC
  Administered 2022-09-03: 1 mg via ORAL
  Filled 2022-09-02: qty 1

## 2022-09-02 MED ORDER — FENTANYL CITRATE PF 50 MCG/ML IJ SOSY
50.0000 ug | PREFILLED_SYRINGE | Freq: Once | INTRAMUSCULAR | Status: AC
  Administered 2022-09-02: 50 ug via INTRAVENOUS
  Filled 2022-09-02: qty 1

## 2022-09-02 MED ORDER — AMIODARONE LOAD VIA INFUSION
150.0000 mg | Freq: Once | INTRAVENOUS | Status: AC
  Administered 2022-09-02: 150 mg via INTRAVENOUS
  Filled 2022-09-02: qty 83.34

## 2022-09-02 MED ORDER — POLYETHYLENE GLYCOL 3350 17 G PO PACK: 17.0000 g | PACK | Freq: Every day | ORAL | Status: DC | PRN

## 2022-09-02 MED ORDER — LACTATED RINGERS IV BOLUS
1000.0000 mL | Freq: Once | INTRAVENOUS | Status: AC
  Administered 2022-09-02: 1000 mL via INTRAVENOUS

## 2022-09-02 MED ORDER — ATORVASTATIN CALCIUM 20 MG PO TABS
20.0000 mg | ORAL_TABLET | Freq: Every day | ORAL | Status: DC
  Administered 2022-09-03: 20 mg via ORAL
  Filled 2022-09-02: qty 1

## 2022-09-02 MED ORDER — HEPARIN BOLUS VIA INFUSION
2500.0000 [IU] | Freq: Once | INTRAVENOUS | Status: AC
  Administered 2022-09-02: 2500 [IU] via INTRAVENOUS
  Filled 2022-09-02: qty 2500

## 2022-09-02 MED ORDER — AMIODARONE HCL IN DEXTROSE 360-4.14 MG/200ML-% IV SOLN
30.0000 mg/h | INTRAVENOUS | Status: DC
  Administered 2022-09-03 – 2022-09-04 (×2): 30 mg/h via INTRAVENOUS
  Filled 2022-09-02 (×3): qty 200

## 2022-09-02 MED ORDER — MAGNESIUM SULFATE 2 GM/50ML IV SOLN
2.0000 g | Freq: Once | INTRAVENOUS | Status: AC
  Administered 2022-09-02: 2 g via INTRAVENOUS
  Filled 2022-09-02: qty 50

## 2022-09-02 MED ORDER — SODIUM CHLORIDE 0.9 % IV SOLN: 250.0000 mL | INTRAVENOUS | Status: DC

## 2022-09-02 NOTE — Consult Note (Signed)
CARDIOLOGY CONSULT NOTE               Patient ID: Vickie Howard MRN: [id number] DOB/AGE: 85-Jan-1937 85 y.o.  Admit date: 09/02/2022 Referring Physician  Hayden Pedro NP Primary Physician Dr. Remo Lipps primary Primary Cardiologist [facility] Reason for Consultation non-STEMI hypertension severe aortic stenosis  HPI: Patient is a 87 year old female with multiple medical problems paroxysmal atrial fibrillation systolic diastolic heart failure preserved EF aortic stenosis severe rheumatoid arthritis CMML GERD hypertension sick sinus syndrome permanent pacemaker in place presents with hypotension altered mental status encephalopathy wide-complex tachycardia requiring cardioversion in the emergency room patient on pressure therapy with neofeeling somewhat better but developed troponin of over 9000 denies any chest pain no worsening shortness of breath just did not feel good.  Patient had significant anemia previous episodes of lower GI bleeding has not been a good anticoagulation candidate now here for further assessment evaluation  Review of systems complete and found to be negative unless listed above     Past Medical History:  Diagnosis Date   Anemia    Arthritis    Atrial fibrillation (HCC)    GERD (gastroesophageal reflux disease)    Heart murmur    History of hiatal hernia    HLD (hyperlipidemia)    HTN (hypertension)    Occasional tremors    hands    Past Surgical History:  Procedure Laterality Date   JOINT REPLACEMENT     right total hip 2015   Left total hip arthroplasty  10/12/2014   PACEMAKER INSERTION N/A 08/12/2015   Procedure: INSERTION PACEMAKER attempt unable to obtain access ;  Surgeon: Isaias Cowman, MD;  Location: [facility] ORS;  Service: Cardiovascular;  Laterality: N/A;   TOTAL HIP ARTHROPLASTY Left 10/12/2014   Procedure: TOTAL HIP ARTHROPLASTY;  Surgeon: Dereck Leep, MD;  Location: [facility] ORS;  Service: Orthopedics;  Laterality: Left;   TUBAL  LIGATION      (Not in a hospital admission)  Social History   Socioeconomic History   Marital status: Widowed    Spouse name: Not on file   Number of children: Not on file   Years of education: Not on file   Highest education level: Not on file  Occupational History   Not on file  Tobacco Use   Smoking status: Never   Smokeless tobacco: Never  Vaping Use   Vaping Use: Never used  Substance and Sexual Activity   Alcohol use: No   Drug use: No   Sexual activity: Never  Other Topics Concern   Not on file  Social History Narrative   Not on file   Social Determinants of Health   Financial Resource Strain: Not on file  Food Insecurity: Not on file  Transportation Needs: Not on file  Physical Activity: Not on file  Stress: Not on file  Social Connections: Not on file  Intimate Partner Violence: Not on file    Family History  Problem Relation Age of Onset   Aneurysm Father    Breast cancer Daughter       Review of systems complete and found to be negative unless listed above      PHYSICAL EXAM  General: Well developed, well nourished, in no acute distress HEENT:  Normocephalic and atramatic Neck:  No JVD.  Lungs: Clear bilaterally to auscultation and percussion. Heart: Irregularly irregular. Normal S1 and S2 without gallops or 3/6 sem murmurs.  Abdomen: Bowel sounds are positive, abdomen soft and non-tender  Msk:  Back normal,  normal gait. Normal strength and tone for age. Extremities: No clubbing, cyanosis or edema.   Neuro: Alert and oriented X 3. Psych:  Good affect, responds appropriately  Labs:   Lab Results  Component Value Date   WBC 41.2 (H) 09/02/2022   HGB 8.6 (L) 09/02/2022   HCT 27.7 (L) 09/02/2022   MCV 109.5 (H) 09/02/2022   PLT 65 (L) 09/02/2022    Recent Labs  Lab 09/02/22 0318 09/02/22 0425  NA 142 138  K 3.1* 4.2  CL 114* 104  CO2 14* 19*  BUN 26* 32*  CREATININE 0.83 1.17*  CALCIUM 7.2* 9.0  PROT 4.7*  --   BILITOT 0.9   --   ALKPHOS 81  --   ALT 376*  --   AST 441*  --   GLUCOSE 94 113*   Lab Results  Component Value Date   TROPONINI 0.06 (H) 08/10/2015    Lab Results  Component Value Date   CHOL 133 02/02/2022   Lab Results  Component Value Date   HDL 42 02/02/2022   Lab Results  Component Value Date   LDLCALC 71 02/02/2022   Lab Results  Component Value Date   TRIG 100 02/02/2022   Lab Results  Component Value Date   CHOLHDL 3.2 02/02/2022   No results found for: "LDLDIRECT"    Radiology: CT CHEST ABDOMEN PELVIS W CONTRAST  Result Date: 09/02/2022 CLINICAL DATA:  Sepsis.  Hypotension with  loss of consciousness. EXAM: CT CHEST, ABDOMEN, AND PELVIS WITH CONTRAST TECHNIQUE: Multidetector CT imaging of the chest, abdomen and pelvis was performed following the standard protocol during bolus administration of intravenous contrast. RADIATION DOSE REDUCTION: This exam was performed according to the departmental dose-optimization program which includes automated exposure control, adjustment of the mA and/or kV according to patient size and/or use of iterative reconstruction technique. CONTRAST:  46mL OMNIPAQUE IOHEXOL 300 MG/ML  SOLN COMPARISON:  CT AP 09/02/2018 FINDINGS: CT CHEST FINDINGS Cardiovascular: There is a right chest wall pacer device with leads in the right atrial appendage and right ventricle. Moderate cardiac enlargement. Aortic atherosclerosis and coronary artery calcifications. No pericardial effusion. Mediastinum/Nodes: Thyroid gland, trachea, and esophagus are unremarkable. No enlarged axillary, mediastinal, or hilar lymph nodes. Lungs/Pleura: Small bilateral pleural effusions identified. Atelectasis and or scarring noted within the posterior and medial right lower lobe. Splenic flexure into the left lower chest. There is compressive type atelectasis within the lingula and left lower lobe associated with the large hernia. Subsegmental atelectasis versus scarring noted within the  posterior and medial right lower lobe. Mild heterogeneous ground-glass attenuation identified within the upper lobes. No airspace consolidation identified. Musculoskeletal: Thoracic spondylosis noted. No acute or suspicious osseous findings. CT ABDOMEN PELVIS FINDINGS Hepatobiliary: Liver cysts identified, unchanged from previous exam. The largest is in the lateral segment of left lobe measuring 2.5 cm. There are stones identified within the dependent portion of the gallbladder. The gallbladder wall appears diffusely edematous. Pericholecystic and perihepatic ascites noted. Pancreas: Unremarkable. No pancreatic ductal dilatation or surrounding inflammatory changes. Spleen: Normal in size without focal abnormality. Adrenals/Urinary Tract: The adrenal glands are unremarkable. No nephrolithiasis, hydronephrosis or kidney mass. Exam detail is markedly diminished within the pelvis due to streak artifact from hip arthroplasty device. Urinary bladder is completely obscured Stomach/Bowel: 100% intrathoracic stomach is identified secondary to large para soft a GIA learn E a. there is no pathologic dilatation of the large or small bowel loops to suggest obstruction. The appendix is not visualized separate from the  right lower quadrant bowel loops. No bowel wall thickening, inflammation, or distension. Sigmoid diverticulosis without signs of acute diverticulitis. Moderate stool burden identified within the rectum. Vascular/Lymphatic: Aortic atherosclerosis. No aneurysm. No abdominopelvic adenopathy. Reproductive: Sub optimal visualization of the uterus. 5.3 x 5.0 cm left ovary cyst is again noted. Previously 4.9 x 4.2 cm. Other: Small volume of perihepatic ascites extends along the right lower quadrant of the abdomen and into the pelvis. No focal fluid collection identified. No signs of pneumoperitoneum. Musculoskeletal: Status post bilateral hip arthroplasty. Bones appear diffusely osteopenic. Mild superior endplate deformity  involving the L3 vertebral body is new from 09/02/18 IMPRESSION: 1. Gallstones with gallbladder wall edema and pericholecystic and perihepatic ascites. Correlate for any clinical signs or symptoms of acute cholecystitis. 2. Small bilateral pleural effusions. Heterogeneous ground-glass attenuation noted within the upper lung zones. Correlate for any clinical signs/symptoms of CHF. 3. Large paraesophageal hernia contains 100% intrathoracic stomach as well as splenic flexure of the colon. 4. Mild superior endplate deformity involving the L3 vertebral body is new from 09/02/18. 5. Mild increase in size of left ovary cyst. Recommend further evaluation with nonemergent, outpatient pelvic sonogram. 6. Sigmoid diverticulosis without signs of acute diverticulitis. 7. Moderate stool burden identified within the rectum. Correlate for any clinical signs or symptoms of constipation. 8.  Aortic Atherosclerosis (ICD10-I70.0). Electronically Signed   By: Kerby Moors M.D.   On: 09/02/2022 10:44   DG Chest Portable 1 View  Result Date: 09/02/2022 CLINICAL DATA:  Hypotension and vomiting, initial encounter EXAM: PORTABLE CHEST 1 VIEW COMPARISON:  08/12/2015 FINDINGS: Cardiac shadow is enlarged. Pacing device is noted. Aortic calcifications are seen. The lungs are well aerated bilaterally. Elevation of left hemidiaphragm is seen. No bony abnormality is noted. IMPRESSION: No active disease. Electronically Signed   By: Inez Catalina M.D.   On: 09/02/2022 03:56    EKG: Atrial fibrillation bundle branch block nonspecific ST changes  ASSESSMENT AND PLAN:  Non-STEMI Paroxysmal atrial fibrillation Severe aortic stenosis Hypotension Altered mental status Sick sinus syndrome Permanent pacemaker in place CMML Chronic anemia Hyperlipidemia GERD Wide-complex tachycardia(probably A-fib with aberrancy) Murmur . Agree with ICU level care Continue pressor support for relative hypotension Recommend heparin for anticoagulation  with elevated troponins over 9000 Do not recommend any invasive procedures at this stage Severe aortic stenosis chronic patient has refused further evaluation and treatment including TAVR in the past Echocardiogram for reassessment of aortic valve as well as post non-STEMI Recommend continued supportive care   Signed: Yolonda Kida MD 09/02/2022, 11:49 AM

## 2022-09-02 NOTE — Progress Notes (Signed)
[place] for initiation and monitoring of heparin infusion Indication: chest pain/ACS  Allergies  Allergen Reactions   Codeine Sulfate Other (See Comments)    Reaction:  Syncope   Levaquin [Levofloxacin] Nausea And Vomiting   Oxycodone Other (See Comments)    Reaction:  Disoriented     Patient Measurements: Weight: 40.8 kg (90 lb) Heparin Dosing Weight: 40.8 kg  Vital Signs: Temp: 97.1 F (36.2 C) (03/30 0334) Temp Source: Rectal (03/30 0334) BP: 87/78 (03/30 1100) Pulse Rate: 55 (03/30 1100)  Labs: Recent Labs    09/02/22 0318 09/02/22 0425 09/02/22 0855  HGB 6.9* 8.6*  --   HCT 22.4* 27.7*  --   PLT 56* 65*  --   APTT  --  38*  --   LABPROT  --  18.4*  --   INR  --  1.5*  --   CREATININE 0.83 1.17*  --   TROPONINIHS 110* 356* 9,141*    CrCl cannot be calculated (Unknown ideal weight.).   Medical History: Past Medical History:  Diagnosis Date   Anemia    Arthritis    Atrial fibrillation (HCC)    GERD (gastroesophageal reflux disease)    Heart murmur    History of hiatal hernia    HLD (hyperlipidemia)    HTN (hypertension)    Occasional tremors    hands    Assessment: 87 y.o. female w/ PMH of Anemia, Arthritis, Atrial fibrillation, GERD, Heart murmur, History of hiatal hernia, HLD, HTN, and Occasional tremors admitted on 09/02/2022 with sepsis. A review of medical records reveals no chronic anticoagulation prior to admission   Goal of Therapy:  Heparin level 0.3-0.7 units/ml Monitor platelets by anticoagulation protocol: Yes   Plan:  Give 2500 units bolus x 1 Start heparin infusion at 500 units/hr Check anti-Xa level in 8 hours and daily while on heparin Continue to monitor H&H and platelets  Dallie Piles 09/02/2022,11:22 AM

## 2022-09-02 NOTE — Progress Notes (Signed)
An USGPIV (ultrasound guided PIV) has been placed for short-term vasopressor infusion. A correctly placed ivWatch must be used when administering Vasopressors. Should this treatment be needed beyond 72 hours, central line access should be obtained.  It will be the responsibility of the bedside nurse to follow best practice to prevent extravasations.   

## 2022-09-02 NOTE — Progress Notes (Signed)
[facility] for initiation and monitoring of heparin infusion Indication: chest pain/ACS  Allergies  Allergen Reactions   Codeine Sulfate Other (See Comments)    Reaction:  Syncope   Levaquin [Levofloxacin] Nausea And Vomiting   Oxycodone Other (See Comments)    Reaction:  Disoriented     Patient Measurements: Weight: 44 kg (97 lb) Heparin Dosing Weight: 40.8 kg  Vital Signs: Temp: 97.3 F (36.3 C) (03/30 1430) Temp Source: Oral (03/30 1430) BP: 103/82 (03/30 1715) Pulse Rate: 42 (03/30 1715)  Labs: Recent Labs    09/02/22 0318 09/02/22 0425 09/02/22 0855 09/02/22 1547 09/02/22 1756  HGB 6.9* 8.6*  --   --   --   HCT 22.4* 27.7*  --   --   --   PLT 56* 65*  --   --   --   APTT  --  38*  --   --   --   LABPROT  --  18.4*  --   --   --   INR  --  1.5*  --   --   --   HEPARINUNFRC  --   --   --   --  0.21*  CREATININE 0.83 1.17*  --   --   --   TROPONINIHS 110* 356* 9,141* >24,000*  --      CrCl cannot be calculated (Unknown ideal weight.).   Medical History: Past Medical History:  Diagnosis Date   Anemia    Arthritis    Atrial fibrillation (HCC)    GERD (gastroesophageal reflux disease)    Heart murmur    History of hiatal hernia    HLD (hyperlipidemia)    HTN (hypertension)    Occasional tremors    hands    Assessment: 87 y.o. female w/ PMH of Anemia, Arthritis, Atrial fibrillation, GERD, Heart murmur, History of hiatal hernia, HLD, HTN, and Occasional tremors admitted on 09/02/2022 with sepsis. A review of medical records reveals no chronic anticoagulation prior to admission   Goal of Therapy:  Heparin level 0.3-0.7 units/ml Monitor platelets by anticoagulation protocol: Yes  Results: 03/30 @ 1756 HL 0.21, subtherapeutic @ 500 u/hr   Plan: HL subtherapeutic -Give heparin 500 units IV x 1 -Increase heparin infusion rate to 550 units/hour -Recheck HL 8 hours after rate change -Daily CBC while on  heparin  Lorin Picket, PharmD 09/02/2022,6:21 PM

## 2022-09-02 NOTE — ED Notes (Signed)
Hospitalist team at bedside at this time.  

## 2022-09-02 NOTE — ED Notes (Signed)
This RN at bedside to administer IV antibiotics, unable to flush 22G in the R hand, IV removed at this time.

## 2022-09-02 NOTE — ED Notes (Addendum)
Pt O2 level 82% on RA at this time. Pt denies any SOB. Pt placed on 4L [place] at this time. Louretta Parma, NP made aware at this time.

## 2022-09-02 NOTE — ED Provider Notes (Signed)
[facility] Provider Note    Event Date/Time   First MD Initiated Contact with Patient 09/02/22 [phone]     (approximate)   History   Hypotension and Loss of Consciousness   HPI  Vickie Howard is a 87 y.o. female who presents to the ED for evaluation of Hypotension and Loss of Consciousness   I reviewed cardiology clinic visit from 3/25.  History of paroxysmal A-fib, aortic stenosis, pacemaker, combined CHF, CML.  Looks like she has severe aortic stenosis but does refuse TAVR in the past.  Patient presents to the ED from home via EMS for evaluation of syncope and hypotension.  Patient reports feeling "sick" before going to bed in a generalized fashion.  She apparently awoke overnight with worsening symptoms and called her daughter who lives nearby.  911 was called and found her on the floor diaphoretic and hypotensive.   Patient denies any shocks from her cardiac device.  She denies any recent illnesses, fever, cough, increased swelling or weight gain, symptoms of blood loss.    Physical Exam   Triage Vital Signs: ED Triage Vitals  Enc Vitals Group     BP 09/02/22 0304 (!) 116/93     Pulse --      Resp 09/02/22 0309 (!) 21     Temp --      Temp src --      SpO2 --      Weight 09/02/22 0306 90 lb (40.8 kg)     Height --      Head Circumference --      Peak Flow --      Pain Score 09/02/22 0306 0     Pain Loc --      Pain Edu? --      Excl. in [place]? --     Most recent vital signs: Vitals:   09/02/22 0338 09/02/22 0340  BP:  (!) 127/109  Pulse:    Resp: (!) 23 (!) 33  Temp:    SpO2:      General: Sitting up in bed, pale and looks unwell.  Follows commands in all 4 extremities and answers questions appropriately CV:  Tachycardic and irregular.  Pale and clammy Resp:  Normal effort.  Abd:  No distention.  MSK:  No deformity noted.  Trace pitting bilateral edema Neuro:  No focal deficits appreciated. Other:     ED Results / Procedures  / Treatments   Labs (all labs ordered are listed, but only abnormal results are displayed) Labs Reviewed  CBC WITH DIFFERENTIAL/PLATELET - Abnormal; Notable for the following components:      Result Value   WBC 27.2 (*)    RBC 2.02 (*)    Hemoglobin 6.9 (*)    HCT 22.4 (*)    MCV 110.9 (*)    MCH 34.2 (*)    RDW 17.3 (*)    Platelets 56 (*)    Neutro Abs 18.1 (*)    Monocytes Absolute 6.7 (*)    Abs Immature Granulocytes 0.97 (*)    All other components within normal limits  CULTURE, BLOOD (ROUTINE X 2)  CULTURE, BLOOD (ROUTINE X 2)  COMPREHENSIVE METABOLIC PANEL  BRAIN NATRIURETIC PEPTIDE  URINALYSIS, ROUTINE W REFLEX MICROSCOPIC  MAGNESIUM  PROTIME-INR  APTT  D-DIMER, QUANTITATIVE  PHOSPHORUS  MAGNESIUM  BASIC METABOLIC PANEL  CBC  PROCALCITONIN  LACTIC ACID, PLASMA  LACTIC ACID, PLASMA  URINALYSIS, COMPLETE (UACMP) WITH MICROSCOPIC  VITAMIN B12  FOLATE  IRON  AND TIBC  FERRITIN  RETICULOCYTES  PREPARE RBC (CROSSMATCH)  PREPARE RBC (CROSSMATCH)  TYPE AND SCREEN  ABO/RH  TROPONIN I (HIGH SENSITIVITY)    EKG Rapid A-fib with a rate of 142 bpm.  Normal axis.  Signs of subendocardial ischemia with ST elevations to aVR and diffuse ST depressions.  No clear STEMI  RADIOLOGY CXR interpreted by me without evidence of acute cardiopulmonary pathology.  Official radiology report(s): DG Chest Portable 1 View  Result Date: 09/02/2022 CLINICAL DATA:  Hypotension and vomiting, initial encounter EXAM: PORTABLE CHEST 1 VIEW COMPARISON:  08/12/2015 FINDINGS: Cardiac shadow is enlarged. Pacing device is noted. Aortic calcifications are seen. The lungs are well aerated bilaterally. Elevation of left hemidiaphragm is seen. No bony abnormality is noted. IMPRESSION: No active disease. Electronically Signed   By: Inez Catalina M.D.   On: 09/02/2022 03:56    PROCEDURES and INTERVENTIONS:  .1-3 Lead EKG Interpretation  Performed by: Vladimir Crofts, MD Authorized by: Vladimir Crofts,  MD     Interpretation: abnormal     ECG rate:  131   ECG rate assessment: tachycardic     Rhythm: atrial fibrillation     Ectopy: none     Conduction: normal   .Critical Care  Performed by: Vladimir Crofts, MD Authorized by: Vladimir Crofts, MD   Critical care provider statement:    Critical care time (minutes):  30   Critical care time was exclusive of:  Separately billable procedures and treating other patients   Critical care was necessary to treat or prevent imminent or life-threatening deterioration of the following conditions:  Cardiac failure and circulatory failure   Critical care was time spent personally by me on the following activities:  Development of treatment plan with patient or surrogate, discussions with consultants, evaluation of patient's response to treatment, examination of patient, ordering and review of laboratory studies, ordering and review of radiographic studies, ordering and performing treatments and interventions, pulse oximetry, re-evaluation of patient's condition and review of old charts .Cardioversion  Date/Time: 09/02/2022 4:30 AM  Performed by: Vladimir Crofts, MD Authorized by: Vladimir Crofts, MD   Consent:    Consent obtained:  Verbal   Consent given by:  Patient Pre-procedure details:    Cardioversion basis:  Emergent   Rhythm:  Atrial fibrillation   Electrode placement:  Anterior-posterior Attempt one:    Cardioversion mode:  Synchronous   Shock (Joules):  200   Shock outcome:  No change in rhythm Post-procedure details:    Patient status:  Awake   Patient tolerance of procedure:  Tolerated well, no immediate complications   Medications  phenylephrine (NEO-SYNEPHRINE) 20mg /NS 259mL premix infusion (30 mcg/min Intravenous Rate/Dose Verify 09/02/22 0349)  docusate sodium (COLACE) capsule 100 mg (has no administration in time range)  polyethylene glycol (MIRALAX / GLYCOLAX) packet 17 g (has no administration in time range)  0.9 %  sodium chloride  infusion (Manually program via Guardrails IV Fluids) (has no administration in time range)  lactated ringers bolus 1,000 mL (1,000 mLs Intravenous New Bag/Given 09/02/22 0316)  ondansetron (ZOFRAN) injection 4 mg (4 mg Intravenous Given 09/02/22 0406)  fentaNYL (SUBLIMAZE) injection 50 mcg (50 mcg Intravenous Given 09/02/22 0413)     IMPRESSION / MDM / ASSESSMENT AND PLAN / ED COURSE  I reviewed the triage vital signs and the nursing notes.  Differential diagnosis includes, but is not limited to, blood loss anemia, sepsis, CHF exacerbation, A-fib with RVR, seizure.  Ventricular dysrhythmia  {Patient presents with symptoms of an acute  illness or injury that is potentially life-threatening.  87 year old woman presents from home with syncope and hypotension from combination of A-fib with RVR and severe aortic stenosis.  I suspect this is further precipitated by a more fundamental medical derangement such as blood loss anemia or sepsis.  Her CBC demonstrates a significant acute leukocytosis on top of her chronic mild leukocytosis, as well as a significant hemoglobin drop without symptoms of blood loss.  Most likely GI source.  As below, many discussions with patient and family regarding goals of care and plan.  We did attempt a single electrical cardioversion which briefly brought her to a sinus rhythm but she quickly returned to A-fib with RVR and it was around this time that blood work is finally resulting that indicates an underlying medical pathology that would need to be addressed.  She remains stable on a Neo-Synephrine drip and admitted to the ICU for further workup and management.  Clinical Course as of 09/02/22 0427  Sat Sep 02, 2022  [id number] Extensive discussion with oldest daughter "[name]" in the family wait room and then I walked her back to the bedside and further discussion at the bedside involving the patient.  Ultimately, patient and daughter confirmed DNR status.  Patient and daughter are  agreeable for medical interventions, pressors and cardioversion is necessary but confirmed no mechanical ventilation or CPR. They would prefer chemical measures only, ideally. Hesitant regarding electrical cardioversion [DS]  0402 Call from [employer]. Quite a few arrhythmias. Nov 2022 until now data. Just this AM, x2 nonsustained Vtach 1-3 seconds. Atrial tach.  [DS]  [id number] Patient with increasing heart rates despite the phenylephrine and continues to be symptomatic, nauseous and feeling unwell.  I again discussed electrical cardioversion with patient and her children and while reluctant they are agreeable.  [DS]  0410 Rufina Falco NP from the ICU in the ED to see this patient for admission [DS]  0422 Synchronized cardioversion at 200 J was performed and she only briefly converted to sinus rhythm before returning to rapid atrial fibrillation.  I this time is when her CBC results and we note for significant anemia and leukocytosis, likely herald of underlying pathology that will need to be addressed prior to cardioversion.  I explained this to the patient and her children.  Patient tolerated the cardioversion quite well and remained stable on phenylephrine, although symptomatic and feeling unwell [DS]    Clinical Course User Index [DS] Vladimir Crofts, MD     FINAL CLINICAL IMPRESSION(S) / ED DIAGNOSES   Final diagnoses:  Atrial fibrillation with RVR ([place])  Severe aortic stenosis  Hypotension, unspecified hypotension type  Anemia, unspecified type     Rx / DC Orders   ED Discharge Orders     None        Note:  This document was prepared using Dragon voice recognition software and may include unintentional dictation errors.   Vladimir Crofts, MD 09/02/22 [phone]

## 2022-09-02 NOTE — Progress Notes (Signed)
PHARMACY CONSULT NOTE - FOLLOW UP  Pharmacy Consult for Electrolyte Monitoring and Replacement   Recent Labs: Potassium (mmol/L)  Date Value  09/02/2022 4.2  09/29/2014 3.9   Magnesium (mg/dL)  Date Value  09/02/2022 2.2  05/01/2014 2.1   Calcium (mg/dL)  Date Value  09/02/2022 9.0   Calcium, Total (mg/dL)  Date Value  09/29/2014 9.5   Albumin (g/dL)  Date Value  09/02/2022 2.4 (L)  04/28/2014 2.9 (L)   Phosphorus (mg/dL)  Date Value  09/02/2022 4.6  12/15/2013 2.9   Sodium (mmol/L)  Date Value  09/02/2022 138  09/29/2014 139     Assessment:  87 y.o. female w/ PMH of Anemia, Arthritis, Atrial fibrillation, GERD, Heart murmur, History of hiatal hernia, HLD, HTN, and Occasional tremors. admitted on 09/02/2022 with sepsis.   Goal of Therapy:  Electrolytes WNL  Plan:  ---no electrolyte replacement warranted for today ----recheck electrolytes with am labs  Dallie Piles ,PharmD Clinical Pharmacist 09/02/2022 8:49 AM

## 2022-09-02 NOTE — Progress Notes (Signed)
  IR is aware of patient and possible need for perc chole.   Awaiting remainder of workup.  Will make patient NPO after MN in case procedure needs to be done tomorrow.  Tim Wilhide S Gavon Majano PA-C 09/02/2022 2:30 PM

## 2022-09-02 NOTE — H&P (Signed)
NAME:  Vickie Howard, MRN:  [id number], DOB:  [birth year], LOS: 0 ADMISSION DATE:  09/02/2022, CONSULTATION DATE:  09/02/2022 REFERRING MD:  Dr. Tamala Julian, CHIEF COMPLAINT:  Shock   History of Present Illness:  87 year old female with extensive co-morbities including A.Fib, Systolic/Diastolic HF (most recent EF 60-65 2017), Aortic stenosis, RA (recently started on methotrexate), CMML. Presents to ED on 3/30. Patient reportedly feeling sick, woke up called daughter to come over, when daughter arrived patient was diaphoretic and minimally responsive. On arrival to ED BP 44/20 with A.Fib RVR (HR 130s).  Shocked with 200J. After requiring NEO gtt, given 1L bolus. EDP spoke with [employer] who noted quiet a few arrhthymias 04/2021 until now, this AM with nonsustained Vtach x 2 for 1-3 seconds. Critical Care Consulted for admission.   Labs revealing for K 3.1, Bicarb 14, BUN 26, Crt 0.83. Mag 1.6. AST/ALT 441/376. WBC 41.2. Hemoglobin 8.6. PLT 65. Troponin 110. Lactic Acid 5.7  Per Family patient lives independently however with some short term memory loss. She has poor oral intake secondary to large hernia and GERD. Patient herself reports progressive weakness, nausea, denies ABD pain, dose state long history of black stools (previously on iron tabs but has been off for a while). Family understanding to multiple co-morbities, does not want patient to suffer, at this time would like to continue medical management.   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   Objective   Blood  pressure (!) 78/56, pulse 87, temperature (!) 97.1 F (36.2 C), temperature source Rectal, resp. rate (!) 25, weight 40.8 kg, SpO2 93 %.        Intake/Output Summary (Last 24 hours) at 09/02/2022 [id number] Last data filed at 09/02/2022 0709 Gross per 24 hour  Intake 1042.6 ml  Output --  Net 1042.6 ml   Filed Weights   09/02/22 0306  Weight: 40.8 kg    Examination: General: Elderly female, lying in bed, no distress  HENT: Dry MM  Lungs: Coarse breath sounds, no use of accessory muslces  Cardiovascular: Tachy, HR 110, irregular, systolic murmur  Abdomen: soft, non-tender, active bowel sounds  Extremities: -edema Neuro: alert, oriented, follows commands  GU: intact  Resolved Hospital Problem list     Assessment & Plan:   Shock with unclear etiology, most likely septic, however concern for component of cardiogenic  NSTEMI, most consistent with Type 2  Plan - Cardiac Monitoring - Change NEO to Levophed, titrate for MAP goal >65  - ECHO pending - Trend Troponin (most recent 9141) > Cards consulted. Heparin gtt started  - MRSA PCR, RVP pending  - CT C/A/P with some gallstones, gallbladder edema, pericholecystic/perihepatic ascites >> contacted IR and discuss possible perc-chole drain, advised to get U/S first  - Follow Culture Data, Send U/A. Zosyn, Vancomycin for now, if MRSA PCR negative can d/c   A.Fib  HTN Diastolic/Systolic HF Severe Aortic Stenosis  Plan  - Heparin gtt and Cardiology Consult as above - ECHO pending  - Hold home Lisinopril   Anion Gap Metabolic Acidosis  AKI Lactic Acidosis  Plan - Trend BMP - Trend Lactic  Acid  - Given 1L bolus in ED. Giving 1/2 L now, currently on room air but monitor closely with fluid  - Trend LFT   H/O GI bleeds, not tolerating AC in past Chronic Anemia, Thrombocytopenia  CMML  Plan - Trend CBC  - Monitor with need of heparin gtt as above   Hypothyroidism  Plan - Continue Sythroid  - TSH/Free T4 pending    RA Plan - Hold Methotrexate and Hydroxychloroquine   Best Practice (right click and "Reselect all SmartList Selections" daily)   Diet/type: NPO DVT prophylaxis: systemic heparin GI prophylaxis: N/A Lines: N/A Foley:  N/A Code Status:  DNR Last date of multidisciplinary goals of care discussion [Updated family at bedside. Unsure if they would want to do invasives lines, okay with medical management for now, does follow palliative care outpatient]  Labs   CBC: Recent Labs  Lab 09/02/22 0318 09/02/22 0425  WBC 27.2* 41.2*  NEUTROABS 18.1*  --   HGB 6.9* 8.6*  HCT 22.4* 27.7*  MCV 110.9* 109.5*  PLT 56* 65*    Basic Metabolic Panel: Recent Labs  Lab 09/02/22 0318 09/02/22 0425  NA 142 138  K 3.1* 4.2  CL 114* 104  CO2 14* 19*  GLUCOSE 94 113*  BUN 26* 32*  CREATININE 0.83 1.17*  CALCIUM 7.2* 9.0  MG 1.6* 2.2  PHOS  --  4.6   GFR: CrCl cannot be calculated (Unknown ideal weight.). Recent Labs  Lab 09/02/22 0318 09/02/22 0425  PROCALCITON  --  0.23  WBC 27.2* 41.2*  LATICACIDVEN  --  5.7*    Liver Function Tests: Recent Labs  Lab 09/02/22 0318  AST 441*  ALT 376*  ALKPHOS 81  BILITOT 0.9  PROT 4.7*  ALBUMIN 2.4*   No results for input(s): "LIPASE", "AMYLASE" in the last 168 hours. No results for input(s): "AMMONIA" in the last 168 hours.  ABG No results found for: "PHART", "PCO2ART", "PO2ART", "HCO3", "TCO2", "ACIDBASEDEF", "O2SAT"   Coagulation Profile: Recent Labs  Lab 09/02/22 0425  INR 1.5*    Cardiac Enzymes: No results for input(s): "CKTOTAL", "CKMB", "CKMBINDEX", "TROPONINI" in the last 168 hours.  HbA1C: Hgb A1c MFr Bld  Date/Time Value Ref Range Status  08/09/2015 11:47 AM 5.3 4.0 - 6.0 % Final    CBG: No results for input(s): "GLUCAP" in the last 168 hours.  Review of Systems:   +weakness, denies cough. SOB with exertion, generalized weakness    Past Medical History:  She,  has a past medical history of Anemia,  Arthritis, Atrial fibrillation ([place]), GERD (gastroesophageal reflux disease), Heart murmur, History of hiatal hernia, HLD (hyperlipidemia), HTN (hypertension), and Occasional tremors.   Surgical History:   Past Surgical History:  Procedure Laterality Date   JOINT REPLACEMENT     right total hip 2015   Left total hip arthroplasty  10/12/2014   PACEMAKER INSERTION N/A 08/12/2015   Procedure: INSERTION PACEMAKER attempt unable to obtain access ;  Surgeon: Isaias Cowman, MD;  Location: [facility] ORS;  Service: Cardiovascular;  Laterality: N/A;   TOTAL HIP ARTHROPLASTY Left 10/12/2014   Procedure: TOTAL HIP ARTHROPLASTY;  Surgeon: Dereck Leep, MD;  Location: [facility] ORS;  Service: Orthopedics;  Laterality: Left;   TUBAL LIGATION       Social History:   reports that she has never smoked. She has never used smokeless tobacco. She reports that she does not drink alcohol and does not use drugs.   Family History:  Her family history includes Aneurysm in  her father; Breast cancer in her daughter.   Allergies Allergies  Allergen Reactions   Codeine Sulfate Other (See Comments)    Reaction:  Syncope   Levaquin [Levofloxacin] Nausea And Vomiting   Oxycodone Other (See Comments)    Reaction:  Disoriented      Home Medications  Prior to Admission medications   Medication Sig Start Date End Date Taking? Authorizing Provider  acetaminophen (TYLENOL) 500 MG tablet Take 1,000 mg by mouth every 6 (six) hours as needed for mild pain or headache.   Yes [provider]  aspirin EC 81 MG tablet Take 81 mg by mouth every other day.    Yes [provider]  atorvastatin (LIPITOR) 20 MG tablet Take 20 mg by mouth daily.   Yes [provider]  Cyanocobalamin (VITAMIN B 12 PO) Take 1 mcg by mouth daily.   Yes [provider]  donepezil (ARICEPT) 5 MG tablet Take 5 mg by mouth daily. 09/11/19  Yes [provider]  hydroxychloroquine (PLAQUENIL) 200 MG tablet Take 200 mg  by mouth daily.   Yes [provider]  docusate sodium (COLACE) 100 MG capsule Take 100 mg by mouth 2 (two) times daily.    [provider]  feeding supplement, ENSURE ENLIVE, (ENSURE ENLIVE) LIQD Take 237 mLs by mouth 2 (two) times daily between meals. 08/13/15   Bettey Costa, MD  ferrous sulfate 325 (65 FE) MG tablet Take 325 mg by mouth daily with breakfast. Patient not taking: Reported on 03/07/2022    [provider]  folic acid (FOLVITE) 1 MG tablet Take 1 mg by mouth daily. 08/11/22 08/06/23  [provider]  LANOXIN 62.5 MCG TABS TK 1 T PO ONCE D Patient not taking: Reported on 11/01/2021 01/16/18   [provider]  levothyroxine (SYNTHROID) 25 MCG tablet Take 1 tablet (25 mcg total) by mouth once daily Take on an empty stomach with a glass of water at least 30-60 minutes before breakfast. 08/14/22 08/14/23  [provider]  lisinopril (PRINIVIL,ZESTRIL) 2.5 MG tablet Take 2.5 mg by mouth daily.  01/03/18   [provider]  Magnesium 200 MG TABS Take 200 mg by mouth daily. Patient not taking: Reported on 07/11/2022    [provider]  methotrexate (RHEUMATREX) 2.5 MG tablet Take 12.5 mg by mouth once a week.    [provider]  mirtazapine (REMERON) 15 MG tablet 30 mg daily. 12/25/18   [provider]  Multiple Vitamin (MULTI-VITAMIN) tablet Take 1 tablet by mouth 1 day or 1 dose.    [provider]  Multiple Vitamins-Minerals (PRESERVISION AREDS 2) CAPS Take 1 capsule by mouth daily.    [provider]  pramipexole (MIRAPEX) 0.125 MG tablet Take by mouth. Patient not taking: Reported on 07/11/2022 09/26/21   [provider]  sertraline (ZOLOFT) 100 MG tablet Take 100 mg by mouth daily. Patient taking 100 mg daily.    [provider]  tiZANidine (ZANAFLEX) 2 MG tablet Take 2 mg by mouth at bedtime. Patient not taking: Reported on 03/07/2022 10/29/21   [provider]      Critical care time: 52 minutes     CRITICAL CARE Performed by: Omar Person   Total critical care time: 52 minutes  Critical care time was exclusive of separately billable procedures and treating other patients.  Critical care was necessary to treat or prevent imminent or life-threatening deterioration.  Critical care was time spent personally by me on the following  activities: development of treatment plan with patient and/or surrogate as well as nursing, discussions with consultants, evaluation of patient's response to treatment, examination of patient, obtaining history from patient or surrogate, ordering and performing treatments and interventions, ordering and review of laboratory studies, ordering and review of radiographic studies, pulse oximetry and re-evaluation of patient's condition.  Hayden Pedro, AGACNP-BC [facility]  PCCM Pgr: [phone]

## 2022-09-02 NOTE — ED Notes (Signed)
Pt had 1 large BM at this time, pt is continent with bowel. This RN and Gabby NT at bedside. Pt clean and dry at this time.

## 2022-09-02 NOTE — Progress Notes (Signed)
Pharmacy Antibiotic Note  Vickie Howard is a 87 y.o. female w/ PMH of Anemia, Arthritis, Atrial fibrillation, GERD, Heart murmur, History of hiatal hernia, HLD, HTN, and Occasional tremors. admitted on 09/02/2022 with sepsis.  Pharmacy has been consulted for vancomycin and Zosyn dosing.  Plan:   1) start vancomycin 1000 mg IV x 1 then 750 mg IV every 48 hours Goal AUC 400-550. Expected AUC: 542.4 SCr used: 1.17 mg/dL Ke 0.024 h-1, T1/2 29.4h  2) start Zosyn 3.375 grams IV every 8 hours EI  Weight: 40.8 kg (90 lb)  Temp (24hrs), Avg:97.1 F (36.2 C), Min:97.1 F (36.2 C), Max:97.1 F (36.2 C)  Recent Labs  Lab 09/02/22 0318 09/02/22 0425  WBC 27.2* 41.2*  CREATININE 0.83 1.17*  LATICACIDVEN  --  5.7*    CrCl cannot be calculated (Unknown ideal weight.).    Allergies  Allergen Reactions   Codeine Sulfate Other (See Comments)    Reaction:  Syncope   Levaquin [Levofloxacin] Nausea And Vomiting   Oxycodone Other (See Comments)    Reaction:  Disoriented     Antimicrobials this admission: 03/30 cefepime x 1 03/30 vancomycin >>  03/30 Zosyn >>   Microbiology results: 03/30 BCx: pending  Thank you for allowing pharmacy to be a part of this patient's care.  Dallie Piles 09/02/2022 8:41 AM

## 2022-09-02 NOTE — ED Notes (Signed)
Boosted pt in bed

## 2022-09-02 NOTE — ED Triage Notes (Signed)
Patient coming from home via EMS C/O becoming diaphoretic when she woke up, threw up and then passed out per family. Initial pressure via EMS was 44/20.

## 2022-09-02 NOTE — ED Notes (Signed)
Pt transported to CT at this time.

## 2022-09-02 NOTE — Progress Notes (Signed)
An auto- generated consult was placed to the IV Nurse for iv access for vasopressor administration;  pt currently has 2 ivs, and per Joe, RN, pt is being weaned off Levo at present; will hold off on placing a 3rd IV at this time , but RN will notify IV Therapy if more access is needed.  Thank you.

## 2022-09-02 NOTE — ED Notes (Signed)
Report received from Catherine, RN

## 2022-09-03 ENCOUNTER — Inpatient Hospital Stay (HOSPITAL_COMMUNITY)
Admit: 2022-09-03 | Discharge: 2022-09-03 | Disposition: A | Discharge status: Home / Self Care | Payer: 59 | Attending: Nurse Practitioner | Admitting: Nurse Practitioner

## 2022-09-03 ENCOUNTER — Inpatient Hospital Stay: Payer: 59 | Admitting: Radiology

## 2022-09-03 DIAGNOSIS — R000 Tachycardia, unspecified: Secondary | ICD-10-CM | POA: Diagnosis not present

## 2022-09-03 DIAGNOSIS — I509 Heart failure, unspecified: Secondary | ICD-10-CM | POA: Diagnosis not present

## 2022-09-03 HISTORY — PX: IR PERC CHOLECYSTOSTOMY: IMG2326

## 2022-09-03 LAB — GLUCOSE, CAPILLARY
Glucose-Capillary: 102 mg/dL — ABNORMAL HIGH (ref 70–99)
Glucose-Capillary: 119 mg/dL — ABNORMAL HIGH (ref 70–99)
Glucose-Capillary: 97 mg/dL (ref 70–99)
Glucose-Capillary: 99 mg/dL (ref 70–99)

## 2022-09-03 LAB — ECHOCARDIOGRAM COMPLETE
AR max vel: 0.31 cm2
AV Area VTI: 0.3 cm2
AV Area mean vel: 0.29 cm2
AV Mean grad: 43.6 mmHg
AV Peak grad: 75.2 mmHg
Ao pk vel: 4.34 m/s
Area-P 1/2: 5.7 cm2
Calc EF: 16 %
Est EF: <20
MV M vel: 6.21 m/s
MV Peak grad: 154.3 mmHg
MV VTI: 0.66 cm2
Radius: 0.5 cm
S' Lateral: 3.9 cm
Single Plane A2C EF: 20.6 %
Single Plane A4C EF: 20.3 %
Weight: 1548.51 oz

## 2022-09-03 LAB — RENAL FUNCTION PANEL
Albumin: 3.4 g/dL — ABNORMAL LOW (ref 3.5–5.0)
Anion gap: 11 (ref 5–15)
BUN: 53 mg/dL — ABNORMAL HIGH (ref 8–23)
CO2: 19 mmol/L — ABNORMAL LOW (ref 22–32)
Calcium: 8.3 mg/dL — ABNORMAL LOW (ref 8.9–10.3)
Chloride: 104 mmol/L (ref 98–111)
Creatinine, Ser: 2.02 mg/dL — ABNORMAL HIGH (ref 0.44–1.00)
GFR, Estimated: 24 mL/min — ABNORMAL LOW (ref >60)
Glucose, Bld: 145 mg/dL — ABNORMAL HIGH (ref 70–99)
Phosphorus: 4.9 mg/dL — ABNORMAL HIGH (ref 2.5–4.6)
Potassium: 4.8 mmol/L (ref 3.5–5.1)
Sodium: 134 mmol/L — ABNORMAL LOW (ref 135–145)

## 2022-09-03 LAB — CBC
HCT: 31.6 % — ABNORMAL LOW (ref 36.0–46.0)
Hemoglobin: 10.6 g/dL — ABNORMAL LOW (ref 12.0–15.0)
MCH: 32.5 pg (ref 26.0–34.0)
MCHC: 33.5 g/dL (ref 30.0–36.0)
MCV: 96.9 fL (ref 80.0–100.0)
Platelets: 64 10*3/uL — ABNORMAL LOW (ref 150–400)
RBC: 3.26 MIL/uL — ABNORMAL LOW (ref 3.87–5.11)
RDW: 20.7 % — ABNORMAL HIGH (ref 11.5–15.5)
WBC: 58.4 10*3/uL (ref 4.0–10.5)
nRBC: 0.1 % (ref 0.0–0.2)

## 2022-09-03 LAB — HEPARIN LEVEL (UNFRACTIONATED): Heparin Unfractionated: 0.22 IU/mL — ABNORMAL LOW (ref 0.30–0.70)

## 2022-09-03 LAB — URINE CULTURE: Culture: NO GROWTH

## 2022-09-03 LAB — TROPONIN I (HIGH SENSITIVITY): Troponin I (High Sensitivity): >24000 ng/L (ref <18)

## 2022-09-03 LAB — MAGNESIUM: Magnesium: 2.5 mg/dL — ABNORMAL HIGH (ref 1.7–2.4)

## 2022-09-03 MED ORDER — FENTANYL CITRATE PF 50 MCG/ML IJ SOSY
25.0000 ug | PREFILLED_SYRINGE | INTRAMUSCULAR | Status: DC | PRN
  Administered 2022-09-03: 25 ug via INTRAVENOUS
  Filled 2022-09-03: qty 1

## 2022-09-03 MED ORDER — IOHEXOL 300 MG/ML  SOLN
10.0000 mL | Freq: Once | INTRAMUSCULAR | Status: AC | PRN
  Administered 2022-09-03: 10 mL

## 2022-09-03 MED ORDER — PIPERACILLIN-TAZOBACTAM IN DEX 2-0.25 GM/50ML IV SOLN
2.2500 g | Freq: Three times a day (TID) | INTRAVENOUS | Status: DC
  Administered 2022-09-03 – 2022-09-04 (×4): 2.25 g via INTRAVENOUS
  Filled 2022-09-03 (×4): qty 50

## 2022-09-03 MED ORDER — HEPARIN (PORCINE) 25000 UT/250ML-% IV SOLN
750.0000 [IU]/h | INTRAVENOUS | Status: DC
  Administered 2022-09-04: 750 [IU]/h via INTRAVENOUS
  Filled 2022-09-03: qty 250

## 2022-09-03 MED ORDER — LIDOCAINE HCL 1 % IJ SOLN
INTRAMUSCULAR | Status: AC
  Administered 2022-09-03: 10 mL
  Filled 2022-09-03: qty 20

## 2022-09-03 MED ORDER — FENTANYL CITRATE (PF) 100 MCG/2ML IJ SOLN
INTRAMUSCULAR | Status: AC
  Filled 2022-09-03: qty 2

## 2022-09-03 MED ORDER — MIDODRINE HCL 5 MG PO TABS
10.0000 mg | ORAL_TABLET | Freq: Three times a day (TID) | ORAL | Status: DC
  Administered 2022-09-03 – 2022-09-04 (×3): 10 mg via ORAL
  Filled 2022-09-03 (×3): qty 2

## 2022-09-03 MED ORDER — FENTANYL CITRATE (PF) 100 MCG/2ML IJ SOLN
INTRAMUSCULAR | Status: DC | PRN
  Administered 2022-09-03 (×2): 25 ug via INTRAVENOUS

## 2022-09-03 MED ORDER — HEPARIN BOLUS VIA INFUSION
600.0000 [IU] | Freq: Once | INTRAVENOUS | Status: AC
  Administered 2022-09-03: 600 [IU] via INTRAVENOUS
  Filled 2022-09-03: qty 600

## 2022-09-03 MED ORDER — LACTATED RINGERS IV BOLUS
500.0000 mL | Freq: Once | INTRAVENOUS | Status: AC
  Administered 2022-09-03: 500 mL via INTRAVENOUS

## 2022-09-03 MED ORDER — MIDAZOLAM HCL 5 MG/5ML IJ SOLN
INTRAMUSCULAR | Status: DC | PRN
  Administered 2022-09-03 (×2): .5 mg via INTRAVENOUS

## 2022-09-03 MED ORDER — MIDAZOLAM HCL 2 MG/2ML IJ SOLN
INTRAMUSCULAR | Status: AC
  Filled 2022-09-03: qty 2

## 2022-09-03 MED ORDER — SODIUM CHLORIDE 0.9% FLUSH
5.0000 mL | Freq: Three times a day (TID) | INTRAVENOUS | Status: DC
  Administered 2022-09-03 – 2022-09-04 (×5): 5 mL

## 2022-09-03 MED ORDER — SODIUM CHLORIDE 0.9 % IV BOLUS
500.0000 mL | Freq: Once | INTRAVENOUS | Status: AC
  Administered 2022-09-03: 500 mL via INTRAVENOUS

## 2022-09-03 NOTE — Progress Notes (Signed)
NAME:  Vickie Howard, MRN:  [id number], DOB:  December 03, 1935, LOS: 1 ADMISSION DATE:  09/02/2022, CONSULTATION DATE:  09/02/2022 REFERRING MD:  Dr. Tamala Julian, CHIEF COMPLAINT:  Shock   History of Present Illness:  87 year old female with extensive co-morbities including A.Fib, Systolic/Diastolic HF (most recent EF 60-65 2017), Aortic stenosis, RA (recently started on methotrexate), CMML. Presents to ED on 3/30. Patient reportedly feeling sick, woke up called daughter to come over, when daughter arrived patient was diaphoretic and minimally responsive. On arrival to ED BP 44/20 with A.Fib RVR (HR 130s).  Shocked with 200J. After requiring NEO gtt, given 1L bolus. EDP spoke with [employer] who noted quiet a few arrhthymias 04/2021 until now, this AM with nonsustained Vtach x 2 for 1-3 seconds. Critical Care Consulted for admission.   Labs revealing for K 3.1, Bicarb 14, BUN 26, Crt 0.83. Mag 1.6. AST/ALT 441/376. WBC 41.2. Hemoglobin 8.6. PLT 65. Troponin 110. Lactic Acid 5.7  Per Family patient lives independently however with some short term memory loss. She has poor oral intake secondary to large hernia and GERD. Patient herself reports progressive weakness, nausea, denies ABD pain, dose state long history of black stools (previously on iron tabs but has been off for a while). Family understanding to multiple co-morbities, does not want patient to suffer, at this time would like to continue medical management.   Pertinent  Medical History  P.A-fib, aortic stenosis (has refused TAVR in past), junctional bradycardia, pacemaker, combined CHF, CMML (followed by Dr Grayland Ormond, diagnosed 2020), hiatal hernia, chronic anemia, thrombocytopenia, HLD, GERD   Significant Hospital Events: Including procedures, antibiotic start and stop dates in addition to other pertinent events   3/30 > Presents to ED with syncope, shock, wide complex tachycardia  3/31: IR Per chole; remains on levophed  Interim History /  Subjective:  Doing well. Remains on levophed overnight.   Objective   Blood pressure (!) 88/64, pulse 65, temperature (!) 97.5 F (36.4 C), temperature source Oral, resp. rate (!) 22, weight 43.9 kg, SpO2 93 %.        Intake/Output Summary (Last 24 hours) at 09/03/2022 1255 Last data filed at 09/03/2022 [id number] Gross per 24 hour  Intake 1696.41 ml  Output 204 ml  Net 1492.41 ml   Filed Weights   09/02/22 0306 09/02/22 1300 09/03/22 0451  Weight: 40.8 kg 44 kg 43.9 kg    Examination: General: Elderly female, lying in bed, no distress  HENT: Dry MM  Lungs: Coarse breath sounds, no use of accessory muslces  Cardiovascular: Tachy, HR 110, irregular, systolic murmur  Abdomen: soft, non-tender, active bowel sounds  Extremities: -edema Neuro: alert, oriented, follows commands  GU: intact  Resolved Hospital Problem list     Assessment & Plan:   Shock with unclear etiology, most likely septic, however concern for component of cardiogenic  NSTEMI, most consistent with Type 2  Plan - Cardiac Monitoring - Continue Levophed, titrate for MAP goal >65  - ECHO pending - Trend Troponin (most recent 9141) > Cards consulted.  - appreciate cards eval and recs - continue Heparin gtt  - MRSA PCR, RVP pending  - CT C/A/P with some gallstones, gallbladder edema, pericholecystic/perihepatic ascites >> contacted IR and discuss possible perc-chole drain, advised to get U/S first  - Follow Culture Data, Send U/A.  - Continue zosyn - MRSA Nares negative  A.Fib  HTN Diastolic/Systolic HF Severe Aortic Stenosis  Plan  - Heparin gtt and Cardiology Consult as above - formal ECHO  read pending  - Hold home Lisinopril  - continue amiodarone  Anion Gap Metabolic Acidosis  AKI Lactic Acidosis  Acute Chole Plan - Trend BMP - Trend Lactic Acid  - Trend LFTs - IR percutaneous cholecystostomy today  H/O GI bleeds, not tolerating AC in past Chronic Anemia, Thrombocytopenia  CMML  Plan -  Trend CBC  - Monitor with need of heparin gtt as above   Hypothyroidism  Plan - Continue Sythroid  - TSH/Free T4 pending   RA Plan - Hold Methotrexate and Hydroxychloroquine   Best Practice (right click and "Reselect all SmartList Selections" daily)   Diet/type: NPO DVT prophylaxis: systemic heparin GI prophylaxis: N/A Lines: N/A Foley:  N/A Code Status:  DNR Last date of multidisciplinary goals of care discussion [Updated family at bedside. Unsure if they would want to do invasives lines, okay with medical management for now, does follow palliative care outpatient]  Labs   CBC: Recent Labs  Lab 09/02/22 0318 09/02/22 0425 09/03/22 0319  WBC 27.2* 41.2* 58.4*  NEUTROABS 18.1*  --   --   HGB 6.9* 8.6* 10.6*  HCT 22.4* 27.7* 31.6*  MCV 110.9* 109.5* 96.9  PLT 56* 65* 64*    Basic Metabolic Panel: Recent Labs  Lab 09/02/22 0318 09/02/22 0425 09/03/22 0319  NA 142 138 134*  K 3.1* 4.2 4.8  CL 114* 104 104  CO2 14* 19* 19*  GLUCOSE 94 113* 145*  BUN 26* 32* 53*  CREATININE 0.83 1.17* 2.02*  CALCIUM 7.2* 9.0 8.3*  MG 1.6* 2.2 2.5*  PHOS  --  4.6 4.9*   GFR: CrCl cannot be calculated (Unknown ideal weight.). Recent Labs  Lab 09/02/22 0318 09/02/22 0425 09/02/22 0425 09/02/22 0855 09/02/22 1546 09/02/22 1756 09/02/22 2223 09/03/22 0319  PROCALCITON  --  0.23  --   --   --   --   --   --   WBC 27.2* 41.2*  --   --   --   --   --  58.4*  LATICACIDVEN  --  5.7*   < > 3.2* 2.8* 3.0* 3.1*  --    < > = values in this interval not displayed.    Liver Function Tests: Recent Labs  Lab 09/02/22 0318 09/03/22 0319  AST 441*  --   ALT 376*  --   ALKPHOS 81  --   BILITOT 0.9  --   PROT 4.7*  --   ALBUMIN 2.4* 3.4*   No results for input(s): "LIPASE", "AMYLASE" in the last 168 hours. No results for input(s): "AMMONIA" in the last 168 hours.  ABG No results found for: "PHART", "PCO2ART", "PO2ART", "HCO3", "TCO2", "ACIDBASEDEF", "O2SAT"   Coagulation  Profile: Recent Labs  Lab 09/02/22 0425  INR 1.5*    Cardiac Enzymes: No results for input(s): "CKTOTAL", "CKMB", "CKMBINDEX", "TROPONINI" in the last 168 hours.  HbA1C: Hgb A1c MFr Bld  Date/Time Value Ref Range Status  08/09/2015 11:47 AM 5.3 4.0 - 6.0 % Final    CBG: Recent Labs  Lab 09/02/22 2319 09/03/22 0634 09/03/22 1201  GLUCAP 131* 97 99    Review of Systems:   +weakness, denies cough. SOB with exertion, generalized weakness    Past Medical History:  She,  has a past medical history of Anemia, Arthritis, Atrial fibrillation ([place]), GERD (gastroesophageal reflux disease), Heart murmur, History of hiatal hernia, HLD (hyperlipidemia), HTN (hypertension), and Occasional tremors.   Surgical History:   Past Surgical History:  Procedure Laterality Date  JOINT REPLACEMENT     right total hip 2015   Left total hip arthroplasty  10/12/2014   PACEMAKER INSERTION N/A 08/12/2015   Procedure: INSERTION PACEMAKER attempt unable to obtain access ;  Surgeon: Isaias Cowman, MD;  Location: [facility] ORS;  Service: Cardiovascular;  Laterality: N/A;   TOTAL HIP ARTHROPLASTY Left 10/12/2014   Procedure: TOTAL HIP ARTHROPLASTY;  Surgeon: Dereck Leep, MD;  Location: [facility] ORS;  Service: Orthopedics;  Laterality: Left;   TUBAL LIGATION       Social History:   reports that she has never smoked. She has never used smokeless tobacco. She reports that she does not drink alcohol and does not use drugs.   Family History:  Her family history includes Aneurysm in her father; Breast cancer in her daughter.   Allergies Allergies  Allergen Reactions   Codeine Sulfate Other (See Comments)    Reaction:  Syncope   Levaquin [Levofloxacin] Nausea And Vomiting   Oxycodone Other (See Comments)    Reaction:  Disoriented      Home Medications  Prior to Admission medications   Medication Sig Start Date End Date Taking? Authorizing Provider  acetaminophen (TYLENOL) 500 MG tablet Take 1,000 mg  by mouth every 6 (six) hours as needed for mild pain or headache.   Yes [provider]  aspirin EC 81 MG tablet Take 81 mg by mouth every other day.    Yes [provider]  atorvastatin (LIPITOR) 20 MG tablet Take 20 mg by mouth daily.   Yes [provider]  Cyanocobalamin (VITAMIN B 12 PO) Take 1 mcg by mouth daily.   Yes [provider]  donepezil (ARICEPT) 5 MG tablet Take 5 mg by mouth daily. 09/11/19  Yes [provider]  hydroxychloroquine (PLAQUENIL) 200 MG tablet Take 200 mg by mouth daily.   Yes [provider]  docusate sodium (COLACE) 100 MG capsule Take 100 mg by mouth 2 (two) times daily.    [provider]  feeding supplement, ENSURE ENLIVE, (ENSURE ENLIVE) LIQD Take 237 mLs by mouth 2 (two) times daily between meals. 08/13/15   Bettey Costa, MD  ferrous sulfate 325 (65 FE) MG tablet Take 325 mg by mouth daily with breakfast. Patient not taking: Reported on 03/07/2022    [provider]  folic acid (FOLVITE) 1 MG tablet Take 1 mg by mouth daily. 08/11/22 08/06/23  [provider]  LANOXIN 62.5 MCG TABS TK 1 T PO ONCE D Patient not taking: Reported on 11/01/2021 01/16/18   [provider]  levothyroxine (SYNTHROID) 25 MCG tablet Take 1 tablet (25 mcg total) by mouth once daily Take on an empty stomach with a glass of water at least 30-60 minutes before breakfast. 08/14/22 08/14/23  [provider]  lisinopril (PRINIVIL,ZESTRIL) 2.5 MG tablet Take 2.5 mg by mouth daily.  01/03/18   [provider]  Magnesium 200 MG TABS Take 200 mg by mouth daily. Patient not taking: Reported on 07/11/2022    [provider]  methotrexate (RHEUMATREX) 2.5 MG tablet Take 12.5 mg by mouth once a week.    [provider]  mirtazapine (REMERON) 15 MG tablet 30 mg daily. 12/25/18   [provider]  Multiple Vitamin (MULTI-VITAMIN) tablet Take 1 tablet by mouth 1 day or 1 dose.    [provider]  Multiple Vitamins-Minerals (PRESERVISION AREDS 2) CAPS Take 1 capsule by mouth daily.    [provider]  pramipexole (MIRAPEX) 0.125 MG tablet Take  by mouth. Patient not taking: Reported on 07/11/2022 09/26/21   [provider]  sertraline (ZOLOFT) 100 MG tablet Take 100 mg by mouth daily. Patient taking 100 mg daily.    [provider]  tiZANidine (ZANAFLEX) 2 MG tablet Take 2 mg by mouth at bedtime. Patient not taking: Reported on 03/07/2022 10/29/21   [provider]     Critical care time: 52 minutes     CRITICAL CARE Performed by: Bronson Ing   Total critical care time: 45 minutes  Critical care time was exclusive of separately billable procedures and treating other patients.  Critical care was necessary to treat or prevent imminent or life-threatening deterioration.  Critical care was time spent personally by me on the following activities: development of treatment plan with patient and/or surrogate as well as nursing, discussions with consultants, evaluation of patient's response to treatment, examination of patient, obtaining history from patient or surrogate, ordering and performing treatments and interventions, ordering and review of laboratory studies, ordering and review of radiographic studies, pulse oximetry and re-evaluation of patient's condition.  Arcelia Jew MD Pulmonary & Critical Care Medicine

## 2022-09-03 NOTE — Procedures (Signed)
Interventional Radiology Procedure Note  Procedure: Percutaneous cholecystostomy  Complications: None  Estimated Blood Loss: None  Findings: 10 Fr cholecystostomy tube placed in GB lumen via low, transhepatic approach. Return of dark, black bile. Sample sent for culture. Attached to gravity bag drainage.  Venetia Night. Kathlene Cote, M.D Pager:  [phone]

## 2022-09-03 NOTE — Progress Notes (Signed)
[place] for initiation and monitoring of heparin infusion Indication: chest pain/ACS  Allergies  Allergen Reactions   Codeine Sulfate Other (See Comments)    Reaction:  Syncope   Levaquin [Levofloxacin] Nausea And Vomiting   Oxycodone Other (See Comments)    Reaction:  Disoriented     Patient Measurements: Weight: 44 kg (97 lb) Heparin Dosing Weight: 40.8 kg  Vital Signs: Temp: 97.5 F (36.4 C) (03/30 2000) Temp Source: Oral (03/30 2000) BP: 96/80 (03/30 2000) Pulse Rate: 93 (03/30 2000)  Labs: Recent Labs    09/02/22 0318 09/02/22 0425 09/02/22 0855 09/02/22 1547 09/02/22 1756 09/03/22 0319  HGB 6.9* 8.6*  --   --   --   --   HCT 22.4* 27.7*  --   --   --   --   PLT 56* 65*  --   --   --   --   APTT  --  38*  --   --   --   --   LABPROT  --  18.4*  --   --   --   --   INR  --  1.5*  --   --   --   --   HEPARINUNFRC  --   --   --   --  0.21* 0.22*  CREATININE 0.83 1.17*  --   --   --   --   TROPONINIHS 110* 356* 9,141* >24,000* >24,000*  --      CrCl cannot be calculated (Unknown ideal weight.).   Medical History: Past Medical History:  Diagnosis Date   Anemia    Arthritis    Atrial fibrillation (HCC)    GERD (gastroesophageal reflux disease)    Heart murmur    History of hiatal hernia    HLD (hyperlipidemia)    HTN (hypertension)    Occasional tremors    hands    Assessment: 87 y.o. female w/ PMH of Anemia, Arthritis, Atrial fibrillation, GERD, Heart murmur, History of hiatal hernia, HLD, HTN, and Occasional tremors admitted on 09/02/2022 with sepsis. A review of medical records reveals no chronic anticoagulation prior to admission   Goal of Therapy:  Heparin level 0.3-0.7 units/ml Monitor platelets by anticoagulation protocol: Yes  Results: 03/30 @ 1756 HL 0.21, subtherapeutic @ 500 u/hr 03/31 @ 0319 HL 0.22, subtherapeutic @ 550 u/hr   Plan: 3/31:  HL @ 0319 = 0.22, SUBtherapeutic  - Will order  heparin 600 units IV X 1 bolus and increase drip rate to 650 units/hr.  -Recheck HL 8 hours after rate change -Daily CBC while on heparin  Vickie Howard D, PharmD 09/03/2022,3:47 AM

## 2022-09-03 NOTE — Progress Notes (Signed)
Pharmacy Antibiotic Note  Vickie Howard is a 87 y.o. female w/ PMH of Anemia, Arthritis, Atrial fibrillation, GERD, Heart murmur, History of hiatal hernia, HLD, HTN, and Occasional tremors. admitted on 09/02/2022 with sepsis.  Pharmacy has been consulted for Zosyn dosing. Renal function has worsened since admission  Plan: adjust Zosyn to 2.25 grams IV every 8 hours  Weight: 43.9 kg (96 lb 12.5 oz)  Temp (24hrs), Avg:97.5 F (36.4 C), Min:97.3 F (36.3 C), Max:97.7 F (36.5 C)  Recent Labs  Lab 09/02/22 0318 09/02/22 0425 09/02/22 0855 09/02/22 1546 09/02/22 1756 09/02/22 2223 09/03/22 0319  WBC 27.2* 41.2*  --   --   --   --  58.4*  CREATININE 0.83 1.17*  --   --   --   --  2.02*  LATICACIDVEN  --  5.7* 3.2* 2.8* 3.0* 3.1*  --      CrCl cannot be calculated (Unknown ideal weight.).    Allergies  Allergen Reactions   Codeine Sulfate Other (See Comments)    Reaction:  Syncope   Levaquin [Levofloxacin] Nausea And Vomiting   Oxycodone Other (See Comments)    Reaction:  Disoriented     Antimicrobials this admission: 03/30 cefepime x 1 03/30 vancomycin x 1  03/30 Zosyn >>   Microbiology results: 03/30 BCx: NG x 1 day 03/30 MRSA PCR negative  Thank you for allowing pharmacy to be a part of this patient's care.  Dallie Piles 09/03/2022 7:31 AM

## 2022-09-03 NOTE — Consult Note (Addendum)
Chief Complaint: Cholecystitis  Referring Physician(s): Hayden Pedro  Supervising Physician: Aletta Edouard  Patient Status: [name] - In-pt  History of Present Illness: Vickie Howard is a 87 y.o. female with medical issues including A.Fib, Systolic/Diastolic HF, Aortic stenosis, RA and CMML.   She presented to the ED on 3/30 feeling ill.  Per chart, she called her daughter and when she arrived the patient was diaphoretic and minimally responsive.   Per chart review, on arrival to the ED her blood pressure was 44/20 with A.Fib RVR (HR 130s).    She was shocked with 200J and required a Neo drip. She was also given 1L bolus.   Labs showed abnormal LFTs with AST/ALT 441/376. WBC 41.2. Hemoglobin 8.6. PLT 65. Troponin 110. Lactic Acid 5.7   CT scan showed = Gallstones with gallbladder wall edema and pericholecystic and perihepatic ascites. Correlate for any clinical signs or symptoms of acute cholecystitis.  US showed= 1. Diffuse gallbladder wall edema and gallstones. Negative sonographic Murphy's sign. Findings may reflect acute cholecystitis. However, gallbladder wall edema may also be seen in the setting of hepatocellular disease, anasarca and hypoproteinemia. 2. Ascites.  Cardiology was consulted and they have placed her on a heparin drip.  We are asked to place a percutaneous cholecystostomy.  She is NPO.    Past Medical History:  Diagnosis Date   Anemia    Arthritis    Atrial fibrillation (HCC)    GERD (gastroesophageal reflux disease)    Heart murmur    History of hiatal hernia    HLD (hyperlipidemia)    HTN (hypertension)    Occasional tremors    hands    Past Surgical History:  Procedure Laterality Date   JOINT REPLACEMENT     right total hip 2015   Left total hip arthroplasty  10/12/2014   PACEMAKER INSERTION N/A 08/12/2015   Procedure: INSERTION PACEMAKER attempt unable to obtain access ;  Surgeon: Isaias Cowman, MD;  Location:  [facility] ORS;  Service: Cardiovascular;  Laterality: N/A;   TOTAL HIP ARTHROPLASTY Left 10/12/2014   Procedure: TOTAL HIP ARTHROPLASTY;  Surgeon: Dereck Leep, MD;  Location: [facility] ORS;  Service: Orthopedics;  Laterality: Left;   TUBAL LIGATION      Allergies: Codeine sulfate, Levaquin [levofloxacin], and Oxycodone  Medications: Prior to Admission medications   Medication Sig Start Date End Date Taking? Authorizing Provider  acetaminophen (TYLENOL) 500 MG tablet Take 1,000 mg by mouth every 6 (six) hours as needed for mild pain or headache.   Yes [provider]  aspirin EC 81 MG tablet Take 81 mg by mouth every other day.    Yes [provider]  atorvastatin (LIPITOR) 20 MG tablet Take 20 mg by mouth daily.   Yes [provider]  Cyanocobalamin (VITAMIN B 12 PO) Take 1 mcg by mouth daily.   Yes [provider]  donepezil (ARICEPT) 5 MG tablet Take 5 mg by mouth daily. 09/11/19  Yes [provider]  folic acid (FOLVITE) 1 MG tablet Take 1 mg by mouth daily. 08/11/22 08/06/23 Yes [provider]  hydroxychloroquine (PLAQUENIL) 200 MG tablet Take 200 mg by mouth daily.   Yes [provider]  levothyroxine (SYNTHROID) 25 MCG tablet Take 1 tablet (25 mcg total) by mouth once daily Take on an empty stomach with a glass of water at least 30-60 minutes before breakfast. 08/14/22 08/14/23 Yes [provider]  lisinopril (PRINIVIL,ZESTRIL) 2.5 MG tablet Take 2.5 mg by mouth daily.  01/03/18  Yes [provider]  Magnesium 200 MG TABS Take 200 mg by mouth daily.   Yes [provider]  methotrexate (RHEUMATREX) 2.5 MG tablet Take 12.5 mg by mouth once a week.   Yes [provider]  mirtazapine (REMERON) 15 MG tablet 30 mg daily. 12/25/18  Yes [provider]  Multiple Vitamin (MULTI-VITAMIN) tablet Take 1 tablet by mouth 1 day or 1 dose.   Yes [provider]  Multiple Vitamins-Minerals (PRESERVISION  AREDS 2) CAPS Take 1 capsule by mouth daily.   Yes [provider]  sertraline (ZOLOFT) 100 MG tablet Take 100 mg by mouth daily. Patient taking 100 mg daily.   Yes [provider]  docusate sodium (COLACE) 100 MG capsule Take 100 mg by mouth 2 (two) times daily.    [provider]  feeding supplement, ENSURE ENLIVE, (ENSURE ENLIVE) LIQD Take 237 mLs by mouth 2 (two) times daily between meals. 08/13/15   Bettey Costa, MD  ferrous sulfate 325 (65 FE) MG tablet Take 325 mg by mouth daily with breakfast. Patient not taking: Reported on 03/07/2022    [provider]  LANOXIN 62.5 MCG TABS TK 1 T PO ONCE D Patient not taking: Reported on 11/01/2021 01/16/18   [provider]  pramipexole (MIRAPEX) 0.125 MG tablet Take by mouth. Patient not taking: Reported on 07/11/2022 09/26/21   [provider]  tiZANidine (ZANAFLEX) 2 MG tablet Take 2 mg by mouth at bedtime. Patient not taking: Reported on 03/07/2022 10/29/21   [provider]     Family History  Problem Relation Age of Onset   Aneurysm Father    Breast cancer Daughter     Social History   Socioeconomic History   Marital status: Widowed    Spouse name: Not on file   Number of children: Not on file   Years of education: Not on file   Highest education level: Not on file  Occupational History   Not on file  Tobacco Use   Smoking status: Never   Smokeless tobacco: Never  Vaping Use   Vaping Use: Never used  Substance and Sexual Activity   Alcohol use: No   Drug use: No   Sexual activity: Never  Other Topics Concern   Not on file  Social History Narrative   Not on file   Social Determinants of Health   Financial Resource Strain: Not on file  Food Insecurity: Not on file  Transportation Needs: Not on file  Physical Activity: Not on file  Stress: Not on file  Social Connections: Not on file     Review of Systems: A 12 point ROS discussed and pertinent positives are  indicated in the HPI above.  All other systems are negative.  Review of Systems  Vital Signs: BP (!) 84/69   Pulse (!) 48   Temp (!) 97.5 F (36.4 C) (Oral)   Resp (!) 22   Wt 96 lb 12.5 oz (43.9 kg)   SpO2 98%   BMI 19.55 kg/m   Physical Exam Vitals reviewed.  Constitutional:      Appearance: Normal appearance.     Comments: Elderly, frail  Cardiovascular:     Rate and Rhythm: Bradycardia present.  Pulmonary:     Effort: Pulmonary effort is normal. No respiratory distress.  Abdominal:     Palpations: Abdomen is soft.  Neurological:     General: No focal deficit present.     Mental Status: She is alert and  oriented to person, place, and time.  Psychiatric:        Mood and Affect: Mood normal.        Behavior: Behavior normal.        Thought Content: Thought content normal.        Judgment: Judgment normal.     Imaging: US Abdomen Limited  Result Date: 09/02/2022 CLINICAL DATA:  Evaluate for acute cholecystitis. EXAM: ULTRASOUND ABDOMEN LIMITED RIGHT UPPER QUADRANT COMPARISON:  None Available. FINDINGS: Gallbladder: Diffuse gallbladder wall edema measures up to 6 mm in thickness. Multiple calcifications with posterior acoustic shadowing identified within the dependent portion of the gallbladder measuring up to 1.18 cm. Sludge noted within the gallbladder. Negative sonographic Murphy's sign. Common bile duct: Diameter: 2.4 mm. Liver: Normal parenchymal echogenicity. Perihepatic ascites noted. Cyst in left hepatic lobe measures 2.8 cm. Portal vein is patent on color Doppler imaging with normal direction of blood flow towards the liver. Other: None. IMPRESSION: 1. Diffuse gallbladder wall edema and gallstones. Negative sonographic Murphy's sign. Findings may reflect acute cholecystitis. However, gallbladder wall edema may also be seen in the setting of hepatocellular disease, anasarca and hypoproteinemia. 2. Ascites. 3. Left hepatic lobe cyst. Electronically Signed   By: Kerby Moors M.D.   On: 09/02/2022 12:53   CT CHEST ABDOMEN PELVIS W CONTRAST  Result Date: 09/02/2022 CLINICAL DATA:  Sepsis.  Hypotension with  loss of consciousness. EXAM: CT CHEST, ABDOMEN, AND PELVIS WITH CONTRAST TECHNIQUE: Multidetector CT imaging of the chest, abdomen and pelvis was performed following the standard protocol during bolus administration of intravenous contrast. RADIATION DOSE REDUCTION: This exam was performed according to the departmental dose-optimization program which includes automated exposure control, adjustment of the mA and/or kV according to patient size and/or use of iterative reconstruction technique. CONTRAST:  19mL OMNIPAQUE IOHEXOL 300 MG/ML  SOLN COMPARISON:  CT AP 09/02/2018 FINDINGS: CT CHEST FINDINGS Cardiovascular: There is a right chest wall pacer device with leads in the right atrial appendage and right ventricle. Moderate cardiac enlargement. Aortic atherosclerosis and coronary artery calcifications. No pericardial effusion. Mediastinum/Nodes: Thyroid gland, trachea, and esophagus are unremarkable. No enlarged axillary, mediastinal, or hilar lymph nodes. Lungs/Pleura: Small bilateral pleural effusions identified. Atelectasis and or scarring noted within the posterior and medial right lower lobe. Splenic flexure into the left lower chest. There is compressive type atelectasis within the lingula and left lower lobe associated with the large hernia. Subsegmental atelectasis versus scarring noted within the posterior and medial right lower lobe. Mild heterogeneous ground-glass attenuation identified within the upper lobes. No airspace consolidation identified. Musculoskeletal: Thoracic spondylosis noted. No acute or suspicious osseous findings. CT ABDOMEN PELVIS FINDINGS Hepatobiliary: Liver cysts identified, unchanged from previous exam. The largest is in the lateral segment of left lobe measuring 2.5 cm. There are stones identified within the dependent portion of the  gallbladder. The gallbladder wall appears diffusely edematous. Pericholecystic and perihepatic ascites noted. Pancreas: Unremarkable. No pancreatic ductal dilatation or surrounding inflammatory changes. Spleen: Normal in size without focal abnormality. Adrenals/Urinary Tract: The adrenal glands are unremarkable. No nephrolithiasis, hydronephrosis or kidney mass. Exam detail is markedly diminished within the pelvis due to streak artifact from hip arthroplasty device. Urinary bladder is completely obscured Stomach/Bowel: 100% intrathoracic stomach is identified secondary to large para soft a GIA learn E a. there is no pathologic dilatation of the large or small bowel loops to suggest obstruction. The appendix is not visualized separate from the right lower quadrant bowel loops. No bowel wall thickening, inflammation, or distension.  Sigmoid diverticulosis without signs of acute diverticulitis. Moderate stool burden identified within the rectum. Vascular/Lymphatic: Aortic atherosclerosis. No aneurysm. No abdominopelvic adenopathy. Reproductive: Sub optimal visualization of the uterus. 5.3 x 5.0 cm left ovary cyst is again noted. Previously 4.9 x 4.2 cm. Other: Small volume of perihepatic ascites extends along the right lower quadrant of the abdomen and into the pelvis. No focal fluid collection identified. No signs of pneumoperitoneum. Musculoskeletal: Status post bilateral hip arthroplasty. Bones appear diffusely osteopenic. Mild superior endplate deformity involving the L3 vertebral body is new from 09/02/18 IMPRESSION: 1. Gallstones with gallbladder wall edema and pericholecystic and perihepatic ascites. Correlate for any clinical signs or symptoms of acute cholecystitis. 2. Small bilateral pleural effusions. Heterogeneous ground-glass attenuation noted within the upper lung zones. Correlate for any clinical signs/symptoms of CHF. 3. Large paraesophageal hernia contains 100% intrathoracic stomach as well as splenic  flexure of the colon. 4. Mild superior endplate deformity involving the L3 vertebral body is new from 09/02/18. 5. Mild increase in size of left ovary cyst. Recommend further evaluation with nonemergent, outpatient pelvic sonogram. 6. Sigmoid diverticulosis without signs of acute diverticulitis. 7. Moderate stool burden identified within the rectum. Correlate for any clinical signs or symptoms of constipation. 8.  Aortic Atherosclerosis (ICD10-I70.0). Electronically Signed   By: Kerby Moors M.D.   On: 09/02/2022 10:44   DG Chest Portable 1 View  Result Date: 09/02/2022 CLINICAL DATA:  Hypotension and vomiting, initial encounter EXAM: PORTABLE CHEST 1 VIEW COMPARISON:  08/12/2015 FINDINGS: Cardiac shadow is enlarged. Pacing device is noted. Aortic calcifications are seen. The lungs are well aerated bilaterally. Elevation of left hemidiaphragm is seen. No bony abnormality is noted. IMPRESSION: No active disease. Electronically Signed   By: Inez Catalina M.D.   On: 09/02/2022 03:56    Labs:  CBC: Recent Labs    08/09/22 1001 09/02/22 0318 09/02/22 0425 09/03/22 0319  WBC 14.6* 27.2* 41.2* 58.4*  HGB 10.6* 6.9* 8.6* 10.6*  HCT 32.8* 22.4* 27.7* 31.6*  PLT 86* 56* 65* 64*    COAGS: Recent Labs    09/02/22 0425  INR 1.5*  APTT 38*    BMP: Recent Labs    07/11/22 0953 09/02/22 0318 09/02/22 0425 09/03/22 0319  NA 139 142 138 134*  K 3.9 3.1* 4.2 4.8  CL 104 114* 104 104  CO2 25 14* 19* 19*  GLUCOSE 94 94 113* 145*  BUN 25* 26* 32* 53*  CALCIUM 9.3 7.2* 9.0 8.3*  CREATININE 0.87 0.83 1.17* 2.02*  GFRNONAA >60 >60 46* 24*    LIVER FUNCTION TESTS: Recent Labs    02/02/22 0915 07/11/22 0953 09/02/22 0318 09/03/22 0319  BILITOT 0.3 0.5 0.9  --   AST 37 33 441*  --   ALT 30 23 376*  --   ALKPHOS 60 49 81  --   PROT 7.8 7.2 4.7*  --   ALBUMIN 4.1 4.0 2.4* 3.4*    TUMOR MARKERS: No results for input(s): "AFPTM", "CEA", "CA199", "CHROMGRNA" in the last 8760  hours.  Assessment and Plan:  Cholecystitis, poor surgical candidate due to advanced age and medical co-morbidities.   Will proceed with image guided percutaneous cholecystostomy tube placement today by Dr. Kathlene Cote.  Risks and benefits will be discussed with the patient including, but not limited to bleeding, infection, gallbladder perforation, bile leak, sepsis or even death.  Consent will be obtained by Dr. Kathlene Cote then scanned into chart.  Thank you for allowing our service to participate in Alleta Barilla  Bouey 's care.  Electronically Signed: Murrell Redden, PA-C   09/03/2022, 11:14 AM      I spent a total of 40 Minutes  in face to face in clinical consultation, greater than 50% of which was counseling/coordinating care for perc chole.

## 2022-09-03 NOTE — Progress Notes (Signed)
Patient here today for 10 FR Cholecystostomy tube placement per Dr Kathlene Cote, on levophed drip from ICU with Broadus John RN/19 managing gtts. Received Versed 1 mg along with Fentanyl 50 mcg IV for procedure. Tolerated well. Awakens to tactile/verbal stimuli, drowsy post procedure. Assisted with transport back to ICU to finish recovery post procedure. Report given to RN with questions answered.

## 2022-09-03 NOTE — Progress Notes (Signed)
[facility] for initiation and monitoring of heparin infusion Indication: chest pain/ACS  Allergies  Allergen Reactions   Codeine Sulfate Other (See Comments)    Reaction:  Syncope   Levaquin [Levofloxacin] Nausea And Vomiting   Oxycodone Other (See Comments)    Reaction:  Disoriented     Patient Measurements: Height: 4\' 11"  (149.9 cm) Weight: 43.9 kg (96 lb 12.5 oz) IBW/kg (Calculated) : 43.2 Heparin Dosing Weight: 40.8 kg  Vital Signs: Temp: 97.5 F (36.4 C) (03/31 0800) Temp Source: Oral (03/31 0800) BP: 70/54 (03/31 1440) Pulse Rate: 118 (03/31 1440)  Labs: Recent Labs    09/02/22 0318 09/02/22 0425 09/02/22 0855 09/02/22 1547 09/02/22 1756 09/03/22 0319  HGB 6.9* 8.6*  --   --   --  10.6*  HCT 22.4* 27.7*  --   --   --  31.6*  PLT 56* 65*  --   --   --  64*  APTT  --  38*  --   --   --   --   LABPROT  --  18.4*  --   --   --   --   INR  --  1.5*  --   --   --   --   HEPARINUNFRC  --   --   --   --  0.21* 0.22*  CREATININE 0.83 1.17*  --   --   --  2.02*  TROPONINIHS 110* 356* 9,141* >24,000* >24,000*  --      Estimated Creatinine Clearance: 13.9 mL/min (A) (by C-G formula based on SCr of 2.02 mg/dL (H)).   Medical History: Past Medical History:  Diagnosis Date   Anemia    Arthritis    Atrial fibrillation (HCC)    GERD (gastroesophageal reflux disease)    Heart murmur    History of hiatal hernia    HLD (hyperlipidemia)    HTN (hypertension)    Occasional tremors    hands    Assessment: 87 y.o. female w/ PMH of Anemia, Arthritis, Atrial fibrillation, GERD, Heart murmur, History of hiatal hernia, HLD, HTN, and Occasional tremors admitted on 09/02/2022 with sepsis. A review of medical records reveals no chronic anticoagulation prior to admission   Goal of Therapy:  Heparin level 0.3-0.7 units/ml Monitor platelets by anticoagulation protocol: Yes  Results: 03/30 @ 1756 HL 0.21, subtherapeutic @ 500 u/hr 03/31  @ 0319 HL 0.22, subtherapeutic @ 550 u/hr   Plan: --Per IR ok to resume heparin infusion 09/03/22 @ 1600 --Resume heparin at 650 units/hr --Check heparin level 8 hours after resumption --Daily CBC while on heparin  Lorin Picket, PharmD 09/03/2022,2:51 PM

## 2022-09-03 NOTE — Progress Notes (Signed)
  Echocardiogram 2D Echocardiogram has been performed.  Vickie Howard 09/03/2022, 10:30 AM

## 2022-09-03 NOTE — Progress Notes (Signed)
Pt right forearm edematous around IV site areas although both IVs flush well with great blood return. Additional IV started on left forearm for vasopressors. Pimary RN to remove IVs on right forearm and continue to monitor sites. Recommend doppler study if right forearm remains edematous.

## 2022-09-03 NOTE — Progress Notes (Signed)
[facility]    SUBJECTIVE: Patient in ICU resting comfortably evidence of hypotension denies any pain denies any palpitations or tachycardia denies any shortness of breath.   Vitals:   09/03/22 0900 09/03/22 1000 09/03/22 1100 09/03/22 1200  BP: (!) 88/50 (!) 84/69 103/78 (!) 88/64  Pulse: 94 (!) 48 (!) 47 65  Resp: (!) 27 (!) 22 (!) 22 (!) 22  Temp:      TempSrc:      SpO2: 94% 98% 96% 93%  Weight:         Intake/Output Summary (Last 24 hours) at 09/03/2022 1322 Last data filed at 09/03/2022 [id number] Gross per 24 hour  Intake 1696.41 ml  Output 204 ml  Net 1492.41 ml      PHYSICAL EXAM  General: Well developed, well nourished, in no acute distress HEENT:  Normocephalic and atramatic Neck:  No JVD.  Lungs: Clear bilaterally to auscultation and percussion. Heart: Irregularly irregular. Normal S1 and S2 without gallops or murmurs.  Abdomen: Bowel sounds are positive, abdomen soft and non-tender  Msk:  Back normal, normal gait. Normal strength and tone for age. Extremities: No clubbing, cyanosis or edema.   Neuro: Alert and oriented X 3. Psych:  Good affect, responds appropriately   LABS: Basic Metabolic Panel: Recent Labs    09/02/22 0425 09/03/22 0319  NA 138 134*  K 4.2 4.8  CL 104 104  CO2 19* 19*  GLUCOSE 113* 145*  BUN 32* 53*  CREATININE 1.17* 2.02*  CALCIUM 9.0 8.3*  MG 2.2 2.5*  PHOS 4.6 4.9*   Liver Function Tests: Recent Labs    09/02/22 0318 09/03/22 0319  AST 441*  --   ALT 376*  --   ALKPHOS 81  --   BILITOT 0.9  --   PROT 4.7*  --   ALBUMIN 2.4* 3.4*   No results for input(s): "LIPASE", "AMYLASE" in the last 72 hours. CBC: Recent Labs    09/02/22 0318 09/02/22 0425 09/03/22 0319  WBC 27.2* 41.2* 58.4*  NEUTROABS 18.1*  --   --   HGB 6.9* 8.6* 10.6*  HCT 22.4* 27.7* 31.6*  MCV 110.9* 109.5* 96.9  PLT 56* 65* 64*   Cardiac Enzymes: No results for input(s): "CKTOTAL", "CKMB", "CKMBINDEX", "TROPONINI" in the last 72  hours. BNP: Invalid input(s): "POCBNP" D-Dimer: Recent Labs    09/02/22 0425  DDIMER 9.01*   Hemoglobin A1C: No results for input(s): "HGBA1C" in the last 72 hours. Fasting Lipid Panel: No results for input(s): "CHOL", "HDL", "LDLCALC", "TRIG", "CHOLHDL", "LDLDIRECT" in the last 72 hours. Thyroid Function Tests: Recent Labs    09/02/22 1547  TSH 7.263*   Anemia Panel: Recent Labs    09/02/22 0425  VITAMINB12 4,221*  FOLATE >40.0  FERRITIN 3,558*  TIBC 165*  IRON 52  RETICCTPCT 1.9    US Abdomen Limited  Result Date: 09/02/2022 CLINICAL DATA:  Evaluate for acute cholecystitis. EXAM: ULTRASOUND ABDOMEN LIMITED RIGHT UPPER QUADRANT COMPARISON:  None Available. FINDINGS: Gallbladder: Diffuse gallbladder wall edema measures up to 6 mm in thickness. Multiple calcifications with posterior acoustic shadowing identified within the dependent portion of the gallbladder measuring up to 1.18 cm. Sludge noted within the gallbladder. Negative sonographic Murphy's sign. Common bile duct: Diameter: 2.4 mm. Liver: Normal parenchymal echogenicity. Perihepatic ascites noted. Cyst in left hepatic lobe measures 2.8 cm. Portal vein is patent on color Doppler imaging with normal direction of blood flow towards the liver. Other: None. IMPRESSION: 1. Diffuse gallbladder wall edema and gallstones. Negative  sonographic Murphy's sign. Findings may reflect acute cholecystitis. However, gallbladder wall edema may also be seen in the setting of hepatocellular disease, anasarca and hypoproteinemia. 2. Ascites. 3. Left hepatic lobe cyst. Electronically Signed   By: Kerby Moors M.D.   On: 09/02/2022 12:53   CT CHEST ABDOMEN PELVIS W CONTRAST  Result Date: 09/02/2022 CLINICAL DATA:  Sepsis.  Hypotension with  loss of consciousness. EXAM: CT CHEST, ABDOMEN, AND PELVIS WITH CONTRAST TECHNIQUE: Multidetector CT imaging of the chest, abdomen and pelvis was performed following the standard protocol during bolus  administration of intravenous contrast. RADIATION DOSE REDUCTION: This exam was performed according to the departmental dose-optimization program which includes automated exposure control, adjustment of the mA and/or kV according to patient size and/or use of iterative reconstruction technique. CONTRAST:  79mL OMNIPAQUE IOHEXOL 300 MG/ML  SOLN COMPARISON:  CT AP 09/02/2018 FINDINGS: CT CHEST FINDINGS Cardiovascular: There is a right chest wall pacer device with leads in the right atrial appendage and right ventricle. Moderate cardiac enlargement. Aortic atherosclerosis and coronary artery calcifications. No pericardial effusion. Mediastinum/Nodes: Thyroid gland, trachea, and esophagus are unremarkable. No enlarged axillary, mediastinal, or hilar lymph nodes. Lungs/Pleura: Small bilateral pleural effusions identified. Atelectasis and or scarring noted within the posterior and medial right lower lobe. Splenic flexure into the left lower chest. There is compressive type atelectasis within the lingula and left lower lobe associated with the large hernia. Subsegmental atelectasis versus scarring noted within the posterior and medial right lower lobe. Mild heterogeneous ground-glass attenuation identified within the upper lobes. No airspace consolidation identified. Musculoskeletal: Thoracic spondylosis noted. No acute or suspicious osseous findings. CT ABDOMEN PELVIS FINDINGS Hepatobiliary: Liver cysts identified, unchanged from previous exam. The largest is in the lateral segment of left lobe measuring 2.5 cm. There are stones identified within the dependent portion of the gallbladder. The gallbladder wall appears diffusely edematous. Pericholecystic and perihepatic ascites noted. Pancreas: Unremarkable. No pancreatic ductal dilatation or surrounding inflammatory changes. Spleen: Normal in size without focal abnormality. Adrenals/Urinary Tract: The adrenal glands are unremarkable. No nephrolithiasis, hydronephrosis or  kidney mass. Exam detail is markedly diminished within the pelvis due to streak artifact from hip arthroplasty device. Urinary bladder is completely obscured Stomach/Bowel: 100% intrathoracic stomach is identified secondary to large para soft a GIA learn E a. there is no pathologic dilatation of the large or small bowel loops to suggest obstruction. The appendix is not visualized separate from the right lower quadrant bowel loops. No bowel wall thickening, inflammation, or distension. Sigmoid diverticulosis without signs of acute diverticulitis. Moderate stool burden identified within the rectum. Vascular/Lymphatic: Aortic atherosclerosis. No aneurysm. No abdominopelvic adenopathy. Reproductive: Sub optimal visualization of the uterus. 5.3 x 5.0 cm left ovary cyst is again noted. Previously 4.9 x 4.2 cm. Other: Small volume of perihepatic ascites extends along the right lower quadrant of the abdomen and into the pelvis. No focal fluid collection identified. No signs of pneumoperitoneum. Musculoskeletal: Status post bilateral hip arthroplasty. Bones appear diffusely osteopenic. Mild superior endplate deformity involving the L3 vertebral body is new from 09/02/18 IMPRESSION: 1. Gallstones with gallbladder wall edema and pericholecystic and perihepatic ascites. Correlate for any clinical signs or symptoms of acute cholecystitis. 2. Small bilateral pleural effusions. Heterogeneous ground-glass attenuation noted within the upper lung zones. Correlate for any clinical signs/symptoms of CHF. 3. Large paraesophageal hernia contains 100% intrathoracic stomach as well as splenic flexure of the colon. 4. Mild superior endplate deformity involving the L3 vertebral body is new from 09/02/18. 5. Mild increase in  size of left ovary cyst. Recommend further evaluation with nonemergent, outpatient pelvic sonogram. 6. Sigmoid diverticulosis without signs of acute diverticulitis. 7. Moderate stool burden identified within the rectum.  Correlate for any clinical signs or symptoms of constipation. 8.  Aortic Atherosclerosis (ICD10-I70.0). Electronically Signed   By: Kerby Moors M.D.   On: 09/02/2022 10:44   DG Chest Portable 1 View  Result Date: 09/02/2022 CLINICAL DATA:  Hypotension and vomiting, initial encounter EXAM: PORTABLE CHEST 1 VIEW COMPARISON:  08/12/2015 FINDINGS: Cardiac shadow is enlarged. Pacing device is noted. Aortic calcifications are seen. The lungs are well aerated bilaterally. Elevation of left hemidiaphragm is seen. No bony abnormality is noted. IMPRESSION: No active disease. Electronically Signed   By: Inez Catalina M.D.   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 GERD Hyperlipidemia Thrombocytopenia Perry cholecystitis/perihepatic abscess awaiting interventional radiology's input for IR   Plan Continue ICU level care Maintain pressure therapy for blood pressure support Heparin for anticoagulation Rate control for atrial fibrillation Cardiogram for reassessment of left ventricular function continue heparin for elevated troponin what appears to be a non-STEMI PPI therapy for reflux Maintain statin therapy for hyperlipidemia Agree with consulting interventional radiology for possible IR drainage Maintain broad-spectrum antibiotic therapy for what appears to be possible sepsis Conservative cardiac input at this stage    Yolonda Kida, MD 09/03/2022 1:22 PM

## 2022-09-03 NOTE — Progress Notes (Signed)
PHARMACY CONSULT NOTE  Pharmacy Consult for Electrolyte Monitoring and Replacement   Recent Labs: Potassium (mmol/L)  Date Value  09/03/2022 4.8  09/29/2014 3.9   Magnesium (mg/dL)  Date Value  09/03/2022 2.5 (H)  05/01/2014 2.1   Calcium (mg/dL)  Date Value  09/03/2022 8.3 (L)   Calcium, Total (mg/dL)  Date Value  09/29/2014 9.5   Albumin (g/dL)  Date Value  09/03/2022 3.4 (L)  04/28/2014 2.9 (L)   Phosphorus (mg/dL)  Date Value  09/03/2022 4.9 (H)  12/15/2013 2.9   Sodium (mmol/L)  Date Value  09/03/2022 134 (L)  09/29/2014 139     Assessment:  87 y.o. female w/ PMH of Anemia, Arthritis, Atrial fibrillation, GERD, Heart murmur, History of hiatal hernia, HLD, HTN, and Occasional tremors. admitted on 09/02/2022 with sepsis.   Goal of Therapy:  Electrolytes WNL  Plan:  ---no electrolyte replacement warranted for today ----recheck electrolytes with am labs  Dallie Piles ,PharmD Clinical Pharmacist 09/03/2022 7:40 AM

## 2022-09-04 ENCOUNTER — Inpatient Hospital Stay: Payer: 59

## 2022-09-04 DIAGNOSIS — N179 Acute kidney failure, unspecified: Secondary | ICD-10-CM

## 2022-09-04 DIAGNOSIS — R000 Tachycardia, unspecified: Secondary | ICD-10-CM | POA: Diagnosis not present

## 2022-09-04 DIAGNOSIS — K819 Cholecystitis, unspecified: Secondary | ICD-10-CM

## 2022-09-04 DIAGNOSIS — R6521 Severe sepsis with septic shock: Secondary | ICD-10-CM

## 2022-09-04 DIAGNOSIS — I4891 Unspecified atrial fibrillation: Secondary | ICD-10-CM

## 2022-09-04 DIAGNOSIS — E440 Moderate protein-calorie malnutrition: Secondary | ICD-10-CM | POA: Insufficient documentation

## 2022-09-04 DIAGNOSIS — I350 Nonrheumatic aortic (valve) stenosis: Secondary | ICD-10-CM

## 2022-09-04 DIAGNOSIS — A419 Sepsis, unspecified organism: Secondary | ICD-10-CM | POA: Diagnosis not present

## 2022-09-04 DIAGNOSIS — Z7189 Other specified counseling: Secondary | ICD-10-CM

## 2022-09-04 LAB — BPAM RBC
Blood Product Expiration Date: 202403312359
Blood Product Expiration Date: 202404142359
Blood Product Expiration Date: 202404202359
ISSUE DATE / TIME: 202403301113
Unit Type and Rh: 600
Unit Type and Rh: 600
Unit Type and Rh: 600

## 2022-09-04 LAB — TYPE AND SCREEN
ABO/RH(D): A NEG
Antibody Screen: POSITIVE
Unit division: 0
Unit division: 0
Unit division: 0

## 2022-09-04 LAB — CBC
HCT: 30.6 % — ABNORMAL LOW (ref 36.0–46.0)
Hemoglobin: 10.2 g/dL — ABNORMAL LOW (ref 12.0–15.0)
MCH: 33.1 pg (ref 26.0–34.0)
MCHC: 33.3 g/dL (ref 30.0–36.0)
MCV: 99.4 fL (ref 80.0–100.0)
Platelets: 56 10*3/uL — ABNORMAL LOW (ref 150–400)
RBC: 3.08 MIL/uL — ABNORMAL LOW (ref 3.87–5.11)
RDW: 19.9 % — ABNORMAL HIGH (ref 11.5–15.5)
WBC: 49.8 10*3/uL — ABNORMAL HIGH (ref 4.0–10.5)
nRBC: 0.4 % — ABNORMAL HIGH (ref 0.0–0.2)

## 2022-09-04 LAB — GLUCOSE, CAPILLARY
Glucose-Capillary: 116 mg/dL — ABNORMAL HIGH (ref 70–99)
Glucose-Capillary: 107 mg/dL — ABNORMAL HIGH (ref 70–99)

## 2022-09-04 LAB — RENAL FUNCTION PANEL
Albumin: 2.8 g/dL — ABNORMAL LOW (ref 3.5–5.0)
Anion gap: 12 (ref 5–15)
BUN: 58 mg/dL — ABNORMAL HIGH (ref 8–23)
CO2: 16 mmol/L — ABNORMAL LOW (ref 22–32)
Calcium: 7.3 mg/dL — ABNORMAL LOW (ref 8.9–10.3)
Chloride: 103 mmol/L (ref 98–111)
Creatinine, Ser: 2.9 mg/dL — ABNORMAL HIGH (ref 0.44–1.00)
GFR, Estimated: 15 mL/min — ABNORMAL LOW (ref >60)
Glucose, Bld: 133 mg/dL — ABNORMAL HIGH (ref 70–99)
Phosphorus: 5.3 mg/dL — ABNORMAL HIGH (ref 2.5–4.6)
Potassium: 4.5 mmol/L (ref 3.5–5.1)
Sodium: 131 mmol/L — ABNORMAL LOW (ref 135–145)

## 2022-09-04 LAB — MAGNESIUM: Magnesium: 2.5 mg/dL — ABNORMAL HIGH (ref 1.7–2.4)

## 2022-09-04 LAB — HEPARIN LEVEL (UNFRACTIONATED)
Heparin Unfractionated: 0.2 IU/mL — ABNORMAL LOW (ref 0.30–0.70)
Heparin Unfractionated: 0.3 IU/mL (ref 0.30–0.70)

## 2022-09-04 MED ORDER — MORPHINE SULFATE (CONCENTRATE) 10 MG/0.5ML PO SOLN
5.0000 mg | ORAL | Status: DC | PRN
  Filled 2022-09-04: qty 0.5

## 2022-09-04 MED ORDER — MORPHINE SULFATE (CONCENTRATE) 10 MG/0.5ML PO SOLN
10.0000 mg | ORAL | Status: DC | PRN
  Filled 2022-09-04: qty 0.5

## 2022-09-04 MED ORDER — ATORVASTATIN CALCIUM 20 MG PO TABS: 40.0000 mg | ORAL_TABLET | Freq: Every day | ORAL | Status: DC

## 2022-09-04 MED ORDER — AMIODARONE HCL 200 MG PO TABS
200.0000 mg | ORAL_TABLET | Freq: Two times a day (BID) | ORAL | Status: DC
  Filled 2022-09-04: qty 1

## 2022-09-04 MED ORDER — GLYCOPYRROLATE 0.2 MG/ML IJ SOLN: 0.2000 mg | INTRAMUSCULAR | Status: DC | PRN

## 2022-09-04 MED ORDER — ONDANSETRON HCL 4 MG/2ML IJ SOLN
4.0000 mg | Freq: Four times a day (QID) | INTRAMUSCULAR | Status: DC
  Administered 2022-09-04: 4 mg via INTRAVENOUS

## 2022-09-04 MED ORDER — ADULT MULTIVITAMIN W/MINERALS CH: 1.0000 | ORAL_TABLET | Freq: Every day | ORAL | Status: DC

## 2022-09-04 MED ORDER — HEPARIN BOLUS VIA INFUSION
600.0000 [IU] | Freq: Once | INTRAVENOUS | Status: AC
  Administered 2022-09-04: 600 [IU] via INTRAVENOUS
  Filled 2022-09-04: qty 600

## 2022-09-04 MED ORDER — BOOST / RESOURCE BREEZE PO LIQD CUSTOM
1.0000 | Freq: Three times a day (TID) | ORAL | Status: DC
  Administered 2022-09-04: 1 via ORAL

## 2022-09-04 MED ORDER — MORPHINE SULFATE (PF) 2 MG/ML IV SOLN
0.5000 mg | INTRAVENOUS | Status: DC | PRN
  Filled 2022-09-04: qty 1

## 2022-09-04 MED ORDER — MORPHINE SULFATE (PF) 2 MG/ML IV SOLN
0.5000 mg | Freq: Once | INTRAVENOUS | Status: AC
  Administered 2022-09-04: 0.5 mg via INTRAVENOUS
  Filled 2022-09-04: qty 1

## 2022-09-04 MED ORDER — MORPHINE SULFATE (PF) 2 MG/ML IV SOLN
1.0000 mg | INTRAVENOUS | Status: DC | PRN
  Administered 2022-09-05 (×4): 1 mg via INTRAVENOUS
  Filled 2022-09-04 (×4): qty 1

## 2022-09-04 MED ORDER — LORAZEPAM 0.5 MG PO TABS: 0.5000 mg | ORAL_TABLET | Freq: Four times a day (QID) | ORAL | Status: DC | PRN

## 2022-09-04 MED ORDER — ONDANSETRON HCL 4 MG/2ML IJ SOLN: 4.0000 mg | Freq: Four times a day (QID) | INTRAMUSCULAR | Status: DC | PRN

## 2022-09-04 MED ORDER — SODIUM CHLORIDE 0.9 % IV SOLN
12.5000 mg | Freq: Four times a day (QID) | INTRAVENOUS | Status: DC | PRN
  Filled 2022-09-04: qty 0.5

## 2022-09-04 NOTE — Progress Notes (Signed)
PHARMACY CONSULT NOTE - FOLLOW UP  Pharmacy Consult for Electrolyte Monitoring and Replacement   Recent Labs: Potassium (mmol/L)  Date Value  09/04/2022 4.5  09/29/2014 3.9   Magnesium (mg/dL)  Date Value  09/04/2022 2.5 (H)  05/01/2014 2.1   Calcium (mg/dL)  Date Value  09/04/2022 7.3 (L)   Calcium, Total (mg/dL)  Date Value  09/29/2014 9.5   Albumin (g/dL)  Date Value  09/04/2022 2.8 (L)  04/28/2014 2.9 (L)   Phosphorus (mg/dL)  Date Value  09/04/2022 5.3 (H)  12/15/2013 2.9   Sodium (mmol/L)  Date Value  09/04/2022 131 (L)  09/29/2014 139    Assessment: 87 y.o. female w/ PMH of Anemia, Arthritis, Atrial fibrillation, GERD, Heart murmur, History of hiatal hernia, HLD, HTN, and Occasional tremors. admitted on 09/02/2022 with sepsis.   Goal of Therapy:  Electrolytes within normal limits  Plan:  No replacement warranted today Follow up labs in AM  Glean Salvo, PharmD, BCPS Clinical Pharmacist  09/04/2022 11:28 AM

## 2022-09-04 NOTE — Progress Notes (Addendum)
[name]       Patient ID: Vickie Howard MRN: [id number] DOB/AGE: [birth year] 87 y.o.  Admit date: 09/02/2022 Referring Physician Hayden Pedro, NP Primary Physician Dr. Ramonita Lab Primary Cardiologist Dr. Clayborn Bigness Reason for Consultation NSTEMI, severe AS  HPI: Vickie Howard is an 86yoF with a PMH of paroxysmal AF (not on AC 2/2 GIB), HFpEF, severe AS (declining TAVR), SSS s/p DC PPM, hypothyroidism, hiatial hernia, RA, CML, memory loss who presented to [facility] ED 09/02/2022 after feeling sick in the morning and called her daughter to come over. She found the patient minimally responsive and diaphoretic. The patient was hypotensive to 44/20 in AF RVR, shocked x1 with 200J, and was admitted to the ICU on vasopressors. CT abd/pelv c/f acute cholecystitis, now s/p IR cholecystostomy drain placement on 3/31. Cardiology following for assistance with NSTEMI. Echo this admission with reduced EF <20% and severe AS.   Interval History:  - renal function significantly worsening overnight.  - on low dose levo. AV pacing on tele in the 60s - she feels a little short of breath with a "knot" up in her throat. No chest pain. Occasional palpitations.  - daughter reports the patient told her she is "ready to go"   Review of systems complete and found to be negative unless listed above     Past Medical History:  Diagnosis Date   Anemia    Arthritis    Atrial fibrillation (HCC)    GERD (gastroesophageal reflux disease)    Heart murmur    History of hiatal hernia    HLD (hyperlipidemia)    HTN (hypertension)    Occasional tremors    hands    Past Surgical History:  Procedure Laterality Date   IR PERC CHOLECYSTOSTOMY  09/03/2022   JOINT REPLACEMENT     right total hip 2015   Left total hip arthroplasty  10/12/2014   PACEMAKER INSERTION N/A 08/12/2015   Procedure: INSERTION PACEMAKER attempt unable to obtain access ;  Surgeon: Isaias Cowman, MD;   Location: [facility] ORS;  Service: Cardiovascular;  Laterality: N/A;   TOTAL HIP ARTHROPLASTY Left 10/12/2014   Procedure: TOTAL HIP ARTHROPLASTY;  Surgeon: Dereck Leep, MD;  Location: [facility] ORS;  Service: Orthopedics;  Laterality: Left;   TUBAL LIGATION      Medications Prior to Admission  Medication Sig Dispense Refill Last Dose   acetaminophen (TYLENOL) 500 MG tablet Take 1,000 mg by mouth every 6 (six) hours as needed for mild pain or headache.   09/01/2022 at hs   aspirin EC 81 MG tablet Take 81 mg by mouth every other day.    09/02/2022 at noon   atorvastatin (LIPITOR) 20 MG tablet Take 20 mg by mouth daily.   09/01/2022 at pm   Cyanocobalamin (VITAMIN B 12 PO) Take 1 mcg by mouth daily.   09/01/2022 at lunch   donepezil (ARICEPT) 5 MG tablet Take 5 mg by mouth daily.   [id number]   folic acid (FOLVITE) 1 MG tablet Take 1 mg by mouth daily.   09/01/2022 at 1200   hydroxychloroquine (PLAQUENIL) 200 MG tablet Take 200 mg by mouth daily.   09/01/2022 at unknown   levothyroxine (SYNTHROID) 25 MCG tablet Take 1 tablet (25 mcg total) by mouth once daily Take on an empty stomach with a glass of water at least 30-60 minutes before breakfast.   09/01/2022   lisinopril (PRINIVIL,ZESTRIL) 2.5 MG tablet Take 2.5 mg by mouth daily.   1 09/01/2022  Magnesium 200 MG TABS Take 200 mg by mouth daily.   09/01/2022   methotrexate (RHEUMATREX) 2.5 MG tablet Take 12.5 mg by mouth once a week.   08/28/2022   mirtazapine (REMERON) 15 MG tablet 30 mg daily.   09/01/2022   Multiple Vitamin (MULTI-VITAMIN) tablet Take 1 tablet by mouth 1 day or 1 dose.   09/01/2022   Multiple Vitamins-Minerals (PRESERVISION AREDS 2) CAPS Take 1 capsule by mouth daily.   09/01/2022   sertraline (ZOLOFT) 100 MG tablet Take 100 mg by mouth daily. Patient taking 100 mg daily.   09/01/2022   docusate sodium (COLACE) 100 MG capsule Take 100 mg by mouth 2 (two) times daily.   prn at unk   feeding supplement, ENSURE ENLIVE, (ENSURE ENLIVE) LIQD Take 237  mLs by mouth 2 (two) times daily between meals. 237 mL 12    ferrous sulfate 325 (65 FE) MG tablet Take 325 mg by mouth daily with breakfast. (Patient not taking: Reported on 03/07/2022)      LANOXIN 62.5 MCG TABS TK 1 T PO ONCE D (Patient not taking: Reported on 11/01/2021)  11    pramipexole (MIRAPEX) 0.125 MG tablet Take by mouth. (Patient not taking: Reported on 07/11/2022)   Not Taking   tiZANidine (ZANAFLEX) 2 MG tablet Take 2 mg by mouth at bedtime. (Patient not taking: Reported on 03/07/2022)   Not Taking   Social History   Socioeconomic History   Marital status: Widowed    Spouse name: Not on file   Number of children: Not on file   Years of education: Not on file   Highest education level: Not on file  Occupational History   Not on file  Tobacco Use   Smoking status: Never   Smokeless tobacco: Never  Vaping Use   Vaping Use: Never used  Substance and Sexual Activity   Alcohol use: No   Drug use: No   Sexual activity: Never  Other Topics Concern   Not on file  Social History Narrative   Not on file   Social Determinants of Health   Financial Resource Strain: Not on file  Food Insecurity: Not on file  Transportation Needs: Not on file  Physical Activity: Not on file  Stress: Not on file  Social Connections: Not on file  Intimate Partner Violence: Not on file    Family History  Problem Relation Age of Onset   Aneurysm Father    Breast cancer Daughter       Intake/Output Summary (Last 24 hours) at 09/04/2022 0911 Last data filed at 09/04/2022 0725 Gross per 24 hour  Intake 2494.58 ml  Output 130 ml  Net 2364.58 ml    Vitals:   09/04/22 0300 09/04/22 0400 09/04/22 0500 09/04/22 0600  BP: (!) 96/58 (!) 87/52 (!) 79/48 (!) 91/53  Pulse: 62 63 (!) 50 (!) 59  Resp: (!) 26 (!) 21 18 (!) 28  Temp:  97.8 F (36.6 C)    TempSrc:      SpO2: 97% 98% 96% 98%  Weight:      Height:        PHYSICAL EXAM General: ill appearing elderly female, in no acute distress.  Sitting upright in ICU bed. Son and daughter at bedside. HEENT:  Normocephalic and atraumatic. Neck:  No JVD.  Lungs: increased work of breathing on room air. Decreased breath sounds with bibasilar crackles. .  Heart: paced rhythm. 4/6 systolic murmur best heard at RUSB Abdomen: Non-distended appearing.  Msk: Normal  strength and tone for age. Extremities: Warm and well perfused. No clubbing, cyanosis. No peripheral edema.  Neuro: Alert and oriented X 3. Psych:  Answers questions appropriately.   Labs: Basic Metabolic Panel: Recent Labs    09/03/22 0319 09/04/22 0337  NA 134* 131*  K 4.8 4.5  CL 104 103  CO2 19* 16*  GLUCOSE 145* 133*  BUN 53* 58*  CREATININE 2.02* 2.90*  CALCIUM 8.3* 7.3*  MG 2.5* 2.5*  PHOS 4.9* 5.3*   Liver Function Tests: Recent Labs    09/02/22 0318 09/03/22 0319 09/04/22 0337  AST 441*  --   --   ALT 376*  --   --   ALKPHOS 81  --   --   BILITOT 0.9  --   --   PROT 4.7*  --   --   ALBUMIN 2.4* 3.4* 2.8*   No results for input(s): "LIPASE", "AMYLASE" in the last 72 hours. CBC: Recent Labs    09/02/22 0318 09/02/22 0425 09/03/22 0319 09/04/22 0337  WBC 27.2*   < > 58.4* 49.8*  NEUTROABS 18.1*  --   --   --   HGB 6.9*   < > 10.6* 10.2*  HCT 22.4*   < > 31.6* 30.6*  MCV 110.9*   < > 96.9 99.4  PLT 56*   < > 64* 56*   < > = values in this interval not displayed.   Cardiac Enzymes: Recent Labs    09/02/22 0855 09/02/22 1547 09/02/22 1756  TROPONINIHS 9,141* >24,000* >24,000*   BNP: Recent Labs    09/02/22 0318  BNP 976.0*   D-Dimer: Recent Labs    09/02/22 0425  DDIMER 9.01*   Hemoglobin A1C: No results for input(s): "HGBA1C" in the last 72 hours. Fasting Lipid Panel: No results for input(s): "CHOL", "HDL", "LDLCALC", "TRIG", "CHOLHDL", "LDLDIRECT" in the last 72 hours. Thyroid Function Tests: Recent Labs    09/02/22 1547  TSH 7.263*   Anemia Panel: Recent Labs    09/02/22 0425  VITAMINB12 4,221*  FOLATE >40.0   FERRITIN 3,558*  TIBC 165*  IRON 52  RETICCTPCT 1.9     Radiology: ECHOCARDIOGRAM COMPLETE  Result Date: 09/03/2022    ECHOCARDIOGRAM REPORT   Patient Name:   Vickie Howard Date of Exam: 09/03/2022 Medical Rec #:  [id number]          Height:       59.0 in Accession #:    [id number]         Weight:       96.8 lb Date of Birth:  Nov 08, 1935          BSA:          1.355 m Patient Age:    73 years           BP:           83/56 mmHg Patient Gender: F                  HR:           88 bpm. Exam Location:  [facility] Procedure: 2D Echo Indications:     CHF I50.9  History:         Patient has prior history of Echocardiogram examinations, most                  recent 08/10/2015.  Sonographer:     Kathlen Brunswick RDCS Referring Phys:  Sharon Diagnosing Phys: Kate Sable  MD IMPRESSIONS  1. Left ventricular ejection fraction, by estimation, is <20%. The left ventricle has severely decreased function. The left ventricle demonstrates global hypokinesis. There is mild left ventricular hypertrophy. Left ventricular diastolic parameters are indeterminate.  2. Right ventricular systolic function is low normal. The right ventricular size is normal. There is moderately elevated pulmonary artery systolic pressure. The estimated right ventricular systolic pressure is 0000000 mmHg.  3. Left atrial size was severely dilated.  4. Right atrial size was mild to moderately dilated.  5. The mitral valve is degenerative. Moderate to severe mitral valve regurgitation. Moderate to severe mitral annular calcification.  6. Tricuspid valve regurgitation is mild to moderate.  7. Mean aortic valve gradient 39mmHg, peak gradient 46mmHg, vmax 4.43m/s, ava 0.3cm2.. The aortic valve is calcified. Aortic valve regurgitation is not visualized. Severe aortic valve stenosis.  8. The inferior vena cava is normal in size with <50% respiratory variability, suggesting right atrial pressure of 8 mmHg. FINDINGS  Left Ventricle: Left  ventricular ejection fraction, by estimation, is <20%. The left ventricle has severely decreased function. The left ventricle demonstrates global hypokinesis. The left ventricular internal cavity size was normal in size. There is mild left ventricular hypertrophy. Left ventricular diastolic parameters are indeterminate. Right Ventricle: The right ventricular size is normal. No increase in right ventricular wall thickness. Right ventricular systolic function is low normal. There is moderately elevated pulmonary artery systolic pressure. The tricuspid regurgitant velocity  is 3.34 m/s, and with an assumed right atrial pressure of 8 mmHg, the estimated right ventricular systolic pressure is 0000000 mmHg. Left Atrium: Left atrial size was severely dilated. Right Atrium: Right atrial size was mild to moderately dilated. Pericardium: There is no evidence of pericardial effusion. Mitral Valve: The mitral valve is degenerative in appearance. Moderate to severe mitral annular calcification. Moderate to severe mitral valve regurgitation. MV peak gradient, 14.6 mmHg. The mean mitral valve gradient is 5.0 mmHg. Tricuspid Valve: The tricuspid valve is normal in structure. Tricuspid valve regurgitation is mild to moderate. Aortic Valve: Mean aortic valve gradient 65mmHg, peak gradient 78mmHg, vmax 4.27m/s, ava 0.3cm2. The aortic valve is calcified. Aortic valve regurgitation is not visualized. Severe aortic stenosis is present. Aortic valve mean gradient measures 43.6 mmHg.  Aortic valve peak gradient measures 75.2 mmHg. Aortic valve area, by VTI measures 0.30 cm. Pulmonic Valve: The pulmonic valve was not well visualized. Pulmonic valve regurgitation is not visualized. Aorta: The aortic root and ascending aorta are structurally normal, with no evidence of dilitation. Venous: The inferior vena cava is normal in size with less than 50% respiratory variability, suggesting right atrial pressure of 8 mmHg. IAS/Shunts: No atrial level  shunt detected by color flow Doppler. Additional Comments: A device lead is visualized.  LEFT VENTRICLE PLAX 2D LVIDd:         4.30 cm     Diastology LVIDs:         3.90 cm     LV e' lateral:   9.25 cm/s LV PW:         1.20 cm     LV E/e' lateral: 14.1 LV IVS:        1.10 cm LVOT diam:     1.70 cm LV SV:         25 LV SV Index:   19 LVOT Area:     2.27 cm  LV Volumes (MOD) LV vol d, MOD A2C: 64.1 ml LV vol d, MOD A4C: 62.0 ml LV vol s, MOD A2C: 50.9  ml LV vol s, MOD A4C: 49.4 ml LV SV MOD A2C:     13.2 ml LV SV MOD A4C:     62.0 ml LV SV MOD BP:      10.6 ml RIGHT VENTRICLE RV Basal diam:  2.60 cm RV S prime:     10.40 cm/s TAPSE (M-mode): 1.6 cm LEFT ATRIUM              Index        RIGHT ATRIUM           Index LA diam:        5.20 cm  3.84 cm/m   RA Area:     17.80 cm LA Vol (A2C):   79.5 ml  58.68 ml/m  RA Volume:   48.80 ml  36.02 ml/m LA Vol (A4C):   104.0 ml 76.77 ml/m LA Biplane Vol: 95.4 ml  70.42 ml/m  AORTIC VALVE                     PULMONIC VALVE AV Area (Vmax):    0.31 cm      PV Vmax:        0.75 m/s AV Area (Vmean):   0.29 cm      PV Peak grad:   2.3 mmHg AV Area (VTI):     0.30 cm      RVOT Peak grad: 1 mmHg AV Vmax:           433.60 cm/s AV Vmean:          307.400 cm/s AV VTI:            0.832 m AV Peak Grad:      75.2 mmHg AV Mean Grad:      43.6 mmHg LVOT Vmax:         59.00 cm/s LVOT Vmean:        38.950 cm/s LVOT VTI:          0.112 m LVOT/AV VTI ratio: 0.13  AORTA Ao Root diam: 2.90 cm Ao Asc diam:  2.60 cm MITRAL VALVE                  TRICUSPID VALVE MV Area (PHT): 5.70 cm       TR Peak grad:   44.6 mmHg MV Area VTI:   0.66 cm       TR Vmax:        334.00 cm/s MV Peak grad:  14.6 mmHg MV Mean grad:  5.0 mmHg       SHUNTS MV Vmax:       1.91 m/s       Systemic VTI:  0.11 m MV Vmean:      97.3 cm/s      Systemic Diam: 1.70 cm MV Decel Time: 133 msec MR Peak grad:    154.3 mmHg MR Mean grad:    85.0 mmHg MR Vmax:         621.00 cm/s MR Vmean:        417.0 cm/s MR PISA:         1.57  cm MR PISA Eff ROA: 10 mm MR PISA Radius:  0.50 cm MV E velocity: 130.00 cm/s Kate Sable MD Electronically signed by Kate Sable MD Signature Date/Time: 09/03/2022/3:13:06 PM    Final    IR Perc Cholecystostomy  Result Date: 09/03/2022 CLINICAL DATA:  Acute cholecystitis, sepsis and need for percutaneous nephrostomy tube as the patient is not a candidate  for immediate cholecystectomy. EXAM: PERCUTANEOUS CHOLECYSTOSTOMY COMPARISON:  Right upper quadrant ultrasound and abdominal CT yesterday ANESTHESIA/SEDATION: Moderate (conscious) sedation was employed during this procedure. A total of Versed 1.0 mg and Fentanyl 50 mcg was administered intravenously by radiology nursing. Moderate Sedation Time: 12 minutes. The patient's level of consciousness and vital signs were monitored continuously by radiology nursing throughout the procedure under my direct supervision. CONTRAST:  95mL OMNIPAQUE IOHEXOL 300 MG/ML  SOLN MEDICATIONS: None FLUOROSCOPY TIME:  1 minute and 12 seconds.  7.4 mGy.  Minutes PROCEDURE: The procedure, risks, benefits, and alternatives were explained to the patient. Questions regarding the procedure were encouraged and answered. The patient understands and consents to the procedure. A time-out was performed prior to initiating the procedure. The right abdominal wall was prepped with chlorhexidine in a sterile fashion, and a sterile drape was applied covering the operative field. A sterile gown and sterile gloves were used for the procedure. Local anesthesia was provided with 1% Lidocaine. Ultrasound was utilized to localize the gallbladder. An ultrasound image was saved and recorded. Fluoroscopy during the procedure and fluoro spot radiograph confirms appropriate catheter position. Ultrasound was utilized to localize the gallbladder. Under direct ultrasound guidance, a 21 gauge needle was advanced via a transhepatic approach into the gallbladder lumen. Aspiration was performed and a bile  sample sent for culture studies. A small amount of diluted contrast material was injected. A guide wire was then advanced into the gallbladder. A transitional dilator was placed. Percutaneous tract dilatation was then performed over a guide wire to 10-French. A 10-French pigtail drainage catheter was then advanced into the gallbladder lumen under fluoroscopy. Catheter was formed and injected with contrast material to confirm position. The catheter was flushed and connected to a gravity drainage bag. It was secured at the skin with a Prolene retention suture and Stat-Lock device. COMPLICATIONS: None FINDINGS: After needle puncture of the gallbladder, a dark colored bile sample was aspirated and sent for culture. The cholecystostomy tube was advanced into the gallbladder lumen and formed. It is now draining bile. This tube will be left to gravity drainage. IMPRESSION: Percutaneous cholecystostomy with placement of 10-French drainage catheter into the gallbladder lumen. This was left to gravity drainage. Electronically Signed   By: Aletta Edouard M.D.   On: 09/03/2022 14:54   US Abdomen Limited  Result Date: 09/02/2022 CLINICAL DATA:  Evaluate for acute cholecystitis. EXAM: ULTRASOUND ABDOMEN LIMITED RIGHT UPPER QUADRANT COMPARISON:  None Available. FINDINGS: Gallbladder: Diffuse gallbladder wall edema measures up to 6 mm in thickness. Multiple calcifications with posterior acoustic shadowing identified within the dependent portion of the gallbladder measuring up to 1.18 cm. Sludge noted within the gallbladder. Negative sonographic Murphy's sign. Common bile duct: Diameter: 2.4 mm. Liver: Normal parenchymal echogenicity. Perihepatic ascites noted. Cyst in left hepatic lobe measures 2.8 cm. Portal vein is patent on color Doppler imaging with normal direction of blood flow towards the liver. Other: None. IMPRESSION: 1. Diffuse gallbladder wall edema and gallstones. Negative sonographic Murphy's sign. Findings may  reflect acute cholecystitis. However, gallbladder wall edema may also be seen in the setting of hepatocellular disease, anasarca and hypoproteinemia. 2. Ascites. 3. Left hepatic lobe cyst. Electronically Signed   By: Kerby Moors M.D.   On: 09/02/2022 12:53   CT CHEST ABDOMEN PELVIS W CONTRAST  Result Date: 09/02/2022 CLINICAL DATA:  Sepsis.  Hypotension with  loss of consciousness. EXAM: CT CHEST, ABDOMEN, AND PELVIS WITH CONTRAST TECHNIQUE: Multidetector CT imaging of the chest, abdomen and pelvis was performed  following the standard protocol during bolus administration of intravenous contrast. RADIATION DOSE REDUCTION: This exam was performed according to the departmental dose-optimization program which includes automated exposure control, adjustment of the mA and/or kV according to patient size and/or use of iterative reconstruction technique. CONTRAST:  99mL OMNIPAQUE IOHEXOL 300 MG/ML  SOLN COMPARISON:  CT AP 09/02/2018 FINDINGS: CT CHEST FINDINGS Cardiovascular: There is a right chest wall pacer device with leads in the right atrial appendage and right ventricle. Moderate cardiac enlargement. Aortic atherosclerosis and coronary artery calcifications. No pericardial effusion. Mediastinum/Nodes: Thyroid gland, trachea, and esophagus are unremarkable. No enlarged axillary, mediastinal, or hilar lymph nodes. Lungs/Pleura: Small bilateral pleural effusions identified. Atelectasis and or scarring noted within the posterior and medial right lower lobe. Splenic flexure into the left lower chest. There is compressive type atelectasis within the lingula and left lower lobe associated with the large hernia. Subsegmental atelectasis versus scarring noted within the posterior and medial right lower lobe. Mild heterogeneous ground-glass attenuation identified within the upper lobes. No airspace consolidation identified. Musculoskeletal: Thoracic spondylosis noted. No acute or suspicious osseous findings. CT ABDOMEN  PELVIS FINDINGS Hepatobiliary: Liver cysts identified, unchanged from previous exam. The largest is in the lateral segment of left lobe measuring 2.5 cm. There are stones identified within the dependent portion of the gallbladder. The gallbladder wall appears diffusely edematous. Pericholecystic and perihepatic ascites noted. Pancreas: Unremarkable. No pancreatic ductal dilatation or surrounding inflammatory changes. Spleen: Normal in size without focal abnormality. Adrenals/Urinary Tract: The adrenal glands are unremarkable. No nephrolithiasis, hydronephrosis or kidney mass. Exam detail is markedly diminished within the pelvis due to streak artifact from hip arthroplasty device. Urinary bladder is completely obscured Stomach/Bowel: 100% intrathoracic stomach is identified secondary to large para soft a GIA learn E a. there is no pathologic dilatation of the large or small bowel loops to suggest obstruction. The appendix is not visualized separate from the right lower quadrant bowel loops. No bowel wall thickening, inflammation, or distension. Sigmoid diverticulosis without signs of acute diverticulitis. Moderate stool burden identified within the rectum. Vascular/Lymphatic: Aortic atherosclerosis. No aneurysm. No abdominopelvic adenopathy. Reproductive: Sub optimal visualization of the uterus. 5.3 x 5.0 cm left ovary cyst is again noted. Previously 4.9 x 4.2 cm. Other: Small volume of perihepatic ascites extends along the right lower quadrant of the abdomen and into the pelvis. No focal fluid collection identified. No signs of pneumoperitoneum. Musculoskeletal: Status post bilateral hip arthroplasty. Bones appear diffusely osteopenic. Mild superior endplate deformity involving the L3 vertebral body is new from 09/02/18 IMPRESSION: 1. Gallstones with gallbladder wall edema and pericholecystic and perihepatic ascites. Correlate for any clinical signs or symptoms of acute cholecystitis. 2. Small bilateral pleural  effusions. Heterogeneous ground-glass attenuation noted within the upper lung zones. Correlate for any clinical signs/symptoms of CHF. 3. Large paraesophageal hernia contains 100% intrathoracic stomach as well as splenic flexure of the colon. 4. Mild superior endplate deformity involving the L3 vertebral body is new from 09/02/18. 5. Mild increase in size of left ovary cyst. Recommend further evaluation with nonemergent, outpatient pelvic sonogram. 6. Sigmoid diverticulosis without signs of acute diverticulitis. 7. Moderate stool burden identified within the rectum. Correlate for any clinical signs or symptoms of constipation. 8.  Aortic Atherosclerosis (ICD10-I70.0). Electronically Signed   By: Kerby Moors M.D.   On: 09/02/2022 10:44   DG Chest Portable 1 View  Result Date: 09/02/2022 CLINICAL DATA:  Hypotension and vomiting, initial encounter EXAM: PORTABLE CHEST 1 VIEW COMPARISON:  08/12/2015 FINDINGS: Cardiac shadow is enlarged.  Pacing device is noted. Aortic calcifications are seen. The lungs are well aerated bilaterally. Elevation of left hemidiaphragm is seen. No bony abnormality is noted. IMPRESSION: No active disease. Electronically Signed   By: Inez Catalina M.D.   On: 09/02/2022 03:56    TELEMETRY reviewed by me (LT) 09/04/2022 : AV pacing 60s  EKG reviewed by me: AF LBBB, ST depressions  Data reviewed by me (LT) 09/04/2022: PCCM notes, IR note, nursing notes. last 24h vitals tele labs imaging I/O    Principal Problem:   Wide-complex tachycardia    ASSESSMENT AND PLAN:  Vickie Howard is an 77yoF with a PMH of paroxysmal AF (not on AC 2/2 GIB), HFpEF, severe AS (declining TAVR), SSS s/p DC PPM, hypothyroidism, hiatial hernia, RA, CML, memory loss who presented to [facility] ED 09/02/2022 after feeling sick in the morning and called her daughter to come over. She found the patient minimally responsive and diaphoretic. The patient was hypotensive to 44/20 in AF RVR, shocked x1 with 200J, and was  admitted to the ICU on vasopressors. CT abd/pelv c/f acute cholecystitis, now s/p IR cholecystostomy drain placement on 3/31. Cardiology following for assistance with NSTEMI. Echo this admission with reduced EF <20% and severe AS.   # septic / cardiogenic shock  # acute cholecystitis  # AKI Now s/p perc cholecystostomy drain yesterday. On zosyn IV and 27mcg levophed for BP support. Cr uptrending from 2.02 -- 2.90 today and not making urine per nursing. Consider trial of lasix   # NSTEMI # new HFrEF (<20%)  Echo with new reduced EF < 20% and global HK. Enzymes peaked at 24K , EKG with LBBB and diffuse ST depressions. Felt to be demand ischemia in setting of sepsis and unstable AF requiring defibrillation on presentation. Anticoagulated with heparin. Unable to use antiplatelets with significant thrombocytopenia (plts 56K). EF newly reduced with severe AS as below. Poor prognosis from a cardiac standpoint with multiple cormorbidities (underlying CML) and severe critical illness, no further cardiac diagnostics recommended.  - agree with palliative consult to facilitate GOC/end of life discussions  # AF RVR  # chronic LBBB # SSS s/p DC PPM  Presented in AF RVR, unstable with BP 66/40 and rate 160s, requiring defibrillation with 200J x 1 in the ED. Remains on amiodarone and heparin infusions. Currently AV pacing on tele in the 60s. Not on chronic AF prior to admission d/t history of GI bleeding  # severe AS  Known history for years, continues to decline TAVR eval.   This patient's plan of care was discussed and created with Dr. Clayborn Bigness and he is in agreement.  Signed: Tristan Howard , PA-C 09/04/2022, 9:11 AM [facility]

## 2022-09-04 NOTE — Progress Notes (Signed)
Initial Nutrition Assessment  DOCUMENTATION CODES:   Non-severe (moderate) malnutrition in context of chronic illness  INTERVENTION:   Boost Breeze po TID, each supplement provides 250 kcal and 9 grams of protein  Magic cup TID with meals, each supplement provides 290 kcal and 9 grams of protein  Yogurt on meal trays  MVI po daily   Dysphagia 3 diet   Pt at high refeed risk; recommend monitor potassium, magnesium and phosphorus labs daily until stable  Daily weights   NUTRITION DIAGNOSIS:   Moderate Malnutrition related to chronic illness (large hiatal hernia, CHF) as evidenced by moderate fat depletion, moderate muscle depletion.  GOAL:   Patient will meet greater than or equal to 90% of their needs  MONITOR:   PO intake, Supplement acceptance, Labs, Weight trends, I & O's, Skin  REASON FOR ASSESSMENT:   Rounds    ASSESSMENT:   87 y/o female with h/o hypothyroidism, hiatal hernia, GERD, HLD, HTN, bradycardia, aortic stenosis, SSS s/p pacemaker, B12 deficiency, CMML, RA, Afib and CHF who is admitted with sepsis, shock, cholecystitis, AKI and NSTEMI.  Pt s/p IR cholecystostomy tube placement 3/31.   Met with pt and pt's daughter in room today. Pt reports that she is feeling ok today. Pt's daughter reports pt with poor appetite and oral intake at baseline r/t advanced age, GERD and hiatal hernia. Pt enjoys yogurt and drinks Ensure clear at home (pt does not like Ensure or milky supplements). Pt only ate bites of breakfast. RD will add supplements and MVI to help pt meet her estimated needs. Pt is likely at refeed risk. Per chart, pt appears weight stable pta. Family focusing more on comfort and non invasive therapy.    Medications reviewed and include: folic acid, synthroid, NaCl @50ml /hr  Labs reviewed: Na 131(L), K 4.5 wnl, BUN 58(H), creat 2.90(H), P 5.3(H), Mg 2.5(H) BNP- 976(H)- 3/30 Cbgs- 107, 116 x 24 hrs  Drains- 167ml   NUTRITION - FOCUSED PHYSICAL  EXAM:  Flowsheet Row Most Recent Value  Orbital Region Moderate depletion  Upper Arm Region Mild depletion  Thoracic and Lumbar Region Mild depletion  Buccal Region Moderate depletion  Temple Region Mild depletion  Clavicle Bone Region Moderate depletion  Clavicle and Acromion Bone Region Moderate depletion  Scapular Bone Region No depletion  Dorsal Hand Mild depletion  Patellar Region Moderate depletion  Anterior Thigh Region Moderate depletion  Posterior Calf Region Severe depletion  Edema (RD Assessment) None  Hair Reviewed  Eyes Reviewed  Mouth Reviewed  Skin Reviewed  Nails Reviewed   Diet Order:   Diet Order             DIET DYS 3 Room service appropriate? Yes with Assist; Fluid consistency: Thin  Diet effective now                  EDUCATION NEEDS:   Education needs have been addressed  Skin:  Skin Assessment: Reviewed RN Assessment (ecchymosis)  Last BM:  4/1- type 7- 35ml via rectal tube  Height:   Ht Readings from Last 1 Encounters:  09/03/22 4\' 11"  (1.499 m)    Weight:   Wt Readings from Last 1 Encounters:  09/03/22 43.9 kg    Ideal Body Weight:  44.5 kg  BMI:  Body mass index is 19.55 kg/m.  Estimated Nutritional Needs:   Kcal:  1200-1400kcal/day  Protein:  60-70g/day  Fluid:  1.1-1.3L/day  Koleen Distance MS, RD, LDN Please refer to [facility] for RD and/or RD on-call/weekend/after hours  pager

## 2022-09-04 NOTE — Progress Notes (Addendum)
0800 Talking with NP J.Keene about code status. Patient is a DNR already. 0900 Patient cleaned of stool x 2. Watery consistency. Ask if patient wanted have rectal containment system placed. Patient agreed. Flexi Seal placed per request. Patient happy she is not incontinent of stool. 1000 Patient complains of shortness of breath.Oxygen placed on patient at 1 L nasal cannula. Patient calmer with oxygen on. Per oximeter sensor patient is 98-100% before oxygen placed. Respirations are shallow but lungs are clear.Given 1 time dose of Morphine  mg iv as ordered. 1200 Family discussed patient's prognosis with Dr.Dgayli and J.Keene NP. Decisions made. Maish Vaya NP in to discuss prognosis and patient wishes for her care. Cardiac monitor discontinued per orders. 1700 Foley placed per orders. Bath given. Patient nauseated. Given prn. 1800 Patient enjoying family.

## 2022-09-04 NOTE — Progress Notes (Signed)
Patient transferred to new bed and then taken to room 125 at this time. Family at bedside and made aware. Patient remains A& O wit no acute distress noted. No c/o pain. All personal items in room taken with patient at this time. RN and Tech accompanied.

## 2022-09-04 NOTE — Progress Notes (Signed)
Referring Physician(s): * No referring provider recorded for this case *  Supervising Physician: Juliet Rude  Patient Status:  [facility] - In-pt  Chief Complaint:  Acute cholecystitis s/p percutaneous cholecystostomy drain placement 09/03/22  Subjective:  Patient alert and accompanied by family at bedside. Patient appears ill, and per patient's family, the patient is being placed on hospice care with comfort care only.  Allergies: Codeine sulfate, Levaquin [levofloxacin], and Oxycodone  Medications: Prior to Admission medications   Medication Sig Start Date End Date Taking? Authorizing Provider  acetaminophen (TYLENOL) 500 MG tablet Take 1,000 mg by mouth every 6 (six) hours as needed for mild pain or headache.   Yes [provider]  aspirin EC 81 MG tablet Take 81 mg by mouth every other day.    Yes [provider]  atorvastatin (LIPITOR) 20 MG tablet Take 20 mg by mouth daily.   Yes [provider]  Cyanocobalamin (VITAMIN B 12 PO) Take 1 mcg by mouth daily.   Yes [provider]  donepezil (ARICEPT) 5 MG tablet Take 5 mg by mouth daily. 09/11/19  Yes [provider]  folic acid (FOLVITE) 1 MG tablet Take 1 mg by mouth daily. 08/11/22 08/06/23 Yes [provider]  hydroxychloroquine (PLAQUENIL) 200 MG tablet Take 200 mg by mouth daily.   Yes [provider]  levothyroxine (SYNTHROID) 25 MCG tablet Take 1 tablet (25 mcg total) by mouth once daily Take on an empty stomach with a glass of water at least 30-60 minutes before breakfast. 08/14/22 08/14/23 Yes [provider]  lisinopril (PRINIVIL,ZESTRIL) 2.5 MG tablet Take 2.5 mg by mouth daily.  01/03/18  Yes [provider]  Magnesium 200 MG TABS Take 200 mg by mouth daily.   Yes [provider]  methotrexate (RHEUMATREX) 2.5 MG tablet Take 12.5 mg by mouth once a week.   Yes [provider]  mirtazapine (REMERON) 15 MG tablet 30 mg daily.  12/25/18  Yes [provider]  Multiple Vitamin (MULTI-VITAMIN) tablet Take 1 tablet by mouth 1 day or 1 dose.   Yes [provider]  Multiple Vitamins-Minerals (PRESERVISION AREDS 2) CAPS Take 1 capsule by mouth daily.   Yes [provider]  sertraline (ZOLOFT) 100 MG tablet Take 100 mg by mouth daily. Patient taking 100 mg daily.   Yes [provider]  docusate sodium (COLACE) 100 MG capsule Take 100 mg by mouth 2 (two) times daily.    [provider]  feeding supplement, ENSURE ENLIVE, (ENSURE ENLIVE) LIQD Take 237 mLs by mouth 2 (two) times daily between meals. 08/13/15   Bettey Costa, MD  ferrous sulfate 325 (65 FE) MG tablet Take 325 mg by mouth daily with breakfast. Patient not taking: Reported on 03/07/2022    [provider]  LANOXIN 62.5 MCG TABS TK 1 T PO ONCE D Patient not taking: Reported on 11/01/2021 01/16/18   [provider]  pramipexole (MIRAPEX) 0.125 MG tablet Take by mouth. Patient not taking: Reported on 07/11/2022 09/26/21   [provider]  tiZANidine (ZANAFLEX) 2 MG tablet Take 2 mg by mouth at bedtime. Patient not taking: Reported on 03/07/2022 10/29/21   [provider]     Vital Signs: BP 111/71   Pulse 60   Temp (!) 97 F (36.1 C)   Resp (!) 27   Ht 4\' 11"  (1.499 m)   Wt 96 lb 12.5 oz (43.9 kg)   SpO2 95%   BMI 19.55 kg/m  Physical Exam Constitutional:      General: She is not in acute distress.    Appearance: She is ill-appearing.  Cardiovascular:     Rate and Rhythm: Normal rate and regular rhythm.     Pulses: Normal pulses.  Pulmonary:     Effort: Pulmonary effort is normal.  Abdominal:     Palpations: Abdomen is soft.     Tenderness: There is no abdominal tenderness.     Comments: RUQ perc chole drain in place. ~185mL of bilious output in drain at time of exam. Site is clean, dry, intact, and dressed appropriately. Drain flushes easily.  Neurological:     Mental Status:  She is alert.  Psychiatric:        Mood and Affect: Mood normal.        Behavior: Behavior normal.     Imaging: DG Chest Port 1 View  Result Date: 09/04/2022 CLINICAL DATA:  Shortness of breath. EXAM: PORTABLE CHEST 1 VIEW COMPARISON:  09/02/2022. FINDINGS: 0932 hours. Right chest dual-chamber pacemaker with leads projecting over the right atrium and ventricle. Stable elevation of the left hemidiaphragm with adjacent atelectasis in the left lung base. Increased small right pleural effusion. No pneumothorax. Stable cardiomegaly and mediastinal contours. IMPRESSION: Increased small right pleural effusion. Electronically Signed   By: Emmit Alexanders M.D.   On: 09/04/2022 09:48   ECHOCARDIOGRAM COMPLETE  Result Date: 09/03/2022    ECHOCARDIOGRAM REPORT   Patient Name:   IVADELLE KORP Date of Exam: 09/03/2022 Medical Rec #:  [id number]          Height:       59.0 in Accession #:    [id number]         Weight:       96.8 lb Date of Birth:  [birth year]          BSA:          1.355 m Patient Age:    87 years           BP:           83/56 mmHg Patient Gender: F                  HR:           88 bpm. Exam Location:  [facility] Procedure: 2D Echo Indications:     CHF I50.9  History:         Patient has prior history of Echocardiogram examinations, most                  recent 08/10/2015.  Sonographer:     Kathlen Brunswick RDCS Referring Phys:  [id number] Omar Person Diagnosing Phys: Kate Sable MD IMPRESSIONS  1. Left ventricular ejection fraction, by estimation, is <20%. The left ventricle has severely decreased function. The left ventricle demonstrates global hypokinesis. There is mild left ventricular hypertrophy. Left ventricular diastolic parameters are indeterminate.  2. Right ventricular systolic function is low normal. The right ventricular size is normal. There is moderately elevated pulmonary artery systolic pressure. The estimated right ventricular systolic pressure is 0000000 mmHg.  3. Left atrial size  was severely dilated.  4. Right atrial size was mild to moderately dilated.  5. The mitral valve is degenerative. Moderate to severe mitral valve regurgitation. Moderate to severe mitral annular calcification.  6. Tricuspid valve regurgitation is mild to moderate.  7. Mean aortic valve gradient 21mmHg, peak gradient 88mmHg, vmax 4.68m/s, ava 0.3cm2.. The aortic valve is calcified. Aortic  valve regurgitation is not visualized. Severe aortic valve stenosis.  8. The inferior vena cava is normal in size with <50% respiratory variability, suggesting right atrial pressure of 8 mmHg. FINDINGS  Left Ventricle: Left ventricular ejection fraction, by estimation, is <20%. The left ventricle has severely decreased function. The left ventricle demonstrates global hypokinesis. The left ventricular internal cavity size was normal in size. There is mild left ventricular hypertrophy. Left ventricular diastolic parameters are indeterminate. Right Ventricle: The right ventricular size is normal. No increase in right ventricular wall thickness. Right ventricular systolic function is low normal. There is moderately elevated pulmonary artery systolic pressure. The tricuspid regurgitant velocity  is 3.34 m/s, and with an assumed right atrial pressure of 8 mmHg, the estimated right ventricular systolic pressure is 0000000 mmHg. Left Atrium: Left atrial size was severely dilated. Right Atrium: Right atrial size was mild to moderately dilated. Pericardium: There is no evidence of pericardial effusion. Mitral Valve: The mitral valve is degenerative in appearance. Moderate to severe mitral annular calcification. Moderate to severe mitral valve regurgitation. MV peak gradient, 14.6 mmHg. The mean mitral valve gradient is 5.0 mmHg. Tricuspid Valve: The tricuspid valve is normal in structure. Tricuspid valve regurgitation is mild to moderate. Aortic Valve: Mean aortic valve gradient 77mmHg, peak gradient 53mmHg, vmax 4.40m/s, ava 0.3cm2. The aortic  valve is calcified. Aortic valve regurgitation is not visualized. Severe aortic stenosis is present. Aortic valve mean gradient measures 43.6 mmHg.  Aortic valve peak gradient measures 75.2 mmHg. Aortic valve area, by VTI measures 0.30 cm. Pulmonic Valve: The pulmonic valve was not well visualized. Pulmonic valve regurgitation is not visualized. Aorta: The aortic root and ascending aorta are structurally normal, with no evidence of dilitation. Venous: The inferior vena cava is normal in size with less than 50% respiratory variability, suggesting right atrial pressure of 8 mmHg. IAS/Shunts: No atrial level shunt detected by color flow Doppler. Additional Comments: A device lead is visualized.  LEFT VENTRICLE PLAX 2D LVIDd:         4.30 cm     Diastology LVIDs:         3.90 cm     LV e' lateral:   9.25 cm/s LV PW:         1.20 cm     LV E/e' lateral: 14.1 LV IVS:        1.10 cm LVOT diam:     1.70 cm LV SV:         25 LV SV Index:   19 LVOT Area:     2.27 cm  LV Volumes (MOD) LV vol d, MOD A2C: 64.1 ml LV vol d, MOD A4C: 62.0 ml LV vol s, MOD A2C: 50.9 ml LV vol s, MOD A4C: 49.4 ml LV SV MOD A2C:     13.2 ml LV SV MOD A4C:     62.0 ml LV SV MOD BP:      10.6 ml RIGHT VENTRICLE RV Basal diam:  2.60 cm RV S prime:     10.40 cm/s TAPSE (M-mode): 1.6 cm LEFT ATRIUM              Index        RIGHT ATRIUM           Index LA diam:        5.20 cm  3.84 cm/m   RA Area:     17.80 cm LA Vol (A2C):   79.5 ml  58.68 ml/m  RA Volume:   48.80  ml  36.02 ml/m LA Vol (A4C):   104.0 ml 76.77 ml/m LA Biplane Vol: 95.4 ml  70.42 ml/m  AORTIC VALVE                     PULMONIC VALVE AV Area (Vmax):    0.31 cm      PV Vmax:        0.75 m/s AV Area (Vmean):   0.29 cm      PV Peak grad:   2.3 mmHg AV Area (VTI):     0.30 cm      RVOT Peak grad: 1 mmHg AV Vmax:           433.60 cm/s AV Vmean:          307.400 cm/s AV VTI:            0.832 m AV Peak Grad:      75.2 mmHg AV Mean Grad:      43.6 mmHg LVOT Vmax:         59.00 cm/s LVOT  Vmean:        38.950 cm/s LVOT VTI:          0.112 m LVOT/AV VTI ratio: 0.13  AORTA Ao Root diam: 2.90 cm Ao Asc diam:  2.60 cm MITRAL VALVE                  TRICUSPID VALVE MV Area (PHT): 5.70 cm       TR Peak grad:   44.6 mmHg MV Area VTI:   0.66 cm       TR Vmax:        334.00 cm/s MV Peak grad:  14.6 mmHg MV Mean grad:  5.0 mmHg       SHUNTS MV Vmax:       1.91 m/s       Systemic VTI:  0.11 m MV Vmean:      97.3 cm/s      Systemic Diam: 1.70 cm MV Decel Time: 133 msec MR Peak grad:    154.3 mmHg MR Mean grad:    85.0 mmHg MR Vmax:         621.00 cm/s MR Vmean:        417.0 cm/s MR PISA:         1.57 cm MR PISA Eff ROA: 10 mm MR PISA Radius:  0.50 cm MV E velocity: 130.00 cm/s Kate Sable MD Electronically signed by Kate Sable MD Signature Date/Time: 09/03/2022/3:13:06 PM    Final    IR Perc Cholecystostomy  Result Date: 09/03/2022 CLINICAL DATA:  Acute cholecystitis, sepsis and need for percutaneous nephrostomy tube as the patient is not a candidate for immediate cholecystectomy. EXAM: PERCUTANEOUS CHOLECYSTOSTOMY COMPARISON:  Right upper quadrant ultrasound and abdominal CT yesterday ANESTHESIA/SEDATION: Moderate (conscious) sedation was employed during this procedure. A total of Versed 1.0 mg and Fentanyl 50 mcg was administered intravenously by radiology nursing. Moderate Sedation Time: 12 minutes. The patient's level of consciousness and vital signs were monitored continuously by radiology nursing throughout the procedure under my direct supervision. CONTRAST:  32mL OMNIPAQUE IOHEXOL 300 MG/ML  SOLN MEDICATIONS: None FLUOROSCOPY TIME:  1 minute and 12 seconds.  7.4 mGy.  Minutes PROCEDURE: The procedure, risks, benefits, and alternatives were explained to the patient. Questions regarding the procedure were encouraged and answered. The patient understands and consents to the procedure. A time-out was performed prior to initiating the procedure. The right abdominal wall was prepped with  chlorhexidine in a sterile fashion, and a sterile drape was applied covering the operative field. A sterile gown and sterile gloves were used for the procedure. Local anesthesia was provided with 1% Lidocaine. Ultrasound was utilized to localize the gallbladder. An ultrasound image was saved and recorded. Fluoroscopy during the procedure and fluoro spot radiograph confirms appropriate catheter position. Ultrasound was utilized to localize the gallbladder. Under direct ultrasound guidance, a 21 gauge needle was advanced via a transhepatic approach into the gallbladder lumen. Aspiration was performed and a bile sample sent for culture studies. A small amount of diluted contrast material was injected. A guide wire was then advanced into the gallbladder. A transitional dilator was placed. Percutaneous tract dilatation was then performed over a guide wire to 10-French. A 10-French pigtail drainage catheter was then advanced into the gallbladder lumen under fluoroscopy. Catheter was formed and injected with contrast material to confirm position. The catheter was flushed and connected to a gravity drainage bag. It was secured at the skin with a Prolene retention suture and Stat-Lock device. COMPLICATIONS: None FINDINGS: After needle puncture of the gallbladder, a dark colored bile sample was aspirated and sent for culture. The cholecystostomy tube was advanced into the gallbladder lumen and formed. It is now draining bile. This tube will be left to gravity drainage. IMPRESSION: Percutaneous cholecystostomy with placement of 10-French drainage catheter into the gallbladder lumen. This was left to gravity drainage. Electronically Signed   By: Aletta Edouard M.D.   On: 09/03/2022 14:54   US Abdomen Limited  Result Date: 09/02/2022 CLINICAL DATA:  Evaluate for acute cholecystitis. EXAM: ULTRASOUND ABDOMEN LIMITED RIGHT UPPER QUADRANT COMPARISON:  None Available. FINDINGS: Gallbladder: Diffuse gallbladder wall edema  measures up to 6 mm in thickness. Multiple calcifications with posterior acoustic shadowing identified within the dependent portion of the gallbladder measuring up to 1.18 cm. Sludge noted within the gallbladder. Negative sonographic Murphy's sign. Common bile duct: Diameter: 2.4 mm. Liver: Normal parenchymal echogenicity. Perihepatic ascites noted. Cyst in left hepatic lobe measures 2.8 cm. Portal vein is patent on color Doppler imaging with normal direction of blood flow towards the liver. Other: None. IMPRESSION: 1. Diffuse gallbladder wall edema and gallstones. Negative sonographic Murphy's sign. Findings may reflect acute cholecystitis. However, gallbladder wall edema may also be seen in the setting of hepatocellular disease, anasarca and hypoproteinemia. 2. Ascites. 3. Left hepatic lobe cyst. Electronically Signed   By: Kerby Moors M.D.   On: 09/02/2022 12:53   CT CHEST ABDOMEN PELVIS W CONTRAST  Result Date: 09/02/2022 CLINICAL DATA:  Sepsis.  Hypotension with  loss of consciousness. EXAM: CT CHEST, ABDOMEN, AND PELVIS WITH CONTRAST TECHNIQUE: Multidetector CT imaging of the chest, abdomen and pelvis was performed following the standard protocol during bolus administration of intravenous contrast. RADIATION DOSE REDUCTION: This exam was performed according to the departmental dose-optimization program which includes automated exposure control, adjustment of the mA and/or kV according to patient size and/or use of iterative reconstruction technique. CONTRAST:  42mL OMNIPAQUE IOHEXOL 300 MG/ML  SOLN COMPARISON:  CT AP 09/02/2018 FINDINGS: CT CHEST FINDINGS Cardiovascular: There is a right chest wall pacer device with leads in the right atrial appendage and right ventricle. Moderate cardiac enlargement. Aortic atherosclerosis and coronary artery calcifications. No pericardial effusion. Mediastinum/Nodes: Thyroid gland, trachea, and esophagus are unremarkable. No enlarged axillary, mediastinal, or hilar  lymph nodes. Lungs/Pleura: Small bilateral pleural effusions identified. Atelectasis and or scarring noted within the posterior and medial right lower lobe. Splenic flexure into the left lower chest.  There is compressive type atelectasis within the lingula and left lower lobe associated with the large hernia. Subsegmental atelectasis versus scarring noted within the posterior and medial right lower lobe. Mild heterogeneous ground-glass attenuation identified within the upper lobes. No airspace consolidation identified. Musculoskeletal: Thoracic spondylosis noted. No acute or suspicious osseous findings. CT ABDOMEN PELVIS FINDINGS Hepatobiliary: Liver cysts identified, unchanged from previous exam. The largest is in the lateral segment of left lobe measuring 2.5 cm. There are stones identified within the dependent portion of the gallbladder. The gallbladder wall appears diffusely edematous. Pericholecystic and perihepatic ascites noted. Pancreas: Unremarkable. No pancreatic ductal dilatation or surrounding inflammatory changes. Spleen: Normal in size without focal abnormality. Adrenals/Urinary Tract: The adrenal glands are unremarkable. No nephrolithiasis, hydronephrosis or kidney mass. Exam detail is markedly diminished within the pelvis due to streak artifact from hip arthroplasty device. Urinary bladder is completely obscured Stomach/Bowel: 100% intrathoracic stomach is identified secondary to large para soft a GIA learn E a. there is no pathologic dilatation of the large or small bowel loops to suggest obstruction. The appendix is not visualized separate from the right lower quadrant bowel loops. No bowel wall thickening, inflammation, or distension. Sigmoid diverticulosis without signs of acute diverticulitis. Moderate stool burden identified within the rectum. Vascular/Lymphatic: Aortic atherosclerosis. No aneurysm. No abdominopelvic adenopathy. Reproductive: Sub optimal visualization of the uterus. 5.3 x 5.0  cm left ovary cyst is again noted. Previously 4.9 x 4.2 cm. Other: Small volume of perihepatic ascites extends along the right lower quadrant of the abdomen and into the pelvis. No focal fluid collection identified. No signs of pneumoperitoneum. Musculoskeletal: Status post bilateral hip arthroplasty. Bones appear diffusely osteopenic. Mild superior endplate deformity involving the L3 vertebral body is new from 09/02/18 IMPRESSION: 1. Gallstones with gallbladder wall edema and pericholecystic and perihepatic ascites. Correlate for any clinical signs or symptoms of acute cholecystitis. 2. Small bilateral pleural effusions. Heterogeneous ground-glass attenuation noted within the upper lung zones. Correlate for any clinical signs/symptoms of CHF. 3. Large paraesophageal hernia contains 100% intrathoracic stomach as well as splenic flexure of the colon. 4. Mild superior endplate deformity involving the L3 vertebral body is new from 09/02/18. 5. Mild increase in size of left ovary cyst. Recommend further evaluation with nonemergent, outpatient pelvic sonogram. 6. Sigmoid diverticulosis without signs of acute diverticulitis. 7. Moderate stool burden identified within the rectum. Correlate for any clinical signs or symptoms of constipation. 8.  Aortic Atherosclerosis (ICD10-I70.0). Electronically Signed   By: Kerby Moors M.D.   On: 09/02/2022 10:44   DG Chest Portable 1 View  Result Date: 09/02/2022 CLINICAL DATA:  Hypotension and vomiting, initial encounter EXAM: PORTABLE CHEST 1 VIEW COMPARISON:  08/12/2015 FINDINGS: Cardiac shadow is enlarged. Pacing device is noted. Aortic calcifications are seen. The lungs are well aerated bilaterally. Elevation of left hemidiaphragm is seen. No bony abnormality is noted. IMPRESSION: No active disease. Electronically Signed   By: Inez Catalina M.D.   On: 09/02/2022 03:56    Labs:  CBC: Recent Labs    09/02/22 0318 09/02/22 0425 09/03/22 0319 09/04/22 0337  WBC 27.2*  41.2* 58.4* 49.8*  HGB 6.9* 8.6* 10.6* 10.2*  HCT 22.4* 27.7* 31.6* 30.6*  PLT 56* 65* 64* 56*    COAGS: Recent Labs    09/02/22 0425  INR 1.5*  APTT 38*    BMP: Recent Labs    09/02/22 0318 09/02/22 0425 09/03/22 0319 09/04/22 0337  NA 142 138 134* 131*  K 3.1* 4.2 4.8 4.5  CL 114*  104 104 103  CO2 14* 19* 19* 16*  GLUCOSE 94 113* 145* 133*  BUN 26* 32* 53* 58*  CALCIUM 7.2* 9.0 8.3* 7.3*  CREATININE 0.83 1.17* 2.02* 2.90*  GFRNONAA >60 46* 24* 15*    LIVER FUNCTION TESTS: Recent Labs    02/02/22 0915 07/11/22 0953 09/02/22 0318 09/03/22 0319 09/04/22 0337  BILITOT 0.3 0.5 0.9  --   --   AST 37 33 441*  --   --   ALT 30 23 376*  --   --   ALKPHOS 60 49 81  --   --   PROT 7.8 7.2 4.7*  --   --   ALBUMIN 4.1 4.0 2.4* 3.4* 2.8*    Assessment and Plan:  Acute cholecystitis s/p percutaneous cholecystostomy drain placement 09/03/22 Patient is being transitioned to home hospice care with expected prognosis per Palliative team of <2 weeks. Her perc chole drain appears to be functioning well at this time, though the patient's daughter notes that the bag has leaked a few times due to the drain lock becoming loose. Due to patient's prognosis, it is not expected that patient will need percutaneous cholecystostomy drain exchange in 6-8 weeks, but order for follow-up has been placed in the event that it is needed. Please contact IR team with any questions or concerns.  Electronically Signed: Lura Em, PA-C 09/04/2022, 4:33 PM   I spent a total of 15 Minutes at the the patient's bedside AND on the patient's hospital floor or unit, greater than 50% of which was counseling/coordinating care for acute cholecystitis.

## 2022-09-04 NOTE — IPAL (Signed)
Interdisciplinary Goals of Care Family Meeting   Date carried out: 09/04/2022  Location of the meeting: Bedside  Member's involved: Physician and Family Member or next of kin  Durable Power of Attorney or acting medical decision maker: Patient, [name].    Discussion: We discussed goals of care for Vickie Howard .  Present at the bedside were the patient's five adult children. Patient reported feeling tired and not wanting to prolong her dying process. We explained her current medical condition that includes sepsis from cholecystitis, HFrEF, aortic stenosis, NSTEMI, and AKI. Explained that our options were limited, especially that the patient did not want any invasive medical interventions. I explained that invasive procedures would not necessarily prolong her life and could inflict suffering. Patient and family are in agreement, patient's wishes are not to have any invasive procedures.  I have recommended hospice care to them, and she believes this is in line with her wishes. I explained that we would focus our care on her comfort, and this care could be delivered at home. Also explained that should her kidney function improve spontaneously, she could receive diuretics to help with her shortness of breath. We will consult palliative care to help coordinate transition to home with hospice.  Code status:   Code Status: DNR   Disposition: Home with Hospice  Time spent for the meeting: 75 minutes   Armando Reichert, MD  09/04/2022, 12:46 PM

## 2022-09-04 NOTE — Progress Notes (Signed)
NAME:  Vickie Howard, MRN:  [id number], DOB:  [birth year], LOS: 2 ADMISSION DATE:  09/02/2022, CONSULTATION DATE:  09/02/2022 REFERRING MD:  Dr. Tamala Julian, CHIEF COMPLAINT:  Shock   History of Present Illness:  87 year old female with extensive co-morbities including A.Fib, Systolic/Diastolic HF (most recent EF 60-65 2017), Aortic stenosis, RA (recently started on methotrexate), CMML. Presents to ED on 3/30. Patient reportedly feeling sick, woke up called daughter to come over, when daughter arrived patient was diaphoretic and minimally responsive. On arrival to ED BP 44/20 with A.Fib RVR (HR 130s).  Shocked with 200J. After requiring NEO gtt, given 1L bolus. EDP spoke with [employer] who noted quiet a few arrhthymias 04/2021 until now, this AM with nonsustained Vtach x 2 for 1-3 seconds. Critical Care Consulted for admission.   Labs revealing for K 3.1, Bicarb 14, BUN 26, Crt 0.83. Mag 1.6. AST/ALT 441/376. WBC 41.2. Hemoglobin 8.6. PLT 65. Troponin 110. Lactic Acid 5.7  Per Family patient lives independently however with some short term memory loss. She has poor oral intake secondary to large hernia and GERD. Patient herself reports progressive weakness, nausea, denies ABD pain, dose state long history of black stools (previously on iron tabs but has been off for a while). Family understanding to multiple co-morbities, does not want patient to suffer, at this time would like to continue medical management.   Pertinent  Medical History  P.A-fib, aortic stenosis (has refused TAVR in past), junctional bradycardia, pacemaker, combined CHF, CMML (followed by Dr Grayland Ormond, diagnosed 2020), hiatal hernia, chronic anemia, thrombocytopenia, HLD, GERD   Significant Hospital Events: Including procedures, antibiotic start and stop dates in addition to other pertinent events   3/30 > Presents to ED with syncope, shock, wide complex tachycardia  3/31: IR Per chole; remains on levophed 4/1: Remains on low dose  Levophed.  AKI worsening with oliguria and metabolic acidosis.  Worsening SOB, pt and family considering shifting more towards comfort.  MICRO Data:  3/30: RVP>>negative 3/30: Blood cultures>> no growth to date 3/30: MRSA PCR>>negative 3/30: Urine>> no growth 3/31: Gallbladder/bile>>  Antimicrobials:   Anti-infectives (From admission, onward)    Start     Dose/Rate Route Frequency Ordered Stop   09/04/22 1200  vancomycin (VANCOREADY) IVPB 750 mg/150 mL  Status:  Discontinued        750 mg 150 mL/hr over 60 Minutes Intravenous Every 48 hours 09/02/22 0848 09/02/22 1435   09/03/22 1400  piperacillin-tazobactam (ZOSYN) IVPB 2.25 g        2.25 g 100 mL/hr over 30 Minutes Intravenous Every 8 hours 09/03/22 0736     09/02/22 1600  piperacillin-tazobactam (ZOSYN) IVPB 3.375 g  Status:  Discontinued        3.375 g 12.5 mL/hr over 240 Minutes Intravenous Every 8 hours 09/02/22 1239 09/03/22 0736   09/02/22 0900  vancomycin (VANCOCIN) IVPB 1000 mg/200 mL premix        1,000 mg 200 mL/hr over 60 Minutes Intravenous  Once 09/02/22 0848 09/02/22 1315   09/02/22 0815  ceFEPIme (MAXIPIME) 1 g in sodium chloride 0.9 % 100 mL IVPB        1 g 200 mL/hr over 30 Minutes Intravenous  Once 09/02/22 0813 09/02/22 1054       Interim History / Subjective:  -No significant events noted overnight -This morning reports shortness of breath following exertion (was just cleaned by nursing for incontinence) -Afebrile, remains on low dose levophed (3 mcg), on room air -Creatinine worsened at 2.9  from 2, serum bicarb worsened to 16 from 19 ~ no UOP documented (net + 5.9L), nursing reports no voids, bladder scan with approximately 200 cc in bladder -Leukocytosis improved to 49.8 from 58 following cholecystomy tube yesterday -Pt and family confirms they do not wish to pursue HD or any invasive procedures ~ seems they are considering shifting to more of a comfort approach   Objective   Blood pressure (!)  91/53, pulse (!) 59, temperature 97.8 F (36.6 C), resp. rate (!) 28, height 4\' 11"  (1.499 m), weight 43.9 kg, SpO2 98 %.        Intake/Output Summary (Last 24 hours) at 09/04/2022 0827 Last data filed at 09/04/2022 0725 Gross per 24 hour  Intake 2494.58 ml  Output 130 ml  Net 2364.58 ml    Filed Weights   09/02/22 0306 09/02/22 1300 09/03/22 0451  Weight: 40.8 kg 44 kg 43.9 kg    Examination: General: Acute on chronically ill appearing frail Elderly female, lying in bed, with mild SOB but no acute distress HENT: Atraumatic, normocephalic, neck supple, no JVD, Dry MM  Lungs: Coarse breath sounds, no rhonchi or wheezing noted, mild tachypnea and use of accessory muslces  Cardiovascular: Paced rhythm, systolic murmur  Abdomen: soft, non-tender, active bowel sounds, abdominal drain in place with bile colored drainage Extremities: no deformities, no edema Neuro: alert and awake, oriented x3, follows commands, no focal deficits noted, speech clear  GU: intact  Resolved Hospital Problem list     Assessment & Plan:   #Shock: most likely Septic +/- Cardiogenic #NSTEMI, most consistent with Type 2  #Wide-Complex Tachycardia ~ RESOLVED #Atrial Fibrillation #Acute on Chronic combined Systolic & Diastolic CHF (EF 99991111) #Severe Aortic Stenosis PMHx: HTN, sick sinus syndrome s/p Pacemaker placement Echocardiogram 09/03/22: LVEF <20%, indeterminate diastolic parameters, RV systolic function low normal, moderate pulmonary htn, moderate to severe MR, mild to moderate TR, severe aortic stenosis -Continuous cardiac monitoring -Maintain MAP >65 -Vasopressors as needed to maintain MAP goal -Continue Midodrine -Trend lactic acid until normalized -Trend HS Troponin until peaked -Cardiology following, appreciate input -Continue Heparin and Amiodarone drips as per Cardiology  #Severe Sepsis in setting of Acute Cholecystitis CT C/A/P with some gallstones, gallbladder edema,  pericholecystic/perihepatic ascites S/p Percutaneous Cholecystostomy Tube placement on 3/31 by IR -Monitor fever curve -Trend WBC's & Procalcitonin -Follow cultures as above -Continue empiric Zosyn pending cultures & sensitivities  #Acute Kidney Injury #Anion Gap Metabolic Acidosis  #Lactic Acidosis  #Mild Hyponatremia -Monitor I&O's / urinary output -Follow BMP -Ensure adequate renal perfusion -Avoid nephrotoxic agents as able -Replace electrolytes as indicated -Consider Bicarb gtt -Pt and family confirm they would NOT wish to pursue Hemodialysis  #H/O GI bleeds, not tolerating AC in past #Chronic Anemia, Thrombocytopenia  #CMML  -Monitor for S/Sx of bleeding -Trend CBC -Heparin gtt for Anticoagulation/VTE Prophylaxis in setting of A.fib -Transfuse for Hgb <7  #Hypothyroidism  TSH elevated at 7.2, however Free T4 WNL at 1.1 -Continue Sythroid   #RA -Hold Methotrexate and Hydroxychloroquine      Pt is critically ill with multiorgan failure superimposed on multiple chronic co-morbidities (A.fib not tolerating AC, severe aortic stenosis, chronic combined systolic & diastolic CHF, CMML).  Prognosis is extremely guarded, high risk for cardiac arrest and death.  Overall long term prognosis is poor.  Pt is DNR/DNI. Recommend consideration of comfort measures. Will consult Palliative Care to assist with further [place] discussions.   Best Practice (right click and "Reselect all SmartList Selections" daily)   Diet/type: as  tolerated DVT prophylaxis: systemic heparin GI prophylaxis: N/A Lines: N/A Foley:  N/A Code Status:  DNR Last date of multidisciplinary goals of care discussion: 4/1  4/1: Pt and her son updated at bedside.  All questions answered.  They both confirm DNR/DNI.  Currently they would like to continue current medical interventions, but would not want Hemodialysis or other invasive procedures. She endorses quality of life is very important to her, and is starting  to consider more of a comfort approach.  Labs   CBC: Recent Labs  Lab 09/02/22 0318 09/02/22 0425 09/03/22 0319 09/04/22 0337  WBC 27.2* 41.2* 58.4* 49.8*  NEUTROABS 18.1*  --   --   --   HGB 6.9* 8.6* 10.6* 10.2*  HCT 22.4* 27.7* 31.6* 30.6*  MCV 110.9* 109.5* 96.9 99.4  PLT 56* 65* 64* 56*     Basic Metabolic Panel: Recent Labs  Lab 09/02/22 0318 09/02/22 0425 09/03/22 0319 09/04/22 0337  NA 142 138 134* 131*  K 3.1* 4.2 4.8 4.5  CL 114* 104 104 103  CO2 14* 19* 19* 16*  GLUCOSE 94 113* 145* 133*  BUN 26* 32* 53* 58*  CREATININE 0.83 1.17* 2.02* 2.90*  CALCIUM 7.2* 9.0 8.3* 7.3*  MG 1.6* 2.2 2.5* 2.5*  PHOS  --  4.6 4.9* 5.3*    GFR: Estimated Creatinine Clearance: 9.7 mL/min (A) (by C-G formula based on SCr of 2.9 mg/dL (H)). Recent Labs  Lab 09/02/22 0318 09/02/22 0425 09/02/22 0425 09/02/22 0855 09/02/22 1546 09/02/22 1756 09/02/22 2223 09/03/22 0319 09/04/22 0337  PROCALCITON  --  0.23  --   --   --   --   --   --   --   WBC 27.2* 41.2*  --   --   --   --   --  58.4* 49.8*  LATICACIDVEN  --  5.7*   < > 3.2* 2.8* 3.0* 3.1*  --   --    < > = values in this interval not displayed.     Liver Function Tests: Recent Labs  Lab 09/02/22 0318 09/03/22 0319 09/04/22 0337  AST 441*  --   --   ALT 376*  --   --   ALKPHOS 81  --   --   BILITOT 0.9  --   --   PROT 4.7*  --   --   ALBUMIN 2.4* 3.4* 2.8*    No results for input(s): "LIPASE", "AMYLASE" in the last 168 hours. No results for input(s): "AMMONIA" in the last 168 hours.  ABG No results found for: "PHART", "PCO2ART", "PO2ART", "HCO3", "TCO2", "ACIDBASEDEF", "O2SAT"   Coagulation Profile: Recent Labs  Lab 09/02/22 0425  INR 1.5*     Cardiac Enzymes: No results for input(s): "CKTOTAL", "CKMB", "CKMBINDEX", "TROPONINI" in the last 168 hours.  HbA1C: Hgb A1c MFr Bld  Date/Time Value Ref Range Status  08/09/2015 11:47 AM 5.3 4.0 - 6.0 % Final    CBG: Recent Labs  Lab  09/03/22 0634 09/03/22 1201 09/03/22 1803 09/03/22 2355 09/04/22 0512  GLUCAP 97 99 102* 119* 116*     Review of Systems:   +weakness, denies cough. SOB with exertion, generalized weakness    Past Medical History:  She,  has a past medical history of Anemia, Arthritis, Atrial fibrillation ([place]), GERD (gastroesophageal reflux disease), Heart murmur, History of hiatal hernia, HLD (hyperlipidemia), HTN (hypertension), and Occasional tremors.   Surgical History:   Past Surgical History:  Procedure Laterality Date   IR PERC  CHOLECYSTOSTOMY  09/03/2022   JOINT REPLACEMENT     right total hip 2015   Left total hip arthroplasty  10/12/2014   PACEMAKER INSERTION N/A 08/12/2015   Procedure: INSERTION PACEMAKER attempt unable to obtain access ;  Surgeon: Isaias Cowman, MD;  Location: [facility] ORS;  Service: Cardiovascular;  Laterality: N/A;   TOTAL HIP ARTHROPLASTY Left 10/12/2014   Procedure: TOTAL HIP ARTHROPLASTY;  Surgeon: Dereck Leep, MD;  Location: [facility] ORS;  Service: Orthopedics;  Laterality: Left;   TUBAL LIGATION       Social History:   reports that she has never smoked. She has never used smokeless tobacco. She reports that she does not drink alcohol and does not use drugs.   Family History:  Her family history includes Aneurysm in her father; Breast cancer in her daughter.   Allergies Allergies  Allergen Reactions   Codeine Sulfate Other (See Comments)    Reaction:  Syncope   Levaquin [Levofloxacin] Nausea And Vomiting   Oxycodone Other (See Comments)    Reaction:  Disoriented      Home Medications  Prior to Admission medications   Medication Sig Start Date End Date Taking? Authorizing Provider  acetaminophen (TYLENOL) 500 MG tablet Take 1,000 mg by mouth every 6 (six) hours as needed for mild pain or headache.   Yes [provider]  aspirin EC 81 MG tablet Take 81 mg by mouth every other day.    Yes [provider]  atorvastatin (LIPITOR) 20 MG  tablet Take 20 mg by mouth daily.   Yes [provider]  Cyanocobalamin (VITAMIN B 12 PO) Take 1 mcg by mouth daily.   Yes [provider]  donepezil (ARICEPT) 5 MG tablet Take 5 mg by mouth daily. 09/11/19  Yes [provider]  hydroxychloroquine (PLAQUENIL) 200 MG tablet Take 200 mg by mouth daily.   Yes [provider]  docusate sodium (COLACE) 100 MG capsule Take 100 mg by mouth 2 (two) times daily.    [provider]  feeding supplement, ENSURE ENLIVE, (ENSURE ENLIVE) LIQD Take 237 mLs by mouth 2 (two) times daily between meals. 08/13/15   Bettey Costa, MD  ferrous sulfate 325 (65 FE) MG tablet Take 325 mg by mouth daily with breakfast. Patient not taking: Reported on 03/07/2022    [provider]  folic acid (FOLVITE) 1 MG tablet Take 1 mg by mouth daily. 08/11/22 08/06/23  [provider]  LANOXIN 62.5 MCG TABS TK 1 T PO ONCE D Patient not taking: Reported on 11/01/2021 01/16/18   [provider]  levothyroxine (SYNTHROID) 25 MCG tablet Take 1 tablet (25 mcg total) by mouth once daily Take on an empty stomach with a glass of water at least 30-60 minutes before breakfast. 08/14/22 08/14/23  [provider]  lisinopril (PRINIVIL,ZESTRIL) 2.5 MG tablet Take 2.5 mg by mouth daily.  01/03/18   [provider]  Magnesium 200 MG TABS Take 200 mg by mouth daily. Patient not taking: Reported on 07/11/2022    [provider]  methotrexate (RHEUMATREX) 2.5 MG tablet Take 12.5 mg by mouth once a week.    [provider]  mirtazapine (REMERON) 15 MG tablet 30 mg daily. 12/25/18   [provider]  Multiple Vitamin (MULTI-VITAMIN) tablet Take 1 tablet by mouth 1 day or 1 dose.    [provider]  Multiple Vitamins-Minerals (PRESERVISION AREDS 2) CAPS Take 1 capsule by mouth daily.    [provider]  pramipexole (MIRAPEX)  0.125 MG tablet Take by mouth. Patient not taking: Reported on  07/11/2022 09/26/21   [provider]  sertraline (ZOLOFT) 100 MG tablet Take 100 mg by mouth daily. Patient taking 100 mg daily.    [provider]  tiZANidine (ZANAFLEX) 2 MG tablet Take 2 mg by mouth at bedtime. Patient not taking: Reported on 03/07/2022 10/29/21   [provider]     Critical care time: 40 minutes     CRITICAL CARE Performed by: Bradly Bienenstock   Total critical care time: 40 minutes  Critical care time was exclusive of separately billable procedures and treating other patients.  Critical care was necessary to treat or prevent imminent or life-threatening deterioration.  Critical care was time spent personally by me on the following activities: development of treatment plan with patient and/or surrogate as well as nursing, discussions with consultants, evaluation of patient's response to treatment, examination of patient, obtaining history from patient or surrogate, ordering and performing treatments and interventions, ordering and review of laboratory studies, ordering and review of radiographic studies, pulse oximetry and re-evaluation of patient's condition.  Darel Hong, AGACNP-BC [facility] Prefer epic messenger for cross cover needs If after hours, please call E-link

## 2022-09-04 NOTE — Progress Notes (Signed)
[place] for initiation and monitoring of heparin infusion Indication: chest pain/ACS  Allergies  Allergen Reactions   Codeine Sulfate Other (See Comments)    Reaction:  Syncope   Levaquin [Levofloxacin] Nausea And Vomiting   Oxycodone Other (See Comments)    Reaction:  Disoriented     Patient Measurements: Height: 4\' 11"  (149.9 cm) Weight: 43.9 kg (96 lb 12.5 oz) IBW/kg (Calculated) : 43.2 Heparin Dosing Weight: 40.8 kg  Vital Signs: Temp: 97 F (36.1 C) (04/01 0800) BP: 104/58 (04/01 1100) Pulse Rate: 58 (04/01 1100)  Labs: Recent Labs    09/02/22 0425 09/02/22 0855 09/02/22 1547 09/02/22 1756 09/02/22 1756 09/03/22 0319 09/04/22 0036 09/04/22 0337 09/04/22 1042  HGB 8.6*  --   --   --   --  10.6*  --  10.2*  --   HCT 27.7*  --   --   --   --  31.6*  --  30.6*  --   PLT 65*  --   --   --   --  64*  --  56*  --   APTT 38*  --   --   --   --   --   --   --   --   LABPROT 18.4*  --   --   --   --   --   --   --   --   INR 1.5*  --   --   --   --   --   --   --   --   HEPARINUNFRC  --   --   --  0.21*   < > 0.22* 0.20*  --  0.30  CREATININE 1.17*  --   --   --   --  2.02*  --  2.90*  --   TROPONINIHS 356* 9,141* >24,000* >24,000*  --   --   --   --   --    < > = values in this interval not displayed.     Estimated Creatinine Clearance: 9.7 mL/min (A) (by C-G formula based on SCr of 2.9 mg/dL (H)).   Medical History: Past Medical History:  Diagnosis Date   Anemia    Arthritis    Atrial fibrillation (HCC)    GERD (gastroesophageal reflux disease)    Heart murmur    History of hiatal hernia    HLD (hyperlipidemia)    HTN (hypertension)    Occasional tremors    hands    Assessment: 87 y.o. female w/ PMH of Anemia, Arthritis, Atrial fibrillation, GERD, Heart murmur, History of hiatal hernia, HLD, HTN, and Occasional tremors admitted on 09/02/2022 with sepsis. A review of medical records reveals no chronic  anticoagulation prior to admission   Goal of Therapy:  Heparin level 0.3-0.7 units/ml Monitor platelets by anticoagulation protocol: Yes  Results: 03/30 @ 1756 HL 0.21, subtherapeutic @ 500 u/hr 03/31 @ 0319 HL 0.22, subtherapeutic @ 550 u/hr 04/01 @ 0036 HL 0.20, SUBtherapeutic @ 650 u/hr 04/01 @1042  HL 0.30, therapeutic x 1   Plan: heparin level therapeutic x 1 --Continue IV heparin 750 units/hr --Recheck HL 8 hrs after previous level ---Daily CBC while on heparin  Glean Salvo, PharmD, BCPS Clinical Pharmacist  09/04/2022 11:24 AM

## 2022-09-04 NOTE — Progress Notes (Signed)
Assessment limited due to comfort care orders in place.

## 2022-09-04 NOTE — Progress Notes (Signed)
[facility]) Hospital Liaison Note  Received request from PMT provider Leafy Half., NP, for hospice services at home after discharge via [facility] aware). Chart and patient information under review by [facility] physician.   Spoke with daughter/Deztinee to initiate education related to hospice philosophy, services, and team approach to care. Ramisa verbalized understanding of information given. Per discussion, the plan is for MSW to meet with patient's children on 4.2 @ 1030 to ensure all involved have clear understanding of hospice philosophy.    DME needs discussed. Patient has the following equipment in the home: N/a Patient requests the following equipment for delivery: O2 @ 2L  Hospital bed  Address verified and is correct in the chart. Yunalesca is the family member to contact to arrange time of equipment delivery.    Please send signed and completed DNR home with patient/family. Please provide prescriptions at discharge as needed to ensure ongoing symptom management.    [facility] information and contact numbers given to family & above information shared with TOC.   Please call with any questions/concerns.    Thank you for the opportunity to participate in this patient's care.   Phillis Haggis, MSW [facility] Liaison  [phone]

## 2022-09-04 NOTE — Progress Notes (Signed)
[place] for initiation and monitoring of heparin infusion Indication: chest pain/ACS  Allergies  Allergen Reactions   Codeine Sulfate Other (See Comments)    Reaction:  Syncope   Levaquin [Levofloxacin] Nausea And Vomiting   Oxycodone Other (See Comments)    Reaction:  Disoriented     Patient Measurements: Height: 4\' 11"  (149.9 cm) Weight: 43.9 kg (96 lb 12.5 oz) IBW/kg (Calculated) : 43.2 Heparin Dosing Weight: 40.8 kg  Vital Signs: Temp: 99.1 F (37.3 C) (04/01 0000) BP: 92/59 (04/01 0100) Pulse Rate: 66 (04/01 0100)  Labs: Recent Labs    09/02/22 0318 09/02/22 0425 09/02/22 0855 09/02/22 1547 09/02/22 1756 09/03/22 0319 09/04/22 0036  HGB 6.9* 8.6*  --   --   --  10.6*  --   HCT 22.4* 27.7*  --   --   --  31.6*  --   PLT 56* 65*  --   --   --  64*  --   APTT  --  38*  --   --   --   --   --   LABPROT  --  18.4*  --   --   --   --   --   INR  --  1.5*  --   --   --   --   --   HEPARINUNFRC  --   --   --   --  0.21* 0.22* 0.20*  CREATININE 0.83 1.17*  --   --   --  2.02*  --   TROPONINIHS 110* 356* 9,141* >24,000* >24,000*  --   --      Estimated Creatinine Clearance: 13.9 mL/min (A) (by C-G formula based on SCr of 2.02 mg/dL (H)).   Medical History: Past Medical History:  Diagnosis Date   Anemia    Arthritis    Atrial fibrillation (HCC)    GERD (gastroesophageal reflux disease)    Heart murmur    History of hiatal hernia    HLD (hyperlipidemia)    HTN (hypertension)    Occasional tremors    hands    Assessment: 87 y.o. female w/ PMH of Anemia, Arthritis, Atrial fibrillation, GERD, Heart murmur, History of hiatal hernia, HLD, HTN, and Occasional tremors admitted on 09/02/2022 with sepsis. A review of medical records reveals no chronic anticoagulation prior to admission   Goal of Therapy:  Heparin level 0.3-0.7 units/ml Monitor platelets by anticoagulation protocol: Yes  Results: 03/30 @ 1756 HL 0.21,  subtherapeutic @ 500 u/hr 03/31 @ 0319 HL 0.22, subtherapeutic @ 550 u/hr 04/01 @ 0036 HL 0.20, SUBtherapeutic @ 650 u/hr   Plan: 4/01:  HL @ 0036 = 0.20, subtherapeutic - Will order heparin 600 units IV X 1 bolus and increase drip rate 750 units/hr - Will recheck HL 8 hrs after rate change ---Daily CBC while on heparin  Treston Coker D, PharmD 09/04/2022,1:16 AM

## 2022-09-04 NOTE — Consult Note (Signed)
Consultation Note Date: 09/04/2022   Patient Name: Vickie Howard  DOB: Jul 06, 1935  MRN: [id number]  Age / Sex: 87 y.o., female  PCP: Adin Hector, MD Referring Physician: Armando Reichert, MD  Reason for Consultation:  goals of care  HPI/Patient Profile: 87 y.o. female  with past medical history of a fib, HF, aortic stenosis, RN, CML admitted on 09/02/2022 with likely cardiogenic shock, NSTEMI, afib with RVR, acute cholecystitis s/p drain placement. Acute kidney injury worsening during hospitalization. Palliative consulted for goals of care.    Primary Decision Maker PATIENT  Discussion: Chart reviewed including labs, progress notes, imaging from this and previous encounters.  Met with patient, her daughter, son and pastor at bedside.  Patient and family are very clear in their desire to not prolong patient's dying process. She wants to go home as soon as possible with hospice care.  Discussed transition to comfort measures only which includes stopping IV fluids, antibiotics, labs and providing symptom management for SOB, anxiety, nausea, vomiting, and other symptoms of dying. Patient is short of breath currently.  Patient and family would like to transition care now to full comfort measures only and request hospice services at home.     SUMMARY OF RECOMMENDATIONS -Transition to full comfort measures only- d/c medications and interventions that are not contributing to comfort, d/c antibiotics, wean off pressors -Attending team to change amiodorone to po -Start morphine 5mg  solution q1hr prn for SOB- typically would avoid morphine in AKI- but she has allergy to oxycodone and need opioid for home use- will monitor closely for neurotoxicity -Lorazepam 0.5mg  po q6hrs prn anxiety or for sleep -Having urinary retention- place foley catheter for comfort and end of life care -Unrestricted visitation  Code  Status/Advance Care Planning: DNR   Prognosis:   < 2 weeks  Discharge Planning: Home with Hospice  Primary Diagnoses: Present on Admission:  Wide-complex tachycardia  Atrial fibrillation with RVR   Review of Systems  Physical Exam  Vital Signs: BP 111/71   Pulse 60   Temp (!) 97 F (36.1 C)   Resp (!) 27   Ht 4\' 11"  (1.499 m)   Wt 43.9 kg   SpO2 95%   BMI 19.55 kg/m  Pain Scale: 0-10   Pain Score: 0-No pain   SpO2: SpO2: 95 % O2 Device:SpO2: 95 % O2 Flow Rate: .O2 Flow Rate (L/min): 2 L/min  IO: Intake/output summary:  Intake/Output Summary (Last 24 hours) at 09/04/2022 1525 Last data filed at 09/04/2022 0900 Gross per 24 hour  Intake 2524.58 ml  Output 160 ml  Net 2364.58 ml    LBM: Last BM Date : 09/04/22 Baseline Weight: Weight: 40.8 kg Most recent weight: Weight: 43.9 kg       Thank you for this consult. Palliative medicine will continue to follow and assist as needed.  Time Total: 90 minutes Greater than 50%  of this time was spent counseling and coordinating care related to the above assessment and plan.  Signed by: Mariana Kaufman, AGNP-C Palliative  Medicine    Please contact Palliative Medicine Team phone at [phone] for questions and concerns.  For individual provider: See Shea Evans

## 2022-09-05 DIAGNOSIS — R000 Tachycardia, unspecified: Secondary | ICD-10-CM | POA: Diagnosis not present

## 2022-09-05 DIAGNOSIS — Z515 Encounter for palliative care: Secondary | ICD-10-CM | POA: Diagnosis not present

## 2022-09-05 DIAGNOSIS — N179 Acute kidney failure, unspecified: Secondary | ICD-10-CM | POA: Diagnosis not present

## 2022-09-05 DIAGNOSIS — A419 Sepsis, unspecified organism: Secondary | ICD-10-CM | POA: Diagnosis not present

## 2022-09-05 MED ORDER — MORPHINE SULFATE (CONCENTRATE) 10 MG/0.5ML PO SOLN: 5.0000 mg | ORAL | 0 refills | Status: DC | PRN

## 2022-09-05 MED ORDER — HALOPERIDOL LACTATE 2 MG/ML PO CONC: 0.5000 mg | ORAL | 0 refills | Status: DC | PRN

## 2022-09-05 MED ORDER — LORAZEPAM 2 MG/ML PO CONC: 1.0000 mg | ORAL | 0 refills | Status: DC | PRN

## 2022-09-05 MED ORDER — LORAZEPAM 2 MG/ML PO CONC
1.0000 mg | ORAL | Status: DC | PRN
  Administered 2022-09-05: 1 mg via SUBLINGUAL
  Filled 2022-09-05: qty 1

## 2022-09-05 MED ORDER — HALOPERIDOL LACTATE 2 MG/ML PO CONC
0.5000 mg | ORAL | Status: DC | PRN
  Filled 2022-09-05: qty 5

## 2022-09-05 NOTE — TOC Transition Note (Signed)
Transition of Care [facility]) - CM/SW Discharge Note   Patient Details  Name: Vickie Howard MRN: [id number] Date of Birth: 1935/06/11  Transition of Care [facility]) CM/SW Contact:  Gerilyn Pilgrim, LCSW Phone Number: 09/05/2022, 11:18 AM   Clinical Narrative:  Pt to discharge today with [facility] home hospice. Medical necessity printed to the unit. [facility] will be called once DME is delivered to patients home.            Patient Goals and CMS Choice      Discharge Placement                         Discharge Plan and Services Additional resources added to the After Visit Summary for                                       Social Determinants of Health (SDOH) Interventions SDOH Screenings   Tobacco Use: Low Risk  (09/03/2022)     Readmission Risk Interventions     No data to display

## 2022-09-05 NOTE — Care Management Important Message (Signed)
Important Message  Patient Details  Name: TAMSEN OTERI MRN: [id number] Date of Birth: 1935/08/15   Medicare Important Message Given:  Other (see comment)  On comfort care measures.  Medicare IM withheld at this time out of respect for patient and family.    Dannette Barbara 09/05/2022, 8:17 AM

## 2022-09-05 NOTE — Progress Notes (Signed)
[facility]) Hospital Liaison Note   MSW met with patient & all children and grandchild at bedside on this date. MSW provided extensive education to family of hospice philosophy & confirmed [place]. All present voiced understanding and confirmed that they are no longer seeking life extending/invasive measures. Family shares reflection of patients life-- active listening and words of encouragement provided.   Per family report, DME is anticipated to arrive today @ 2 pm. EMS arranged by MSW for 5:30 pm to d/c home. Family aware.   Please send signed and completed DNR home with patient/family. Please provide prescriptions at discharge as needed to ensure ongoing symptom management.    [facility] information and contact numbers given to family & above information shared with TOC.   Please call with any questions/concerns.    Thank you for the opportunity to participate in this patient's care.   Phillis Haggis, MSW [facility] Liaison  [phone]

## 2022-09-05 NOTE — Discharge Summary (Signed)
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 She also suffered wide complex tachycardia that responded to defibrillation. She's developed a NSTEMI as well as worsening AKI, and has signs of pulmonary edema on her CXR. Palliative Care consulted today, Goals of care conversation today at the bedside in the presence of the patient's children.  Goals of care discussion was made with the family patient has now transition to comfort care measures only with home hospice.  Patient has been seen by hospice team and has been cleared for discharge today for home hospice.  Discussed with patient's family and they have agreed for discontinuation of current chronic medications except for comfort care medications.,       Consultants: Cardiologist, intensivist Procedures performed: As mentioned above Disposition: Hospice care Diet recommendation:  Regular diet DISCHARGE MEDICATION: Allergies as of 09/05/2022       Reactions   Codeine Sulfate Other (See Comments)   Reaction:   Syncope   Levaquin [levofloxacin] Nausea And Vomiting   Oxycodone Other (See Comments)   Reaction:  Disoriented         Medication List     STOP taking these medications    acetaminophen 500 MG tablet Commonly known as: TYLENOL   aspirin EC 81 MG tablet   atorvastatin 20 MG tablet Commonly known as: LIPITOR   docusate sodium 100 MG capsule Commonly known as: COLACE   donepezil 5 MG tablet Commonly known as: ARICEPT   feeding supplement Liqd   ferrous sulfate 325 (65 FE) MG tablet   folic acid 1 MG tablet Commonly known as: FOLVITE   hydroxychloroquine 200 MG tablet Commonly known as: PLAQUENIL   Lanoxin 62.5 MCG Tabs Generic drug: Digoxin   levothyroxine 25 MCG tablet Commonly known as: SYNTHROID   lisinopril 2.5 MG tablet Commonly known as: ZESTRIL   Magnesium 200 MG Tabs   methotrexate 2.5 MG tablet Commonly known as: RHEUMATREX   mirtazapine 15 MG tablet Commonly known as: REMERON   Multi-Vitamin tablet   pramipexole 0.125 MG tablet Commonly known as: MIRAPEX   PreserVision AREDS 2 Caps   sertraline 100 MG tablet Commonly known as: ZOLOFT   tiZANidine 2 MG tablet Commonly known as: ZANAFLEX   VITAMIN B 12 PO       TAKE these medications    haloperidol 2 MG/ML solution Commonly known as: HALDOL Place 0.3 mLs (0.6 mg total) under the tongue every 4 (four) hours as needed for agitation.   LORazepam 2 MG/ML concentrated solution Commonly known as: ATIVAN Place 0.5 mLs (1 mg total) under the tongue every 4 (four) hours as needed for anxiety.   morphine CONCENTRATE  10 MG/0.5ML Soln concentrated solution Place 0.25 mLs (5 mg total) under the tongue every hour as needed for moderate pain, severe pain, anxiety or shortness of breath.        Discharge Exam: Filed Weights   09/02/22 0306 09/02/22 1300 09/03/22 0451  Weight: 40.8 kg 44 kg 43.9 kg   General: Acute on chronically ill appearing frail Elderly female, lying in bed,HENT:  Atraumatic, normocephalic, neck supple, no JVD, Dry MM  Lungs: Coarse breath sounds, no rhonchi or wheezing noted, Cardiovascular: systolic murmur  Abdomen: soft, non-tender, active bowel sounds, Extremities: no deformities, no edema Neuro: Able to answer yes or no questions  Condition at discharge: poor Discharge time spent: greater than 30 minutes.  Signed: Verline Lema, MD [facility] 09/05/2022

## 2022-09-05 NOTE — Progress Notes (Signed)
Daily Progress Note   Patient Name: Vickie Howard       Date: 09/05/2022 DOB: [birth year]  Age: 87 y.o. MRN#: [id number] Attending Physician: Verline Lema, MD Primary Care Physician: Adin Hector, MD Admit Date: 09/02/2022  Reason for Consultation/Follow-up: Establishing goals of care  Patient Profile/HPI:  87 y.o. female  with past medical history of a fib, HF, aortic stenosis, RN, CML admitted on 09/02/2022 with likely cardiogenic shock, NSTEMI, afib with RVR, acute cholecystitis s/p drain placement. Acute kidney injury worsening during hospitalization. Palliative consulted for goals of care.    Subjective: Chart reviewed including labs, progress notes, imaging from this and previous encounters.  Multiple family members at bedside. Patient sleeping. Appears comfortable. Not really eating or drinking- only a few ice chips.  Discussed symptom management and dying process with family.  They have been very pleased with patient's care during hospitalization.     Physical Exam Vitals and nursing note reviewed.  Pulmonary:     Effort: Pulmonary effort is normal.  Neurological:     Comments: Sleeping but arouseable             Vital Signs: BP (!) 105/57   Pulse 67   Temp (!) 97 F (36.1 C)   Resp (!) 26   Ht 4\' 11"  (1.499 m)   Wt 43.9 kg   SpO2 90%   BMI 19.55 kg/m  SpO2: SpO2: 90 % O2 Device: O2 Device: Room Air O2 Flow Rate: O2 Flow Rate (L/min): 2 L/min  Intake/output summary:  Intake/Output Summary (Last 24 hours) at 09/05/2022 1149 Last data filed at 09/05/2022 0900 Gross per 24 hour  Intake 60 ml  Output 280 ml  Net -220 ml   LBM: Last BM Date : 09/04/22 Baseline Weight: Weight: 40.8 kg Most recent weight: Weight: 43.9 kg       Palliative Assessment/Data:  PPS: 20%      Patient Active Problem List   Diagnosis Date Noted   Septic shock 09/04/2022   AKI (acute kidney injury) 09/04/2022   Cholecystitis 09/04/2022   Malnutrition of moderate degree 09/04/2022   Wide-complex tachycardia 09/02/2022   Osteoarthritis 12/30/2018   Osteoporosis 12/30/2018   Encounter for long-term (current) use of high-risk medication [id number]   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   Seronegative arthritis  07/18/2018   CMML (chronic myelomonocytic leukemia) 06/23/2018   Wrist swelling, right 05/21/2018   Thoracic aortic atherosclerosis 01/08/2017   Thrombocytopenia 07/10/2016   Presence of permanent cardiac pacemaker 09/02/2015   Severe aortic stenosis 08/15/2015   Heart murmur 08/15/2015   Fracture of right inferior pubic ramus 08/09/2015   Sacral fracture 08/09/2015   Syncope 08/09/2015   Junctional rhythm 08/09/2015   Bradycardia 08/09/2015   Leukocytosis 08/09/2015   Hyperkalemia 08/09/2015   Acute renal insufficiency 08/09/2015   Essential hypertension 08/09/2015   Pelvic fracture 08/09/2015   Pelvic fracture, closed, initial encounter 08/09/2015   Pressure ulcer 08/09/2015   Atrial fibrillation with RVR 10/16/2014   Elevated troponin 10/16/2014   Atrial fibrillation 10/16/2014   GERD without esophagitis 10/16/2014   HLD (hyperlipidemia) 10/16/2014   HTN (hypertension) 10/16/2014   Anemia, unspecified 10/16/2014   S/P total hip arthroplasty 10/12/2014   B12 deficiency 12/09/2013    Palliative Care Assessment & Plan    Assessment/Recommendations/Plan  On discharge, would recommend scripts for: - Morphine Concentrate 10mg /0.54ml: 5mg  (0.82ml) sublingual every 1 hour as needed for pain or shortness of breath: Disp 38ml - Lorazepam 2mg /ml concentrated solution: 1mg  (0.46ml) sublingual every 4 hours as needed for anxiety: Disp 3ml - Haldol 2mg /ml solution: 0.5mg  (0.57ml) sublingual every 4 hours as needed for  agitation or nausea: Disp 42ml  D/C planned for home with hospice today   Code Status: DNR  Prognosis:  < 2 weeks  Discharge Planning: Home with Hospice  Care plan was discussed with family and care team.   Thank you for allowing the Palliative Medicine Team to assist in the care of this patient.   Greater than 50%  of this time was spent counseling and coordinating care related to the above assessment and plan.  Mariana Kaufman, AGNP-C Palliative Medicine   Please contact Palliative Medicine Team phone at [phone] for questions and concerns.

## 2022-09-07 LAB — CULTURE, BLOOD (ROUTINE X 2): Culture: NO GROWTH

## 2022-09-09 LAB — AEROBIC/ANAEROBIC CULTURE W GRAM STAIN (SURGICAL/DEEP WOUND)
Culture: NO GROWTH
Gram Stain: NONE SEEN
Special Requests: NORMAL

## 2022-09-11 ENCOUNTER — Inpatient Hospital Stay: Payer: 59

## 2022-10-04 DEATH — deceased

## 2022-10-09 ENCOUNTER — Inpatient Hospital Stay: Payer: 59

## 2022-11-08 ENCOUNTER — Ambulatory Visit: Payer: 59 | Admitting: Oncology

## 2022-11-08 ENCOUNTER — Ambulatory Visit: Payer: 59

## 2022-11-08 ENCOUNTER — Other Ambulatory Visit: Payer: 59
